# Patient Record
Sex: Male | Born: 1935
Health system: Southern US, Community
[De-identification: ages and names within clinical notes are randomized; demographics above are authoritative.]

## PROBLEM LIST (undated history)

## (undated) DIAGNOSIS — N189 Chronic kidney disease, unspecified: Secondary | ICD-10-CM

## (undated) DIAGNOSIS — N19 Unspecified kidney failure: Secondary | ICD-10-CM

## (undated) DIAGNOSIS — S42002A Fracture of unspecified part of left clavicle, initial encounter for closed fracture: Secondary | ICD-10-CM

## (undated) DIAGNOSIS — M1990 Unspecified osteoarthritis, unspecified site: Secondary | ICD-10-CM

## (undated) DIAGNOSIS — I447 Left bundle-branch block, unspecified: Secondary | ICD-10-CM

## (undated) DIAGNOSIS — E782 Mixed hyperlipidemia: Secondary | ICD-10-CM

## (undated) DIAGNOSIS — M419 Scoliosis, unspecified: Secondary | ICD-10-CM

## (undated) DIAGNOSIS — I509 Heart failure, unspecified: Secondary | ICD-10-CM

## (undated) DIAGNOSIS — I4891 Unspecified atrial fibrillation: Secondary | ICD-10-CM

## (undated) DIAGNOSIS — E039 Hypothyroidism, unspecified: Secondary | ICD-10-CM

## (undated) DIAGNOSIS — S93402A Sprain of unspecified ligament of left ankle, initial encounter: Secondary | ICD-10-CM

## (undated) DIAGNOSIS — J449 Chronic obstructive pulmonary disease, unspecified: Secondary | ICD-10-CM

## (undated) DIAGNOSIS — I4892 Unspecified atrial flutter: Secondary | ICD-10-CM

## (undated) HISTORY — DX: Chronic kidney disease, unspecified: N18.9

## (undated) HISTORY — DX: Scoliosis, unspecified: M41.9

## (undated) HISTORY — DX: Hypothyroidism, unspecified: E03.9

## (undated) HISTORY — PX: OTHER SURGICAL HISTORY: SHX169

## (undated) HISTORY — DX: Left bundle-branch block, unspecified: I44.7

## (undated) HISTORY — PX: EYE SURGERY: SHX253

## (undated) HISTORY — PX: TONSILLECTOMY: SUR1361

## (undated) HISTORY — DX: Unspecified atrial fibrillation: I48.91

## (undated) HISTORY — DX: Sprain of unspecified ligament of left ankle, initial encounter: S93.402A

## (undated) HISTORY — DX: Fracture of unspecified part of left clavicle, initial encounter for closed fracture: S42.002A

## (undated) HISTORY — DX: Mixed hyperlipidemia: E78.2

## (undated) HISTORY — DX: Heart failure, unspecified: I50.9

## (undated) HISTORY — DX: Unspecified kidney failure: N19

## (undated) HISTORY — DX: Unspecified atrial flutter: I48.92

---

## 1996-09-15 HISTORY — PX: OTHER SURGICAL HISTORY: SHX169

## 2001-07-15 LAB — BASIC METABOLIC PANEL
BUN: 43 — AB (ref 4–21)
CO2: 25 — AB (ref 13–22)
Chloride: 103 (ref 99–108)
Creatinine: 2.2 — AB (ref 0.6–1.3)
Glucose: 101
Potassium: 4.8 (ref 3.4–5.3)
Sodium: 140 (ref 137–147)

## 2001-07-15 LAB — CBC AND DIFFERENTIAL
HCT: 33 — AB (ref 41–53)
Hemoglobin: 10.8 — AB (ref 13.5–17.5)
Platelets: 388 (ref 150–399)
WBC: 9.4

## 2001-07-15 LAB — COMPREHENSIVE METABOLIC PANEL: Calcium: 8.3 — AB (ref 8.7–10.7)

## 2001-07-15 LAB — CBC: RBC: 3.43 — AB (ref 3.87–5.11)

## 2002-11-30 ENCOUNTER — Encounter: Payer: Self-pay | Admitting: Internal Medicine

## 2002-11-30 ENCOUNTER — Ambulatory Visit (HOSPITAL_COMMUNITY): Admission: RE | Admit: 2002-11-30 | Discharge: 2002-11-30 | Payer: Self-pay | Admitting: Internal Medicine

## 2003-08-30 ENCOUNTER — Ambulatory Visit (HOSPITAL_COMMUNITY): Admission: RE | Admit: 2003-08-30 | Discharge: 2003-08-30 | Payer: Self-pay | Admitting: Internal Medicine

## 2006-09-15 HISTORY — PX: LAPAROSCOPIC CHOLECYSTECTOMY: SUR755

## 2006-12-09 ENCOUNTER — Encounter: Payer: Self-pay | Admitting: Cardiology

## 2006-12-09 ENCOUNTER — Ambulatory Visit: Payer: Self-pay | Admitting: Cardiology

## 2006-12-09 ENCOUNTER — Inpatient Hospital Stay (HOSPITAL_COMMUNITY): Admission: EM | Admit: 2006-12-09 | Discharge: 2006-12-13 | Payer: Self-pay | Admitting: Cardiology

## 2006-12-12 ENCOUNTER — Encounter: Payer: Self-pay | Admitting: Cardiology

## 2006-12-12 ENCOUNTER — Encounter (INDEPENDENT_AMBULATORY_CARE_PROVIDER_SITE_OTHER): Payer: Self-pay | Admitting: *Deleted

## 2006-12-13 ENCOUNTER — Encounter: Payer: Self-pay | Admitting: Cardiology

## 2006-12-15 ENCOUNTER — Ambulatory Visit: Payer: Self-pay | Admitting: Internal Medicine

## 2007-04-29 ENCOUNTER — Encounter: Payer: Self-pay | Admitting: Cardiology

## 2007-06-14 ENCOUNTER — Ambulatory Visit (HOSPITAL_COMMUNITY): Admission: RE | Admit: 2007-06-14 | Discharge: 2007-06-14 | Payer: Self-pay | Admitting: Internal Medicine

## 2009-05-03 ENCOUNTER — Encounter: Payer: Self-pay | Admitting: Cardiology

## 2010-05-05 ENCOUNTER — Encounter: Payer: Self-pay | Admitting: Cardiology

## 2010-05-06 ENCOUNTER — Encounter: Payer: Self-pay | Admitting: Cardiology

## 2010-09-26 ENCOUNTER — Encounter: Payer: Self-pay | Admitting: Cardiology

## 2010-10-16 ENCOUNTER — Encounter: Payer: Self-pay | Admitting: Cardiology

## 2010-10-21 ENCOUNTER — Encounter: Payer: Self-pay | Admitting: Cardiology

## 2010-10-24 ENCOUNTER — Encounter: Payer: Self-pay | Admitting: Cardiology

## 2010-10-25 ENCOUNTER — Encounter (HOSPITAL_COMMUNITY): Payer: Self-pay

## 2010-10-25 ENCOUNTER — Other Ambulatory Visit (HOSPITAL_COMMUNITY): Payer: Self-pay | Admitting: Internal Medicine

## 2010-10-25 ENCOUNTER — Encounter: Payer: Self-pay | Admitting: Cardiology

## 2010-10-25 ENCOUNTER — Ambulatory Visit (HOSPITAL_COMMUNITY)
Admission: RE | Admit: 2010-10-25 | Discharge: 2010-10-25 | Disposition: A | Discharge status: Home / Self Care | Payer: No Typology Code available for payment source | Source: Ambulatory Visit | Attending: Internal Medicine | Admitting: Internal Medicine

## 2010-10-25 DIAGNOSIS — R0602 Shortness of breath: Secondary | ICD-10-CM

## 2010-10-25 DIAGNOSIS — R059 Cough, unspecified: Secondary | ICD-10-CM | POA: Insufficient documentation

## 2010-10-25 DIAGNOSIS — R05 Cough: Secondary | ICD-10-CM | POA: Insufficient documentation

## 2010-10-25 DIAGNOSIS — R918 Other nonspecific abnormal finding of lung field: Secondary | ICD-10-CM | POA: Insufficient documentation

## 2010-10-29 ENCOUNTER — Encounter: Payer: Self-pay | Admitting: Cardiology

## 2010-10-29 DIAGNOSIS — I509 Heart failure, unspecified: Secondary | ICD-10-CM

## 2010-11-06 NOTE — Letter (Signed)
Summary: Discharge Summary/ [facility]  Discharge Summary/ [place]   Imported By: Bartholomew Boards 10/31/2010 16:31:50  _____________________________________________________________________  External Attachment:    Type:   Image     Comment:   External Document

## 2010-11-06 NOTE — Letter (Signed)
Summary: External Correspondence/ OFFICE VISIT DR. [facility]  External Correspondence/ OFFICE VISIT DR. FAGAN   Imported By: Bartholomew Boards 10/31/2010 16:08:44  _____________________________________________________________________  External Attachment:    Type:   Image     Comment:   External Document

## 2010-11-06 NOTE — Letter (Signed)
Summary: External Correspondence/ OFFICE VISIT DR. [facility]  External Correspondence/ OFFICE VISIT DR. FAGAN   Imported By: Bartholomew Boards 10/31/2010 16:12:12  _____________________________________________________________________  External Attachment:    Type:   Image     Comment:   External Document

## 2010-11-06 NOTE — Letter (Signed)
Summary: External Correspondence/ OFFICE VISIT DR. [facility]  External Correspondence/ OFFICE VISIT DR. FAGAN   Imported By: Bartholomew Boards 10/31/2010 16:09:50  _____________________________________________________________________  External Attachment:    Type:   Image     Comment:   External Document

## 2010-11-06 NOTE — Letter (Signed)
Summary: External Correspondence/ OFFICE VISIT DR. [facility]  External Correspondence/ OFFICE VISIT DR. FAGAN   Imported By: Bartholomew Boards 10/31/2010 16:11:00  _____________________________________________________________________  External Attachment:    Type:   Image     Comment:   External Document

## 2010-11-06 NOTE — Letter (Signed)
Summary: External Correspondence/ OFFICE VISIT DR. [facility]  External Correspondence/ OFFICE VISIT DR. FAGAN   Imported By: Bartholomew Boards 10/31/2010 16:13:46  _____________________________________________________________________  External Attachment:    Type:   Image     Comment:   External Document

## 2010-11-08 ENCOUNTER — Encounter: Payer: Self-pay | Admitting: Cardiology

## 2010-11-08 ENCOUNTER — Ambulatory Visit (INDEPENDENT_AMBULATORY_CARE_PROVIDER_SITE_OTHER): Payer: No Typology Code available for payment source | Admitting: Cardiology

## 2010-11-08 DIAGNOSIS — I429 Cardiomyopathy, unspecified: Secondary | ICD-10-CM

## 2010-11-08 DIAGNOSIS — I5023 Acute on chronic systolic (congestive) heart failure: Secondary | ICD-10-CM | POA: Insufficient documentation

## 2010-11-08 DIAGNOSIS — E782 Mixed hyperlipidemia: Secondary | ICD-10-CM | POA: Insufficient documentation

## 2010-11-08 DIAGNOSIS — I447 Left bundle-branch block, unspecified: Secondary | ICD-10-CM | POA: Insufficient documentation

## 2010-11-12 ENCOUNTER — Encounter: Payer: Self-pay | Admitting: Cardiology

## 2010-11-12 NOTE — Assessment & Plan Note (Signed)
Summary: est - last saw wall in 08 per Dr. Willey Blade -srs   Visit Type:  New patient visit Primary Provider:  Dr. Asencion Noble   History of Present Illness: 75 year old male presents to establish cardiology followup. He has followed recently with Dr. Willey Blade with complaints of shortness of breath on exertion as well as lower extremity edema with weight gain. He was referred for a chest x-ray which revealed vascular congestion and a small right pleural effusion. BNP was elevated at 1030. He was started on Atacand and Lasix, as followup echocardiogram demonstrated significant left ventricular dysfunction, outlined below.  Previous echocardiogram from 2008 demonstrated LVEF of approximately 55%. It sounds as if symptoms have been present over the last 4 weeks. States that he had a "cold" around the time that things began. He is not experiencing any chest pain. He is wearing lower extremity compression stockings, and his edema is generally getting better, although has not resolved. He has NYHA class 2-3 dyspnea on exertion, using a wheelchair if he has to go any distance.  No recent followup labs.  Preventive Screening-Counseling & Management  Alcohol-Tobacco     Smoking Status: never  Current Medications (verified): 1)  Levothyroxine Sodium 150 Mcg Tabs (Levothyroxine Sodium) .... Take 1 Tablet By Mouth Once A Day 2)  Doxazosin Mesylate 2 Mg Tabs (Doxazosin Mesylate) .... Take 1 Tablet By Mouth Once A Day 3)  Multivitamins  Tabs (Multiple Vitamin) .... Take 1 Tablet By Mouth Once A Day 4)  Aspir-Low 81 Mg Tbec (Aspirin) .... Take 1 Tablet By Mouth Once A Day' 5)  Atacand 16 Mg Tabs (Candesartan Cilexetil) .... Take 1 Tablet By Mouth Once A Day 6)  Furosemide 40 Mg Tabs (Furosemide) .... Take 1 Tablet By Mouth Once A Day  Allergies (verified): No Known Drug Allergies  Past History:  Family History: Last updated: 11/08/2010 Significant for stroke in his mother who died at age 4 and diabetes and  heart disease in his father who died at age 28.  He has other siblings without obvious premature cardiovascular disease.  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  Alcohol Use - no Drug Use - no  Past Medical History: Hypothyroidism Congestive Heart Failure - dilated cardiomyopathy Hyperlipidemia Scoliosis  Past Surgical History: Laparoscopic cholecystectomy Tonsillectomy Right inguinal herniorrhaphy  Family History: Significant for stroke in his mother who died at age 64 and diabetes and heart disease in his father who died at age 69.  He has other siblings without obvious premature cardiovascular disease.  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  Alcohol Use - no Drug Use - no  Review of Systems       The patient complains of dyspnea on exertion and peripheral edema.  The patient denies anorexia, fever, weight loss, chest pain, syncope, prolonged cough, hemoptysis, melena, hematochezia, and severe indigestion/heartburn.         No palpitations. Breathlessness seems to occur in spells. No recent falls. Stable appetite. Otherwise reviewed and negative.  Vital Signs:  Patient profile:   75 year old male Height:      68 inches Weight:      188 pounds BMI:     28.69 Pulse rate:   89 / minute BP sitting:   103 / 69  (right arm) Cuff size:   regular  Vitals Entered By: Lovina Reach, LPN (February 24, [id number] 1:00 PM) Is Patient  Diabetic? No   Physical Exam  Additional Exam:  Overweight male in no acute distress. HEENT: Conjunctivae and lids normal, oropharynx clear. Neck: Supple, mildly elevated JVP, no bruits, no thyromegaly. Lungs: Clear to auscultation, diminished at the bases, no wheezing, not labored at rest. Cardiac: Indistinct PMI with distant heart sounds, no S3. Abdomen: Obese, protuberant, bowel sounds present, nontender. Skin: Warm and dry. Extremities:  2-3+ lower extremity edema, compression hose in place, distal pulses diminished. Musculoskeletal: No kyphosis. Neuropsychiatric: Alert and oriented x3.   Echocardiogram  Procedure date:  10/29/2010  Findings:      Mildly dilated LV with global hypokinesis and LVEF less than 20%. Septal dyskinesis possibly related to bundle branch block. Mild left atrial enlargement, mild mitral regurgitation with mildly thickened valve, mild aortic regurgitation, mildly dilated RV with mildly reduced function, mild right atrial enlargement, mild tricuspid regurgitation, RVSP 34 mm mercury.  CXR  Procedure date:  10/25/2010  Findings:      IMPRESSION:    1.  Increased cardiomegaly with new pulmonary vascular congestion   and a small right effusion, all suggestive of mild heart failure.   2.  Ill-defined density at the right base medially which I suspect   is vascular in origin but follow-up PA chest in 1 week is   recommended to exclude a mass.  EKG  Procedure date:  10/25/2010  Findings:      Sinus tachycardia at 102 beats per minute with incomplete left bundle branch block pattern.  Impression & Recommendations:  Problem # 1:  ACUTE ON CHRONIC SYSTOLIC HEART FAILURE (99991111)  Onset is somewhat difficult to define, however seems to be over the last month at least, perhaps in association with a recent "cold." It could be that he has a viral cardiomyopathy, as there is evidence of global hypokinesis and significant left ventricular dysfunction. I reviewed this with the patient and his family member present. Plan at this point will be to increase Lasix to 40 mg p.o. b.i.d., add a potassium supplement,and reduce Atacand to 8 mg daily so to avoid hypotension. Continue use of lower extremity compression stockings. I would eventually like to add a low-dose beta blocker once his volume status has improved. I have spoken to Dr. Willey Blade about Mr. Loose, and plan at this point will be conservative  medical therapy. We will obtain a CMET and BNP today for baseline, with followup BMET at his office visit in 2 weeks.  His updated medication list for this problem includes:    Aspir-low 81 Mg Tbec (Aspirin) .[name]... Take 1 tablet by mouth once a day'    Atacand 16 Mg Tabs (Candesartan cilexetil) .[name]... Take 1/2 tablet by mouth once a day    Furosemide 40 Mg Tabs (Furosemide) .[name]... Take 1 tablet by mouth two times a day  Problem # 2:  CARDIOMYOPATHY, SECONDARY (ICD-425.9)  Dilated, probably nonischemic cardiomyopathy.  His updated medication list for this problem includes:    Aspir-low 81 Mg Tbec (Aspirin) .[name]... Take 1 tablet by mouth once a day'    Atacand 16 Mg Tabs (Candesartan cilexetil) .[name]... Take 1/2 tablet by mouth once a day    Furosemide 40 Mg Tabs (Furosemide) .[name]... Take 1 tablet by mouth two times a day  His updated medication list for this problem includes:    Aspir-low 81 Mg Tbec (Aspirin) .[name]... Take 1 tablet by mouth once a day'    Atacand 16 Mg Tabs (Candesartan cilexetil) .[name]... Take 1/2 tablet by mouth once a day  Furosemide 40 Mg Tabs (Furosemide) .[name]... Take 1 tablet by mouth two times a day  Problem # 3:  LBBB (ICD-426.3)  Septal dyssynergy noted, consistent with bundle branch block.  His updated medication list for this problem includes:    Aspir-low 81 Mg Tbec (Aspirin) .[name]... Take 1 tablet by mouth once a day'  Problem # 4:  MIXED HYPERLIPIDEMIA (ICD-272.2)  Based on history reviewed. Plan to followup fasting lipid profile eventually.  Other Orders: T-Comprehensive Metabolic Panel (A999333) T-BNP  (B Natriuretic Peptide) (0000000) T-Basic Metabolic Panel (99991111) T-BNP  (B Natriuretic Peptide) [id number])  Patient Instructions: 1)  Follow up with Dr. Domenic Polite on  [place], November 22, 2010 AT 8:40AM. 2)  Decrease Atacand to 1/2 tablet once daily.  3)  Increase Lasix (furosemide) to 40mg  by mouth two times a day. 4)  Start Potassium 10 mEq by mouth  once daily. 5)  Your physician recommends that you go to [facility] for lab work: DO [place] 2 WEEKS BEFORE APPT. Prescriptions: POTASSIUM CHLORIDE CR 10 MEQ CR-CAPS (POTASSIUM CHLORIDE) Take one tablet by mouth daily  #30 x 6   Entered by:   Gurney Maxin, RN, BSN   Authorized by:   Beckie Salts, MD, [facility]   Signed by:   Gurney Maxin, RN, BSN on 11/08/2010   Method used:   Electronically to        [facility] (retail)       [address], [place]  [address]       Ph: [phone]       Fax: [phone]   RxID:   [id number] FUROSEMIDE 40 MG TABS (FUROSEMIDE) Take 1 tablet by mouth two times a day  #60 x 6   Entered by:   Gurney Maxin, RN, BSN   Authorized by:   Beckie Salts, MD, [facility]   Signed by:   Gurney Maxin, RN, BSN on 11/08/2010   Method used:   Electronically to        [facility] (retail)       [address]       [place], [place]  [address]       Ph: [phone]       Fax: [phone]   RxID:   [id number]

## 2010-11-12 NOTE — Op Note (Signed)
Summary: Operative Report/ DORA BRODIE  Operative Report/ DORA BRODIE   Imported By: Bartholomew Boards 11/08/2010 10:35:59  _____________________________________________________________________  External Attachment:    Type:   Image     Comment:   External Document

## 2010-11-12 NOTE — Letter (Signed)
Summary: External Correspondence/ [place]  External Correspondence/ [place]   Imported By: Bartholomew Boards 11/08/2010 10:40:14  _____________________________________________________________________  External Attachment:    Type:   Image     Comment:   External Document

## 2010-11-12 NOTE — Op Note (Signed)
Summary: Operative Report/  DR. Margot Chimes  Operative Report/  DR. Margot Chimes   Imported By: Bartholomew Boards 11/08/2010 10:37:32  _____________________________________________________________________  External Attachment:    Type:   Image     Comment:   External Document

## 2010-11-12 NOTE — Consult Note (Signed)
Summary: Consultation Report/ [facility]  Consultation Report/ [facility]   Imported By: Bartholomew Boards 11/08/2010 10:31:49  _____________________________________________________________________  External Attachment:    Type:   Image     Comment:   External Document

## 2010-11-13 ENCOUNTER — Encounter: Payer: Self-pay | Admitting: Cardiology

## 2010-11-13 DIAGNOSIS — I5023 Acute on chronic systolic (congestive) heart failure: Secondary | ICD-10-CM

## 2010-11-14 ENCOUNTER — Encounter: Payer: Self-pay | Admitting: Cardiology

## 2010-11-17 ENCOUNTER — Encounter: Payer: Self-pay | Admitting: Cardiology

## 2010-11-20 ENCOUNTER — Encounter: Payer: Self-pay | Admitting: Cardiology

## 2010-11-21 ENCOUNTER — Encounter: Payer: Self-pay | Admitting: Cardiology

## 2010-11-22 ENCOUNTER — Encounter: Payer: Self-pay | Admitting: Cardiology

## 2010-11-22 ENCOUNTER — Ambulatory Visit: Payer: No Typology Code available for payment source | Admitting: Cardiology

## 2010-11-26 NOTE — Consult Note (Signed)
Summary: [place] NOTE-DR.DEGENT  [place] NOTE-DR.DEGENT   Imported By: Delfino Lovett 11/21/2010 16:38:01  _____________________________________________________________________  External Attachment:    Type:   Image     Comment:   External Document

## 2010-11-26 NOTE — Consult Note (Signed)
Summary: CARDIOLOGY CONSULT/ [place] [facility]/ [place]   Imported By: Delfino Lovett 11/21/2010 16:36:14  _____________________________________________________________________  External Attachment:    Type:   Image     Comment:   External Document

## 2010-11-28 ENCOUNTER — Telehealth: Payer: Self-pay | Admitting: *Deleted

## 2010-11-28 ENCOUNTER — Encounter: Payer: Self-pay | Admitting: Cardiology

## 2010-12-02 ENCOUNTER — Encounter: Payer: Self-pay | Admitting: Cardiology

## 2010-12-02 ENCOUNTER — Telehealth: Payer: Self-pay | Admitting: *Deleted

## 2010-12-02 ENCOUNTER — Encounter (INDEPENDENT_AMBULATORY_CARE_PROVIDER_SITE_OTHER): Payer: No Typology Code available for payment source | Admitting: Cardiology

## 2010-12-02 DIAGNOSIS — I5022 Chronic systolic (congestive) heart failure: Secondary | ICD-10-CM

## 2010-12-02 DIAGNOSIS — N289 Disorder of kidney and ureter, unspecified: Secondary | ICD-10-CM

## 2010-12-03 NOTE — Progress Notes (Signed)
Summary: LAB ORDERS  Phone Note From Other Clinic Call back at Home Phone [phone]   Caller: Janace Hoard WITH ADVANCED Summary of Call: ANGIE WILL BE GOING TO PATIENT'S HOME TODAY AND WOULD LIKE LAB ORDER CHANGED SO THAT SHE CAN DRAW TODAY INSTEAD OF TOMORROW.[name]  [place] # [phone] Initial call taken by: Cher Nakai,  November 28, 2010 8:04 AM  Follow-up for Phone Call        Angie notified labs can be drawn at visit today.  Follow-up by: Gurney Maxin, RN, BSN,  November 28, 2010 10:28 AM

## 2010-12-06 ENCOUNTER — Encounter: Payer: Self-pay | Admitting: Cardiology

## 2010-12-08 ENCOUNTER — Encounter: Payer: Self-pay | Admitting: Cardiology

## 2010-12-09 ENCOUNTER — Inpatient Hospital Stay (HOSPITAL_COMMUNITY): Payer: No Typology Code available for payment source

## 2010-12-09 ENCOUNTER — Inpatient Hospital Stay (HOSPITAL_COMMUNITY)
Admission: AD | Admit: 2010-12-09 | Discharge: 2010-12-17 | DRG: 286 | Disposition: A | Discharge status: Home / Self Care | Payer: No Typology Code available for payment source | Source: Ambulatory Visit | Attending: Cardiology | Admitting: Cardiology

## 2010-12-09 DIAGNOSIS — I428 Other cardiomyopathies: Secondary | ICD-10-CM | POA: Diagnosis present

## 2010-12-09 DIAGNOSIS — I509 Heart failure, unspecified: Secondary | ICD-10-CM | POA: Diagnosis present

## 2010-12-09 DIAGNOSIS — Z7982 Long term (current) use of aspirin: Secondary | ICD-10-CM

## 2010-12-09 DIAGNOSIS — I5023 Acute on chronic systolic (congestive) heart failure: Secondary | ICD-10-CM

## 2010-12-09 DIAGNOSIS — R5381 Other malaise: Secondary | ICD-10-CM | POA: Diagnosis present

## 2010-12-09 DIAGNOSIS — I4891 Unspecified atrial fibrillation: Secondary | ICD-10-CM | POA: Diagnosis present

## 2010-12-09 DIAGNOSIS — R570 Cardiogenic shock: Secondary | ICD-10-CM | POA: Diagnosis present

## 2010-12-09 DIAGNOSIS — I2789 Other specified pulmonary heart diseases: Secondary | ICD-10-CM | POA: Diagnosis present

## 2010-12-09 DIAGNOSIS — I498 Other specified cardiac arrhythmias: Secondary | ICD-10-CM | POA: Diagnosis present

## 2010-12-09 DIAGNOSIS — F0390 Unspecified dementia without behavioral disturbance: Secondary | ICD-10-CM | POA: Diagnosis present

## 2010-12-09 DIAGNOSIS — N183 Chronic kidney disease, stage 3 unspecified: Secondary | ICD-10-CM | POA: Diagnosis present

## 2010-12-09 DIAGNOSIS — I447 Left bundle-branch block, unspecified: Secondary | ICD-10-CM | POA: Diagnosis present

## 2010-12-09 DIAGNOSIS — E039 Hypothyroidism, unspecified: Secondary | ICD-10-CM | POA: Diagnosis present

## 2010-12-09 LAB — CBC
HCT: 40 % (ref 39.0–52.0)
Hemoglobin: 13.3 g/dL (ref 13.0–17.0)
MCH: 30.4 pg (ref 26.0–34.0)
MCHC: 33.3 g/dL (ref 30.0–36.0)
MCV: 91.5 fL (ref 78.0–100.0)
Platelets: 213 10*3/uL (ref 150–400)
RBC: 4.37 MIL/uL (ref 4.22–5.81)
RDW: 15.1 % (ref 11.5–15.5)
WBC: 8.2 10*3/uL (ref 4.0–10.5)

## 2010-12-09 LAB — CARBOXYHEMOGLOBIN
Carboxyhemoglobin: 0.8 % (ref 0.5–1.5)
Methemoglobin: 0.6 % (ref 0.0–1.5)
O2 Saturation: 41.6 %
Total hemoglobin: 12.8 g/dL — ABNORMAL LOW (ref 13.5–18.0)

## 2010-12-09 LAB — COMPREHENSIVE METABOLIC PANEL
ALT: 53 U/L (ref 0–53)
AST: 37 U/L (ref 0–37)
Albumin: 3.6 g/dL (ref 3.5–5.2)
Alkaline Phosphatase: 82 U/L (ref 39–117)
BUN: 70 mg/dL — ABNORMAL HIGH (ref 6–23)
CO2: 25 mEq/L (ref 19–32)
Calcium: 8.4 mg/dL (ref 8.4–10.5)
Chloride: 95 mEq/L — ABNORMAL LOW (ref 96–112)
Creatinine, Ser: 1.91 mg/dL — ABNORMAL HIGH (ref 0.4–1.5)
GFR calc Af Amer: 42 mL/min — ABNORMAL LOW (ref >60)
GFR calc non Af Amer: 35 mL/min — ABNORMAL LOW (ref >60)
Glucose, Bld: 110 mg/dL — ABNORMAL HIGH (ref 70–99)
Potassium: 4.6 mEq/L (ref 3.5–5.1)
Sodium: 131 mEq/L — ABNORMAL LOW (ref 135–145)
Total Bilirubin: 0.9 mg/dL (ref 0.3–1.2)
Total Protein: 7.3 g/dL (ref 6.0–8.3)

## 2010-12-09 LAB — BRAIN NATRIURETIC PEPTIDE: Pro B Natriuretic peptide (BNP): 985 pg/mL — ABNORMAL HIGH (ref 0.0–100.0)

## 2010-12-09 LAB — MRSA PCR SCREENING: MRSA by PCR: NEGATIVE

## 2010-12-10 LAB — BASIC METABOLIC PANEL
BUN: 61 mg/dL — ABNORMAL HIGH (ref 6–23)
CO2: 25 mEq/L (ref 19–32)
Calcium: 8.5 mg/dL (ref 8.4–10.5)
Chloride: 98 mEq/L (ref 96–112)
Creatinine, Ser: 1.6 mg/dL — ABNORMAL HIGH (ref 0.4–1.5)
GFR calc Af Amer: 52 mL/min — ABNORMAL LOW (ref >60)
GFR calc non Af Amer: 43 mL/min — ABNORMAL LOW (ref >60)
Glucose, Bld: 121 mg/dL — ABNORMAL HIGH (ref 70–99)
Potassium: 3.9 mEq/L (ref 3.5–5.1)
Sodium: 135 mEq/L (ref 135–145)

## 2010-12-10 LAB — CARBOXYHEMOGLOBIN
Carboxyhemoglobin: 1 % (ref 0.5–1.5)
Methemoglobin: 0.4 % (ref 0.0–1.5)
O2 Saturation: 56.5 %
Total hemoglobin: 13 g/dL — ABNORMAL LOW (ref 13.5–18.0)

## 2010-12-11 LAB — BASIC METABOLIC PANEL
BUN: 43 mg/dL — ABNORMAL HIGH (ref 6–23)
CO2: 31 mEq/L (ref 19–32)
Calcium: 8.1 mg/dL — ABNORMAL LOW (ref 8.4–10.5)
Chloride: 100 mEq/L (ref 96–112)
Creatinine, Ser: 1.62 mg/dL — ABNORMAL HIGH (ref 0.4–1.5)
GFR calc Af Amer: 51 mL/min — ABNORMAL LOW (ref >60)
GFR calc non Af Amer: 42 mL/min — ABNORMAL LOW (ref >60)
Glucose, Bld: 119 mg/dL — ABNORMAL HIGH (ref 70–99)
Potassium: 3.7 mEq/L (ref 3.5–5.1)
Sodium: 138 mEq/L (ref 135–145)

## 2010-12-12 LAB — BASIC METABOLIC PANEL
BUN: 31 mg/dL — ABNORMAL HIGH (ref 6–23)
CO2: 29 mEq/L (ref 19–32)
Calcium: 8.6 mg/dL (ref 8.4–10.5)
Creatinine, Ser: 1.47 mg/dL (ref 0.4–1.5)
Glucose, Bld: 106 mg/dL — ABNORMAL HIGH (ref 70–99)
Sodium: 137 mEq/L (ref 135–145)

## 2010-12-12 LAB — CBC
HCT: 40.5 % (ref 39.0–52.0)
MCH: 29.4 pg (ref 26.0–34.0)
MCHC: 32.3 g/dL (ref 30.0–36.0)
MCV: 91 fL (ref 78.0–100.0)
RDW: 15.1 % (ref 11.5–15.5)

## 2010-12-12 LAB — PROTIME-INR
INR: 1.15 (ref 0.00–1.49)
Prothrombin Time: 14.9 seconds (ref 11.6–15.2)

## 2010-12-12 LAB — CARBOXYHEMOGLOBIN: O2 Saturation: 63.2 %

## 2010-12-12 NOTE — Assessment & Plan Note (Signed)
SummaryGillian Bowen [facility] 3/9   Visit Type:  Follow-up Primary Provider:  Dr. Asencion Noble   History of Present Illness: 75 year old male presents for followup. Since I last saw him, he was admitted to [facility] with acute on chronic congestive heart failure associated with volume overload and pleural effusion. He required thoracentesis along with medication adjustments including temporary inotropic therapy. Weight changed from 179 pounds at admission down to 165 pounds at discharge.  Followup lab work from 15 March showed BUN 31, creatinine 1.4, sodium 132, potassium 4.0, BNP 809.  He still has a right-sided PICC line in place, home health nursing is following. He is here with a family member who keeps a close watch on him. Reports compliance with medications, relatively stable weights, reported at "164 " prior to dressing this morning.  We discussed sodium restriction and fluid restriction, following weights daily, also medication compliance. His peripheral edema is significantly improved although not resolved. He still uses compression stockings. No palpitations or syncope.  Current Medications (verified): 1)  Levothyroxine Sodium 150 Mcg Tabs (Levothyroxine Sodium) .... Take 1 Tablet By Mouth Once A Day 2)  Aspir-Low 81 Mg Tbec (Aspirin) .... Take 1 Tablet By Mouth Once A Day' 3)  Furosemide 80 Mg Tabs (Furosemide) .... Take 1 Tablet By Mouth Two Times A Day 4)  Potassium Chloride Crys Cr 20 Meq Cr-Tabs (Potassium Chloride Crys Cr) .... Take 1 Tablet By Mouth Once A Day 5)  Diovan 40 Mg Tabs (Valsartan) .... Take 1/2 Tablet By Mouth Two Times A Day  Allergies (verified): No Known Drug Allergies  Comments:  Nurse/Medical Assistant: The patient's medication bottles and allergies were reviewed with the patient and were updated in the Medication and Allergy Lists.  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   Alcohol Use - no Drug Use - no  Past Medical History: Hypothyroidism Congestive Heart Failure - dilated cardiomyopathy Hyperlipidemia Scoliosis Atrial Fibrillation/Flutter - suboptimal Coumadin candidate, fall risk Renal insufficiency  Review of Systems       The patient complains of dyspnea on exertion and peripheral edema.  The patient denies anorexia, fever, weight gain, chest pain, syncope, melena, hematochezia, and severe indigestion/heartburn.         Otherwise reviewed and negative except as outlined.  Vital Signs:  Patient profile:   75 year old male Height:      68 inches Weight:      168 pounds O2 Sat:      100 % on Room air Pulse rate:   83 / minute BP sitting:   112 / 76  (left arm) Cuff size:   regular  Vitals Entered By: Georgina Peer (December 02, 2010 10:47 AM)  O2 Flow:  Room air  Physical Exam  Additional Exam:  Overweight male in no acute distress. HEENT: Conjunctivae and lids normal, oropharynx clear. Neck: Supple, mildly elevated JVP, no bruits, no thyromegaly. Lungs: Clear to auscultation, diminished at the bases, no wheezing, not labored at rest. Cardiac: Indistinct PMI with distant heart sounds, no S3. Abdomen: Obese, protuberant, bowel sounds present, nontender. Skin: Warm and dry. Extremities: 2+ lower extremity edema, compression hose in place, distal pulses diminished. Musculoskeletal: Significant kyphosis. Neuropsychiatric: Alert and oriented x3.   CXR  Procedure date:  11/20/2010  Findings:      Resolution of CHF with small residual right pleural effusion status post thoracentesis.  Impression & Recommendations:  Problem # 1:  CHRONIC SYSTOLIC HEART FAILURE (123456)  Symptomatically improved, down 20 pounds compared to his original office visit following diuresis and right-sided thoracentesis. Hospital evaluation was reviewed. Patient's renal function and potassium are stable on high dose Lasix. He has home health nursing  followup at this point, and I have discussed the importance of daily weights, sodium and fluid restriction, and medication compliance. BNP has increased somewhat compared to his discharge, although with stable weights and improving lower examination edema, plan to continue present diuretic regimen. I would like to add low-dose Coreg beginning at 3.125 mg p.o. b.i.d. since volume status has improved. A 2 week visit is scheduled. I do not anticipate long-term use of the PICC line, as home inotropic therapy is not being considered at this point. He may well be able to have this line removed over the next week.  The following medications were removed from the medication list:    Atacand 16 Mg Tabs (Candesartan cilexetil) .[name]... Take 1/2 tablet by mouth once a day His updated medication list for this problem includes:    Aspir-low 81 Mg Tbec (Aspirin) .[name]... Take 1 tablet by mouth once a day'    Furosemide 80 Mg Tabs (Furosemide) .[name]... Take 1 tablet by mouth two times a day    Diovan 40 Mg Tabs (Valsartan) .[name]... Take 1/2 tablet by mouth two times a day    Carvedilol 3.125 Mg Tabs (Carvedilol) .[name]... Take one tablet by mouth twice a day  Problem # 2:  CARDIOMYOPATHY, SECONDARY (ICD-425.9)  Probably nonischemic, anticipate conservative medical therapy at this time.  The following medications were removed from the medication list:    Atacand 16 Mg Tabs (Candesartan cilexetil) .[name]... Take 1/2 tablet by mouth once a day His updated medication list for this problem includes:    Aspir-low 81 Mg Tbec (Aspirin) .[name]... Take 1 tablet by mouth once a day'    Furosemide 80 Mg Tabs (Furosemide) .[name]... Take 1 tablet by mouth two times a day    Diovan 40 Mg Tabs (Valsartan) .[name]... Take 1/2 tablet by mouth two times a day    Carvedilol 3.125 Mg Tabs (Carvedilol) .[name]... Take one tablet by mouth twice a day  Patient Instructions: 1)  Follow up with Dr. Domenic Polite on Wed, December 18, 2010 at 1:20pm. 2)  Start Coreg (carvedilol)  3.125mg  two times a day. Prescriptions: CARVEDILOL 3.125 MG TABS (CARVEDILOL) Take one tablet by mouth twice a day  #60 x 6   Entered by:   Gurney Maxin, RN, BSN   Authorized by:   Beckie Salts, MD, [facility]   Signed by:   Gurney Maxin, RN, BSN on 12/02/2010   Method used:   Electronically to        [facility] (retail)       [address]       [place], [place]  [address]       Ph: [phone]       Fax: [phone]   RxID:   [id number]   Handout requested.

## 2010-12-12 NOTE — Letter (Signed)
Summary: [place] D/C DR. Roselind Rily  [place] D/C DR. Roselind Rily   Imported By: Delfino Lovett 12/02/2010 08:31:20  _____________________________________________________________________  External Attachment:    Type:   Image     Comment:   External Document

## 2010-12-12 NOTE — Consult Note (Signed)
Summary: CARDIOLOGY CONSULT/ [place] [facility]/ [place]   Imported By: Delfino Lovett 12/02/2010 08:46:21  _____________________________________________________________________  External Attachment:    Type:   Image     Comment:   External Document

## 2010-12-12 NOTE — Medication Information (Signed)
Summary: [place] D/C MEDICATION SHEET  [place] D/C MEDICATION SHEET   Imported By: Delfino Lovett 12/02/2010 08:50:20  _____________________________________________________________________  External Attachment:    Type:   Image     Comment:   External Document

## 2010-12-12 NOTE — Progress Notes (Signed)
Summary: Home Health  Phone Note Outgoing Call   Call placed by: Gurney Maxin, RN, BSN,  December 02, 2010 11:41 AM Summary of Call: Spoke with Lattie Haw at [facility]. Notified her that we would like nurse to contact us in 1 week to give an update as to the pt's status. Per Dr. Domenic Polite, if pt remains stable, we will set up for PICC line to be removed. Initial call taken by: Gurney Maxin, RN, BSN,  December 02, 2010 11:44 AM

## 2010-12-13 DIAGNOSIS — I4891 Unspecified atrial fibrillation: Secondary | ICD-10-CM

## 2010-12-13 DIAGNOSIS — I509 Heart failure, unspecified: Secondary | ICD-10-CM

## 2010-12-13 LAB — HEPARIN LEVEL (UNFRACTIONATED)
Heparin Unfractionated: 0.42 IU/mL (ref 0.30–0.70)
Heparin Unfractionated: 0.32 IU/mL (ref 0.30–0.70)

## 2010-12-13 LAB — POCT I-STAT 3, ART BLOOD GAS (G3+)
Acid-Base Excess: 4 mmol/L — ABNORMAL HIGH (ref 0.0–2.0)
O2 Saturation: 96 %

## 2010-12-13 LAB — CBC
MCH: 29.9 pg (ref 26.0–34.0)
MCHC: 32.2 g/dL (ref 30.0–36.0)
Platelets: 187 10*3/uL (ref 150–400)
RBC: 4.12 MIL/uL — ABNORMAL LOW (ref 4.22–5.81)

## 2010-12-13 LAB — POCT I-STAT 3, VENOUS BLOOD GAS (G3P V)
Bicarbonate: 27.5 mEq/L — ABNORMAL HIGH (ref 20.0–24.0)
pCO2, Ven: 44.5 mmHg — ABNORMAL LOW (ref 45.0–50.0)
pH, Ven: 7.405 — ABNORMAL HIGH (ref 7.250–7.300)
pH, Ven: 7.407 — ABNORMAL HIGH (ref 7.250–7.300)
pO2, Ven: 33 mmHg (ref 30.0–45.0)

## 2010-12-13 LAB — BASIC METABOLIC PANEL
CO2: 27 mEq/L (ref 19–32)
Calcium: 8.6 mg/dL (ref 8.4–10.5)
Creatinine, Ser: 1.57 mg/dL — ABNORMAL HIGH (ref 0.4–1.5)
GFR calc Af Amer: 53 mL/min — ABNORMAL LOW (ref >60)
Glucose, Bld: 104 mg/dL — ABNORMAL HIGH (ref 70–99)

## 2010-12-14 LAB — PROTIME-INR
INR: 1.18 (ref 0.00–1.49)
Prothrombin Time: 15.2 seconds (ref 11.6–15.2)

## 2010-12-14 LAB — CBC
HCT: 36.4 % — ABNORMAL LOW (ref 39.0–52.0)
Hemoglobin: 11.4 g/dL — ABNORMAL LOW (ref 13.0–17.0)
RDW: 15.6 % — ABNORMAL HIGH (ref 11.5–15.5)
WBC: 6.3 10*3/uL (ref 4.0–10.5)

## 2010-12-14 LAB — BASIC METABOLIC PANEL
BUN: 28 mg/dL — ABNORMAL HIGH (ref 6–23)
Calcium: 8.5 mg/dL (ref 8.4–10.5)
GFR calc non Af Amer: 42 mL/min — ABNORMAL LOW (ref >60)
Potassium: 4.7 mEq/L (ref 3.5–5.1)
Sodium: 136 mEq/L (ref 135–145)

## 2010-12-14 LAB — HEPARIN LEVEL (UNFRACTIONATED): Heparin Unfractionated: 0.36 IU/mL (ref 0.30–0.70)

## 2010-12-15 LAB — CBC
HCT: 34.8 % — ABNORMAL LOW (ref 39.0–52.0)
MCH: 29.2 pg (ref 26.0–34.0)
MCHC: 31.6 g/dL (ref 30.0–36.0)
MCV: 92.3 fL (ref 78.0–100.0)
Platelets: 140 10*3/uL — ABNORMAL LOW (ref 150–400)
RDW: 15.6 % — ABNORMAL HIGH (ref 11.5–15.5)
WBC: 6.2 10*3/uL (ref 4.0–10.5)

## 2010-12-15 LAB — BASIC METABOLIC PANEL
BUN: 29 mg/dL — ABNORMAL HIGH (ref 6–23)
Chloride: 100 mEq/L (ref 96–112)
Creatinine, Ser: 1.57 mg/dL — ABNORMAL HIGH (ref 0.4–1.5)
Glucose, Bld: 106 mg/dL — ABNORMAL HIGH (ref 70–99)

## 2010-12-15 LAB — CARBOXYHEMOGLOBIN
Methemoglobin: 0.7 % (ref 0.0–1.5)
Total hemoglobin: 11.1 g/dL — ABNORMAL LOW (ref 13.5–18.0)

## 2010-12-15 LAB — HEPARIN LEVEL (UNFRACTIONATED): Heparin Unfractionated: 0.48 IU/mL (ref 0.30–0.70)

## 2010-12-15 LAB — PROTIME-INR: Prothrombin Time: 16.7 seconds — ABNORMAL HIGH (ref 11.6–15.2)

## 2010-12-16 LAB — CARBOXYHEMOGLOBIN
Carboxyhemoglobin: 1.6 % — ABNORMAL HIGH (ref 0.5–1.5)
Methemoglobin: 0.6 % (ref 0.0–1.5)
Methemoglobin: 0.4 % (ref 0.0–1.5)
O2 Saturation: 95.1 %
Total hemoglobin: 11.4 g/dL — ABNORMAL LOW (ref 13.5–18.0)

## 2010-12-16 LAB — CBC
HCT: 34.6 % — ABNORMAL LOW (ref 39.0–52.0)
Hemoglobin: 11 g/dL — ABNORMAL LOW (ref 13.0–17.0)
MCV: 93.3 fL (ref 78.0–100.0)
RBC: 3.71 MIL/uL — ABNORMAL LOW (ref 4.22–5.81)
RDW: 15.7 % — ABNORMAL HIGH (ref 11.5–15.5)
WBC: 7.1 10*3/uL (ref 4.0–10.5)

## 2010-12-16 LAB — PROTIME-INR
INR: 1.69 — ABNORMAL HIGH (ref 0.00–1.49)
Prothrombin Time: 20.1 seconds — ABNORMAL HIGH (ref 11.6–15.2)

## 2010-12-16 LAB — BASIC METABOLIC PANEL
BUN: 27 mg/dL — ABNORMAL HIGH (ref 6–23)
Creatinine, Ser: 1.5 mg/dL (ref 0.4–1.5)
GFR calc non Af Amer: 46 mL/min — ABNORMAL LOW (ref >60)
Glucose, Bld: 110 mg/dL — ABNORMAL HIGH (ref 70–99)
Potassium: 4.2 mEq/L (ref 3.5–5.1)

## 2010-12-16 LAB — HEPARIN LEVEL (UNFRACTIONATED): Heparin Unfractionated: 0.38 IU/mL (ref 0.30–0.70)

## 2010-12-17 ENCOUNTER — Encounter: Payer: Self-pay | Admitting: Cardiology

## 2010-12-17 LAB — CARBOXYHEMOGLOBIN
Carboxyhemoglobin: 1.5 % (ref 0.5–1.5)
Methemoglobin: 0.7 % (ref 0.0–1.5)

## 2010-12-17 LAB — BASIC METABOLIC PANEL
Calcium: 8.2 mg/dL — ABNORMAL LOW (ref 8.4–10.5)
GFR calc Af Amer: 58 mL/min — ABNORMAL LOW (ref >60)
GFR calc non Af Amer: 48 mL/min — ABNORMAL LOW (ref >60)
Potassium: 4.3 mEq/L (ref 3.5–5.1)
Sodium: 135 mEq/L (ref 135–145)

## 2010-12-17 LAB — PROTIME-INR
INR: 2.68 — ABNORMAL HIGH (ref 0.00–1.49)
Prothrombin Time: 28.6 seconds — ABNORMAL HIGH (ref 11.6–15.2)

## 2010-12-17 LAB — CBC
Hemoglobin: 10.7 g/dL — ABNORMAL LOW (ref 13.0–17.0)
MCHC: 31.6 g/dL (ref 30.0–36.0)
RDW: 15.7 % — ABNORMAL HIGH (ref 11.5–15.5)
WBC: 7.6 10*3/uL (ref 4.0–10.5)

## 2010-12-18 ENCOUNTER — Ambulatory Visit: Payer: No Typology Code available for payment source | Admitting: Cardiology

## 2010-12-19 ENCOUNTER — Other Ambulatory Visit: Payer: Self-pay | Admitting: Internal Medicine

## 2010-12-19 ENCOUNTER — Ambulatory Visit (INDEPENDENT_AMBULATORY_CARE_PROVIDER_SITE_OTHER): Payer: Self-pay | Admitting: Internal Medicine

## 2010-12-19 DIAGNOSIS — I429 Cardiomyopathy, unspecified: Secondary | ICD-10-CM

## 2010-12-19 DIAGNOSIS — I5022 Chronic systolic (congestive) heart failure: Secondary | ICD-10-CM

## 2010-12-19 DIAGNOSIS — I4892 Unspecified atrial flutter: Secondary | ICD-10-CM | POA: Insufficient documentation

## 2010-12-19 DIAGNOSIS — Z7901 Long term (current) use of anticoagulants: Secondary | ICD-10-CM

## 2010-12-19 LAB — POCT INR: INR: 2.2

## 2010-12-19 MED ORDER — VALSARTAN 40 MG PO TABS: ORAL_TABLET | ORAL | Status: DC

## 2010-12-23 ENCOUNTER — Encounter: Payer: Self-pay | Admitting: Cardiology

## 2010-12-23 ENCOUNTER — Ambulatory Visit (INDEPENDENT_AMBULATORY_CARE_PROVIDER_SITE_OTHER): Payer: Self-pay | Admitting: Cardiology

## 2010-12-23 DIAGNOSIS — I4891 Unspecified atrial fibrillation: Secondary | ICD-10-CM

## 2010-12-23 NOTE — Patient Instructions (Signed)
Continue 3mg s daily. Recheck on Monday.  Home Health nurse given instructions for redraw on 12/30/10 and dosing instructions.  [place] nurse to call patient's brother with updated information.

## 2010-12-26 ENCOUNTER — Ambulatory Visit (INDEPENDENT_AMBULATORY_CARE_PROVIDER_SITE_OTHER): Payer: No Typology Code available for payment source | Admitting: Internal Medicine

## 2010-12-26 ENCOUNTER — Encounter: Payer: Self-pay | Admitting: Internal Medicine

## 2010-12-26 VITALS — BP 106/58 | HR 53 | Resp 18 | Ht 66.0 in | Wt 159.4 lb

## 2010-12-26 DIAGNOSIS — I4892 Unspecified atrial flutter: Secondary | ICD-10-CM

## 2010-12-26 DIAGNOSIS — I5022 Chronic systolic (congestive) heart failure: Secondary | ICD-10-CM

## 2010-12-26 LAB — CBC WITH DIFFERENTIAL/PLATELET
Basophils Absolute: 0.1 10*3/uL (ref 0.0–0.1)
Eosinophils Absolute: 0.3 10*3/uL (ref 0.0–0.7)
MCHC: 33.5 g/dL (ref 30.0–36.0)
MCV: 92.7 fl (ref 78.0–100.0)
Monocytes Absolute: 0.8 10*3/uL (ref 0.1–1.0)
Neutrophils Relative %: 66 % (ref 43.0–77.0)
Platelets: 307 10*3/uL (ref 150.0–400.0)
RDW: 17.3 % — ABNORMAL HIGH (ref 11.5–14.6)
WBC: 6.8 10*3/uL (ref 4.5–10.5)

## 2010-12-26 LAB — BASIC METABOLIC PANEL WITH GFR
BUN: 35 mg/dL — ABNORMAL HIGH (ref 6–23)
CO2: 28 meq/L (ref 19–32)
Calcium: 8.7 mg/dL (ref 8.4–10.5)
Chloride: 96 meq/L (ref 96–112)
Creatinine, Ser: 1.9 mg/dL — ABNORMAL HIGH (ref 0.4–1.5)
GFR: 38.34 mL/min — ABNORMAL LOW
Glucose, Bld: 75 mg/dL (ref 70–99)
Potassium: 4.7 meq/L (ref 3.5–5.1)
Sodium: 138 meq/L (ref 135–145)

## 2010-12-26 LAB — BRAIN NATRIURETIC PEPTIDE: Pro B Natriuretic peptide (BNP): 1490.7 pg/mL — ABNORMAL HIGH (ref 0.0–100.0)

## 2010-12-26 MED ORDER — VALSARTAN 40 MG PO TABS: 40.0000 mg | ORAL_TABLET | Freq: Two times a day (BID) | ORAL | Status: DC

## 2010-12-26 NOTE — Assessment & Plan Note (Addendum)
Overall much improved but still with some edema. Weight coming down nicely on current diuretic regimen so will not titrate up. Will increase Diovan to 40 BID. Check labs today. HR too low to increase b-blocker. Will change stockings to thigh-high and may even need to consider wrapping L leg due to venous insufficiency at [facility]. I suspect we will need to consider device therapy at least to permit b-blocker titration and resynchronize. Long talk with patient and brother [name] about situation.

## 2010-12-26 NOTE — Assessment & Plan Note (Signed)
Maintaining SR. Continue amiodarone and coumadin.

## 2010-12-26 NOTE — Progress Notes (Signed)
HPI:  [name] is a 75 y/o male with h/o cognitive impairment, renal insufficiency, severe CHF due to NICM (EF 15-20%), atrial flutter (s/p DC-CV in April 2012) and LBBB.   He was recently hospitalized with low-output HF and volume overload. Also found to be in atrial flutter. Was treated with milrinone and IV lasix and also underwent RHC and DC-CV. (we discussed ablation but given low output - we opted for DC-CV initially).   Coronary angiograms showed insignifcant CAD. Right heart cath (on milrinone) showed a right atrial pressure of about 12, PA pressure was 45/79 with a mean of 27, pulmonary capillary wedge pressure was 12, LV pressure was 85/5 with an EDP of about 8.  Fick cardiac output was 4.4 with a cardiac index of2.3.  Pulmonary artery saturations were 61 and 63%.  Pulmonary vascular resistance was 3.1 Woods units. After his catheterization, he then underwent TEE-guided cardioversion that showed an EF of about 15-20%.  There was no evidence of left atrial appendage thrombus.  He was successfully cardioverted with 1 shock.  He was started on amiodarone to help maintain sinus rhythm.  Given his LBBB we discussed the possibility of BiV-ICD but decided to defer that until we saw how he would do af cardiversion and HF tune-up/  He returns for post-hospital f/u. Doing fairly well. Uses a walker to steady himself. Denies dyspnea. No PND or orthopnea. No palpitations. No bleeding with coumadin. Is having edema L > R. Home health nurse coming 2x/week. Weight down almost 10 pounds since coming home. (164-> 155lbs)  ROS: All systems negative except as listed in HPI, PMH and Problem List.  Past Medical History  Diagnosis Date  . Cardiomyopathy     Nonischemic, LVEF 20%  . Left bundle branch block   . Chronic renal insufficiency   . Hypothyroidism   . Scoliosis   . Mixed hyperlipidemia     Current Outpatient Prescriptions  Medication Sig Dispense Refill  . amiodarone (PACERONE) 200 MG tablet  Take 200 mg by mouth 2 (two) times daily.        [name] aspirin 81 MG tablet Take 81 mg by mouth daily.        . carvedilol (COREG) 3.125 MG tablet Take 3.125 mg by mouth 2 (two) times daily with a meal.        . furosemide (LASIX) 80 MG tablet Take 40 mg by mouth 2 (two) times daily.       . hydrALAZINE (APRESOLINE) 25 MG tablet Take 12.5 mg by mouth 3 (three) times daily.        . isosorbide mononitrate (IMDUR) 30 MG 24 hr tablet Take 30 mg by mouth daily.        [name] levothyroxine (SYNTHROID, LEVOTHROID) 150 MCG tablet Take 150 mcg by mouth daily.        [name] spironolactone (ALDACTONE) 25 MG tablet Take 25 mg by mouth daily.        . valsartan (DIOVAN) 40 MG tablet 40 mg daily.       [name] warfarin (COUMADIN) 3 MG tablet Take 3 mg by mouth as directed.        [name] DISCONTD: potassium chloride SA (K-DUR,KLOR-CON) 20 MEQ tablet Take 20 mEq by mouth daily.        [name] DISCONTD: valsartan (DIOVAN) 40 MG tablet Take 1/2 tablet daily  30 tablet  3     PHYSICAL EXAM: Filed Vitals:   12/26/10 1007  BP: 106/58  Pulse: 53  Resp: 18  General:  Well appearing. No resp difficulty. Sitting in W-C Kyphotic HEENT: normal Neck: supple. JVP 10. Carotids 2+ bilaterally; no bruits. No lymphadenopathy or thryomegaly appreciated. Cor: PMI laterally displace. Brady regular No rubs, gallops or murmurs. Lungs: clear Abdomen: soft, nontender, nondistended. No hepatosplenomegaly. No bruits or masses. Good bowel sounds. Extremities: no cyanosis, clubbing, rash. Tr-1+ edema on R. 3+ edema on L with healing wound.  Neuro: alert & orientedx3, cranial nerves grossly intact. Moves all 4 extremities w/o difficulty. Affect pleasant.    ECG: Sinus brady 53 1avb (228) LBBB (QRS 150)   ASSESSMENT & PLAN:

## 2010-12-26 NOTE — Assessment & Plan Note (Signed)
Overall much improved but still with some edema. Weight coming down nicely on current diuretic regimen so will not titrate up. Will increase Diovan to 40 BID. Check labs today. HR too low to increase b-blocker. Will change stockings to thigh-high and may even need to consider wrapping L leg due to venous insufficiency at [facility]. I suspect we will need to consider device therapy at least to permit b-blocker titration and resynchronize.

## 2010-12-26 NOTE — Patient Instructions (Signed)
Increase Diovan to 40mg  Twice daily  Labs today Your physician recommends that you schedule a follow-up appointment in: 2 weeks

## 2010-12-30 ENCOUNTER — Ambulatory Visit: Payer: Self-pay | Admitting: Cardiology

## 2010-12-30 LAB — POCT INR: INR: 3.4

## 2011-01-06 ENCOUNTER — Ambulatory Visit: Payer: Self-pay

## 2011-01-06 LAB — POCT INR: INR: 3.2

## 2011-01-10 NOTE — H&P (Signed)
NAMEJERRELLE, [name]              ACCOUNT NO.:  [id number]  MEDICAL RECORD NO.:  [id number]           PATIENT TYPE:  I  LOCATION:  2607                         FACILITY:  [facility]  PHYSICIAN:  Minus Breeding, MD, FACCDATE OF BIRTH:  1935-09-30  DATE OF ADMISSION:  12/09/2010 DATE OF DISCHARGE:                             HISTORY & PHYSICAL   PRIMARY CARE PHYSICIAN:  Paula Compton. Willey Blade, MD  CARDIOLOGIST:  Satira Sark, MD  REASON FOR PRESENTATION:  Evaluate the patient with acute on chronic congestive heart failure.  HISTORY OF PRESENT ILLNESS:  The patient is a very pleasant 75 year old gentleman.  He has a history of a nonischemic cardiomyopathy.  He was seen in the office last on December 02, 2010 after a 10-day hospitalization at [facility] earlier in the month.  He has a probable nonobstructive cardiomyopathy with an EF of about 20% by recent echo.  He was diuresed in the hospital and had thoracentesis for pleural effusions.  He was sent home with a PICC line.  He describes intermittent IV Lasix administered per home health nursing as needed.  Apparently he is compliant with salt and weights.  He keeps his feet elevated.  When he last saw Dr. Domenic Polite, his volume seem to be somewhat stable.  He had a creatinine of 1.4 at that time.  Apparently, however, over the weekend, home health nurses noted his edema to be increased.  He is not describing any new PND or orthopnea.  He ambulates apparently with a walker short distance and has been able to do this.  He has not been having any chest pressure, neck or arm discomfort.  He is not describing any palpitations, presyncope, or syncope.  However, I discussed this with Dr. Domenic Polite this evening and apparently the patient's creatinine is up to 2.5 from his previous of 1.4.  He is admitted for IV inotropic therapy.  PAST MEDICAL HISTORY:  Atrial rhythm (atypical flutter versus ectopic atrial rhythm.  Currently not on Coumadin),  cardiomyopathy nonischemic, chronic renal insufficiency, hypertension, scoliosis, mild dementia, dyslipidemia, hypothyroidism, bilateral pleural effusions, recent left leg ulcer, left bundle-branch block.  PAST SURGICAL HISTORY:  Laparoscopic cholecystectomy, right inguinal herniorrhaphy.  ALLERGIES:  None.  MEDICATIONS: 1. Carvedilol 3.125 mg b.i.d. 2. Diovan 20 mg b.i.d. 3. Levothyroxine 150 mcg daily. 4. Furosemide 80 mg daily. 5. Potassium 20 mg daily. 6. Aspirin 81 mg daily.  SOCIAL HISTORY:  The patient is married.  He has no children.  He lives at home with his wife.  His brother is very involved in his health care. He has never smoked cigarettes, does not drink alcohol.  FAMILY HISTORY:  Noncontributory for early coronary disease though there is heart disease in first-degree relatives.  REVIEW OF SYSTEMS:  As stated in the HPI and negative for all other systems.  PHYSICAL EXAMINATION:  GENERAL:  The patient is pleasant and in no distress. VITAL SIGNS:  Blood pressure 122/70, heart rate 98 and regular, afebrile, respiratory rate 14. HEENT:  Eyes unremarkable, pupils are equal, round and reactive to light.  Fundi not visualized.  Oral mucosa unremarkable. NECK:  Jugular venous distention at  the angle of jaw at 45 degrees. Carotid upstroke is brisk and symmetrical.  No bruits, thyromegaly. LYMPHATICS:  No cervical, axillary, or inguinal adenopathy. LUNGS:  Decreased breath sounds, but no wheezing or crackles. BACK:  No costovertebral tenderness, severe lordosis. HEART:  PMI not displaced or sustained, distant heart sounds, S1 and S2 within normal.  No S3.  No S4.  No clicks.  No rubs.  No murmurs. ABDOMEN:  Flat, positive bowel sounds.  Normal frequency and pitch, no bruits, no rebound.  No guarding or midline pulsatile mass.  No hepatomegaly, splenomegaly. SKIN:  No rashes, no nodules, healed left lower extremity ulcer, chronic left greater than right venous stasis  changes. EXTREMITIES:  2+ upper pulses, 2+ femorals, diminished dorsalis pedis and post tibialis, moderately severe bilateral lower extremity edema to above the knees. NEUROLOGIC:  Seems slightly confused and slow to answer questions, but he is oriented to person, place, and time.  Cranial nerves grossly intact, motor grossly intact.  LABORATORY DATA:  Pending.  EKG:  Pending.  CHEST X-RAY:  Pending.  ASSESSMENT AND PLAN: 1. Acute on chronic systolic heart failure.  The patient is failing     home therapy and close followup.  He is going to be admitted for IV     inotropes.  His creatinine is apparently elevated and I will repeat     and verify this.  I will start him on low-dose of milrinone.  I     will try to continue the other meds as listed, though I will hold     the Diovan tonight at least.  We will reassess with repeat labs in     the morning.  He is going to need strict I's and O's, salt     restriction, and fluid restriction. 2. Hypothyroidism.  I will continue the current dose of Synthroid.     This was recently adjusted and checked at [facility]. 3. Renal insufficiency, as above. 4. Atrial arrhythmias.  I will check an EKG.  We will need to consider     anticoagulation therapy as he would be at very high risk for     thromboembolic events if this is determined to be atrial flutter.     Certainly, he is at risk for complications of anticoagulation and     we will need to weigh these risks and benefits.     Minus Breeding, MD, [facility]     JH/MEDQ  D:  12/09/2010  T:  12/09/2010  Job:  [id number]  Electronically Signed by Minus Breeding MD [facility] on 01/10/2011 11:45:56 AM

## 2011-01-13 ENCOUNTER — Encounter: Payer: Self-pay | Admitting: Internal Medicine

## 2011-01-13 ENCOUNTER — Ambulatory Visit (INDEPENDENT_AMBULATORY_CARE_PROVIDER_SITE_OTHER): Payer: No Typology Code available for payment source | Admitting: Internal Medicine

## 2011-01-13 VITALS — BP 98/52 | HR 56 | Ht 67.0 in | Wt 148.0 lb

## 2011-01-13 DIAGNOSIS — I5022 Chronic systolic (congestive) heart failure: Secondary | ICD-10-CM

## 2011-01-13 DIAGNOSIS — R21 Rash and other nonspecific skin eruption: Secondary | ICD-10-CM

## 2011-01-13 LAB — BASIC METABOLIC PANEL
Calcium: 8.7 mg/dL (ref 8.4–10.5)
Creatinine, Ser: 2.5 mg/dL — ABNORMAL HIGH (ref 0.4–1.5)

## 2011-01-13 LAB — BRAIN NATRIURETIC PEPTIDE: Pro B Natriuretic peptide (BNP): 169 pg/mL — ABNORMAL HIGH (ref 0.0–100.0)

## 2011-01-13 MED ORDER — CARVEDILOL 6.25 MG PO TABS: 6.2500 mg | ORAL_TABLET | Freq: Two times a day (BID) | ORAL | Status: DC

## 2011-01-13 MED ORDER — AMIODARONE HCL 200 MG PO TABS: 200.0000 mg | ORAL_TABLET | Freq: Every day | ORAL | Status: DC

## 2011-01-13 NOTE — Assessment & Plan Note (Signed)
Maintaining Sr on amio. Decrease dose to 200 qd. Continue coumadin.

## 2011-01-13 NOTE — Assessment & Plan Note (Signed)
Fungal rash in right AC fossa. Will give him miconazole cream.

## 2011-01-13 NOTE — Progress Notes (Signed)
HPI:  [name] is a 75 y/o male with h/o cognitive impairment, renal insufficiency (baseline about 1.5-1.6), severe CHF due to NICM (EF 15-20%), atrial flutter (s/p DC-CV in April 2012) and LBBB.   He was recently hospitalized with low-output HF and volume overload. Also found to be in atrial flutter. Was treated with milrinone and IV lasix and also underwent RHC and DC-CV. (we discussed ablation but given low output - we opted for DC-CV initially).   Coronary angiograms showed insignifcant CAD. Right heart cath (on milrinone) showed a right atrial pressure of about 12, PA pressure was 45/79 with a mean of 27, pulmonary capillary wedge pressure was 12, LV pressure was 85/5 with an EDP of about 8.  Fick cardiac output was 4.4 with a cardiac index of2.3.  Pulmonary artery saturations were 61 and 63%.  Pulmonary vascular resistance was 3.1 Woods units. After his catheterization, he then underwent TEE-guided cardioversion that showed an EF of about 15-20%.  There was no evidence of left atrial appendage thrombus.  He was successfully cardioverted with 1 shock.  He was started on amiodarone to help maintain sinus rhythm.  Given his LBBB we discussed the possibility of BiV-ICD but decided to defer that until we saw how he would do af cardiversion and HF tune-up.  At last visit we placed thigh-high stockings which have helped significantly. Edema improved. Weight down another 11 pounds. Doing fairly well. Now able to walk less. Able to go up steps.  Denies dyspnea. No PND or orthopnea. No palpitations. No bleeding with coumadin.  Home health nurse coming 2x/week. No syncope or presyncope.  Apparently had blood work last week but I do not see results on our system.   Had ROS: All systems negative except as listed in HPI, PMH and Problem List.  Past Medical History  Diagnosis Date  . Cardiomyopathy     Nonischemic, LVEF 20%  . Left bundle branch block   . Chronic renal insufficiency   . Hypothyroidism     . Scoliosis   . Mixed hyperlipidemia     Current Outpatient Prescriptions  Medication Sig Dispense Refill  . amiodarone (PACERONE) 200 MG tablet Take 200 mg by mouth 2 (two) times daily.        [name] aspirin 81 MG tablet Take 81 mg by mouth daily.        . carvedilol (COREG) 3.125 MG tablet Take 3.125 mg by mouth 2 (two) times daily with a meal.        . furosemide (LASIX) 80 MG tablet Take 40 mg by mouth 2 (two) times daily.       . hydrALAZINE (APRESOLINE) 25 MG tablet Take 12.5 mg by mouth 3 (three) times daily.        . isosorbide mononitrate (IMDUR) 30 MG 24 hr tablet Take 30 mg by mouth daily.        [name] levothyroxine (SYNTHROID, LEVOTHROID) 150 MCG tablet Take 150 mcg by mouth daily.        [name] spironolactone (ALDACTONE) 25 MG tablet Take 25 mg by mouth daily.        . valsartan (DIOVAN) 40 MG tablet Take 1 tablet (40 mg total) by mouth 2 (two) times daily.  60 tablet  3  . warfarin (COUMADIN) 3 MG tablet Take 3 mg by mouth as directed.          PHYSICAL EXAM: Filed Vitals:   01/13/11 1137  BP: 98/52  Pulse: 56   General:  Well appearing.  No resp difficulty. Sitting in W-C Kyphotic HEENT: normal Neck: supple. JVP flat. Carotids 2+ bilaterally; no bruits. No lymphadenopathy or thryomegaly appreciated. Cor: PMI laterally displace. Brady regular No rubs, gallops or murmurs. Lungs: clear Abdomen: soft, nontender, nondistended. No hepatosplenomegaly. No bruits or masses. Good bowel sounds. Extremities: no cyanosis, clubbing, rash. No edema. Thigh high stockings on. Fungal rash in right AC fossa Neuro: alert & orientedx3, cranial nerves grossly intact. Moves all 4 extremities w/o difficulty. Affect pleasant.    ECG: Sinus brady 53 1avb (228) LBBB (QRS 150)   ASSESSMENT & PLAN:

## 2011-01-13 NOTE — Patient Instructions (Signed)
Your physician recommends that you schedule a follow-up appointment in: 3 weeks with Dr. Haroldine Laws Your physician has recommended you make the following change in your medication: Increase carvedilol to 6.25 mg by mouth twice daily. Decrease amiodarone to 200 mg by mouth daily.  Apply miconazole cream to rash on arm three times daily. You had lab work done today.

## 2011-01-13 NOTE — Assessment & Plan Note (Signed)
Much improved from a functional standpoint. Now NYHA II to early III. Weight down another 10 pounds and now looks dry. I suspect we may have to cut lasix back to daily. Will check labs today and call him. Increase coreg to 6.25 bid and decrease amio to 200 qd. Will call insurance company to see if we can get his [place] covered (paying $190/mo).

## 2011-01-14 NOTE — Discharge Summary (Signed)
NAMEJARQUIS, [name]              ACCOUNT NO.:  [id number]  MEDICAL RECORD NO.:  [id number]           PATIENT TYPE:  I  LOCATION:  2917                         FACILITY:  [place]  PHYSICIAN:  Shaune Pascal. Bensimhon, MDDATE OF BIRTH:  02/05/1936  DATE OF ADMISSION:  12/09/2010 DATE OF DISCHARGE:  12/17/2010                              DISCHARGE SUMMARY   PRIMARY CARE PHYSICIAN:  Paula Compton. Willey Blade, MD  DISCHARGE DIAGNOSES: 1. Acute on chronic systolic heart failure with low output consistent     with cardiogenic shock. 2. Nonischemic cardiomyopathy, ejection fraction approximately 20%. 3. Atrial flutter, status post transesophageal echocardiography     cardioversion this admission. 4. Chronic renal insufficiency.  SECONDARY DIAGNOSIS: 1. Cognitive impairment. 2. Left bundle-branch block. 3. Hypothyroidism.  BRIEF HOSPITAL COURSE:  [name] is a very pleasant 75 year old male with a history of nonischemic cardiomyopathy.  He was hospitalized in March at [facility] for about a week and half with acute on chronic heart failure.  He also had a thoracentesis for his pleural effusions.  He was sent home with a PICC line.  Unfortunately, he continued to struggle with heart failure and volume overload.  He also began to experience worsening renal insufficiency.  He was reevaluated by Dr. Rozann Lesches and transferred to [facility] for further evaluation and treatment of his heart failure.  On arrival, he appeared to be in low output heart failure.  We confirmed this with a co-oximetry which was 40.1.  He was then started on milrinone and IV Lasix.  He had a very good response with abrupt increase in his cardiac output and diuresis.  Over the next few days, we had multiple conversations with him, his wife, and his family members about the level of aggressiveness of care.  Given his quite good functional capacity at baseline, we decided to pursue cardiac catheterization as well  as cardioversion of his atrial flutter.  He underwent left and right heart catheterization on December 13, 2010.  This showed minimal nonobstructive coronary artery disease with just minimal plaquing of the LAD of about 20%.  Right heart cath showed a right atrial pressure of about 12, PA pressure was 45/79 with a mean of 27, pulmonary capillary wedge pressure was 12, LV pressure was 85/5 with an EDP of about 8.  Fick cardiac output was 4.4 with a cardiac index of 2.3.  Pulmonary artery saturations were 61 and 63%.  Pulmonary vascular resistance was 3.1 Woods units.  Of note, this was on IV milrinone. After his catheterization, he then underwent TEE-guided cardioversion that showed an EF of about 15-20%.  There was no evidence of left atrial appendage thrombus.  He was successfully cardioverted with 1 shock.  He was started on amiodarone to help maintain sinus rhythm.  He was concurrently started on heparin and Coumadin and stayed in the hospital until his Coumadin level was therapeutic.  While in the hospital, we also adjusted his afterload reduction regimen very carefully.  We started with hydralazine and nitrates given the slight worsening of his renal function initially and then we restarted his Diovan.  Titration was limited somewhat  by hypotension.  We hope to titrate these more as an outpatient.  Given his class 3-4 heart failure and left bundle-branch block, we also discussed with EP the possibility of resynchronization therapy.  We have made the decision to hold off on this for now.  We will see how he does with standard medical therapy. If he continued to have severe heart failure symptoms, we could consider resynchronization plus/minus ICD implantation.  On the day of discharge, his weight was approximately 703.5 kg.  His creatinine was 1.45.  MEDICATIONS ON DISCHARGE: 1. Amiodarone 200 mg b.i.d. 2. Coumadin 3 mg p.o. nightly. 3. Coreg 3.125 b.i.d. 4. Lasix 40 a day. 5.  Spironolactone 25 daily. 6. Diovan 40 a day. 7. Hydralazine 12.5 mg q.8 h. 8. Imdur 30 mg daily. 9. Synthroid 150 mcg daily.  Of note, we will hold Coumadin on the day of discharge as his INR went up abruptly from 1.69 to 2.68 on the day of discharge.  Followup after discharge will be: 1. [facility] him.  He will have an     INR on Thursday and then on Monday he will have a BMET and an INR. 2. He will follow up with me next week. 3. He will follow up with his primary care doctor, Dr. Asencion Noble. 4. He will also continue to get home health nursing services from the     [facility].  Total time of discharge encounter is approximately 45 minutes.     Shaune Pascal. Bensimhon, MD     DRB/MEDQ  D:  12/17/2010  T:  12/18/2010  Job:  [id number]  cc:   Paula Compton. Willey Blade, MD  Electronically Signed by Glori Bickers MD on 01/14/2011 07:12:27 PM

## 2011-01-14 NOTE — Cardiovascular Report (Signed)
  NAMEABDULAHI, [name]              ACCOUNT NO.:  [id number]  MEDICAL RECORD NO.:  [id number]           PATIENT TYPE:  I  LOCATION:  2917                         FACILITY:  [place]  PHYSICIAN:  [name], MDDATE OF BIRTH:  [birth year]  DATE OF PROCEDURE:  12/13/2010 DATE OF DISCHARGE:                           CARDIAC CATHETERIZATION   PRIMARY CARE PHYSICIAN:  Joshua Compton. Willey Blade, MD  PATIENT IDENTIFICATION:  [name] is a very pleasant 75 year old male with a history of severe heart failure, requiring milrinone.  He had an ejection fraction in the 20% range.  He was brought today for right heart cath and also to assess his coronary anatomy.  PROCEDURES PERFORMED: 1. Right heart cath. 2. Left heart cath. 3. Selective coronary angiography.  NOTE:  We did not perform a left ventriculogram due to his chronic renal insufficiency with creatinine between 1.4 and 1.6.  DESCRIPTION OF PROCEDURE:  The risks and indication were explained to both the patient and his family.  Consent was signed and placed on the chart.  A 7-French venous sheath was placed in the right femoral vein using modified Seldinger technique and a 5-French arterial sheath was placed in the right femoral artery using a similar technique.  A standard Swan-Ganz catheter was used for right heart cath.  JL-4, JR-4, and angled pigtail were used for the coronary angiograms and left heart catheterization.  All catheter exchanges made over wire.  There were no apparent complications.  Total contrast used was 30 mL.  Right atrial pressure mean of 11-13.  RV 45/7 with EDP of 14.  PA pressure 45/79 with a mean of 27.  Pulmonary capillary wedge pressure mean of 12.  Central aortic pressure 87/43 with a mean of 58.  LV pressure 85/5 with an EDP of 6-9.  Fick cardiac output 4.4.  Cardiac index 2.3.  This was on milrinone.  Pulmonary vascular resistance 3.1 Woods units.  PA saturations were 61% and 63%.  Left main was  normal.  LAD was a long vessel coursing to the apex, it gave off 3 diagonals and has mild 20% plaquing in the proximal portion.  Left circumflex gave off 3 marginal branches, is angiographically normal.  Right coronary artery gave off an acute marginal PDA and posterolateral, it was a dominant vessel, this had minimal luminal plaquing.  ASSESSMENT: 1. Minimal nonobstructive coronary artery disease as described above. 2. Mild pulmonary arterial hypertension. 3. Low left-sided filling pressures. 4. Preserved cardiac output, on inotropic therapy.  PLAN/DISCUSSION:  His filling pressures are low.  We will hydrate him gently and decrease his Lasix.  We will wean milrinone as tolerated.  We will plan possible cardioversion this afternoon.     Joshua Pascal. Anitra Doxtater, MD     DRB/MEDQ  D:  12/13/2010  T:  12/13/2010  Job:  [id number]  cc:   Joshua Sark, MD  Electronically Signed by Joshua Bickers MD on 01/14/2011 07:12:31 PM

## 2011-01-15 ENCOUNTER — Ambulatory Visit: Payer: Self-pay | Admitting: *Deleted

## 2011-01-15 DIAGNOSIS — I4892 Unspecified atrial flutter: Secondary | ICD-10-CM

## 2011-01-15 LAB — POCT INR: INR: 3.6

## 2011-01-22 ENCOUNTER — Ambulatory Visit: Payer: Self-pay | Admitting: *Deleted

## 2011-01-22 DIAGNOSIS — I4892 Unspecified atrial flutter: Secondary | ICD-10-CM

## 2011-01-23 ENCOUNTER — Encounter: Payer: Self-pay | Admitting: Internal Medicine

## 2011-01-31 ENCOUNTER — Ambulatory Visit (INDEPENDENT_AMBULATORY_CARE_PROVIDER_SITE_OTHER): Payer: No Typology Code available for payment source | Admitting: *Deleted

## 2011-01-31 DIAGNOSIS — Z7901 Long term (current) use of anticoagulants: Secondary | ICD-10-CM | POA: Insufficient documentation

## 2011-01-31 DIAGNOSIS — I4892 Unspecified atrial flutter: Secondary | ICD-10-CM

## 2011-01-31 NOTE — H&P (Signed)
[name], [name]              ACCOUNT NO.:  [id number]   MEDICAL RECORD NO.:  [id number]          PATIENT TYPE:  INP   LOCATION:  [id number]                         FACILITY:  [facility]   PHYSICIAN:  Satira Sark, MD DATE OF BIRTH:  Sep 21, 1935   DATE OF ADMISSION:  12/09/2006  DATE OF DISCHARGE:                              HISTORY & PHYSICAL   REASON FOR ADMISSION:  Chest pain.   HISTORY OF PRESENT ILLNESS:  This is a pleasant 75 year old male with a  history of hypothyroidism who was transferred from [facility] having presented with chest and epigastric discomfort.  He  states that he was in his usual state of health until after eating  dinner yesterday at approximately 5 p.m.  He states that he had a fairly  greasy meal and approximately 30 minutes later began to experience a  sharp severe pain in his lower chest area.  He felt diaphoretic and  nauseated with this as well as mildly short of breath.  He took [place] and tried to sit up and stretch in the recliner, stating that  the pain continued longer than usual and he ultimately presented to the  Emergency Department.  He does state that he has had similar symptoms in  the past, also following meals.  At [facility] he was  treated with aspirin, nitroglycerin and a GI cocktail, ultimately with  significant improvement in his symptoms.  His blood work at that point  revealed a troponin I level of 0.04 with normal total CK of 83, normal  BNP of 43 and an elevated D dimer of 1734.  My understanding is that he  had a CT scan of the chest done at that facility (records pending) and  he is now transferred to [place] for further management.  On interview now he  states that he is comfortable.  He denies having any known history of  cardiac disease.  His electrocardiogram from [facility]  demonstrated sinus rhythm with premature ventricular complexes,  increased voltage and nonspecific  ST changes.  He did have some ST  elevation in leads V1 and V2 which is somewhat less evident on repeat  tracing now although other similar changes are noted.   ALLERGIES:  No known drug allergies.   PRESENT MEDICATIONS:  Synthroid and multivitamins.   PAST MEDICAL HISTORY:  Is as outlined above.  Also reports a history of  scoliosis, tonsillectomy and right inguinal herniorrhaphy.   SOCIAL HISTORY:  The patient lives in [place] with his wife.  He is retired from [employer].  He has no children and denies any  history of tobacco or alcohol use.   FAMILY HISTORY:  Significant for stroke in his mother who died at age 44  and diabetes and heart disease in his father who died at age 21.  He has  other siblings without obvious premature cardiovascular disease.   REVIEW OF SYSTEMS:  As described in present illness.  He reports  occasionally brief palpitations. Typically he is able to do normal  activities of daily living without any  exertional chest pain or dyspnea  on exertion.  This even includes moving large limbs in the yard outside.  He does have reflux symptoms and occasionally abdominal discomfort and  nausea.  He has had no dizziness or syncope.   PHYSICAL EXAMINATION:  VITAL SIGNS:  Temperature is 96.8 degrees, heart  rate 72 and regular, respirations 20, blood pressure 120/73.  Oxygen  saturation is 94% on three liters nasal cannula.  The patient is  presently comfortable, in no acute distress.  HEENT:  Conjunctivae and lids are normal.  Oropharynx is clear.  Left  eye lags somewhat behind the right with tracking which is chronic per  patient report.  NECK:  No elevated jugular pressure or loud bruits.  No thyromegaly or  thyroid tenderness is noted.  LUNGS:  Clear without labored breathing.  CARDIAC:  Regular rate and rhythm.  Somewhat distant heart sounds.  PMI  is indistinct.  There is no loud systolic murmur or obvious gallop.  Chest wall is mildly tender at  the lower left costal margin.  ABDOMEN:  Soft.  General fullness.  Mild tenderness.  No bruits.  Bowel  sounds are present.  No guarding.  EXTREMITIES:  Exhibit no significant pitting edema.  Old scar on the  left ankle.  Distal pulses are 1+ and symmetrical.  MUSCULOSKELETAL:  Significant kyphoscoliosis is noted.  NEUROPSYCHIATRIC:  The patient is alert and oriented x3.  Strength is  equal.   CT scan of the chest from [facility] is pending.   LABORATORY DATA:  Laboratory data from [facility]:  BMP  43, D-dimer 1734.  Total CK 83, CK-MB 1.7, troponin I 0.04, WBC 16.8.  Hemoglobin 15.4, hematocrit 45, platelets 218, sodium 137, potassium  3.5, chloride 105, bicarb 26, BUN 12, creatinine 1.1, glucose 185, INR  1.0.   IMPRESSION:  1. Chest/epigastric discomfort as outlined above in a 75 year old male      with hypothyroidism.  Symptoms occurred following a meal and      certainly raise the possibility of a gastrointestinal etiology.  He      does have some general abdominal fullness.  Of note, his troponin I      level was equivocal at 0.04 with normal BNP 43 and elevated D dimer      level.  He did have a CT scan of the chest at [facility], final results pending.  He denies any known history of      cardiovascular disease.  Electrocardiogram is largely nonspecific      at this point.  2. Unknown lipid status.   PLAN:  The patient is now admitted to the telemetry unit.  We will plan  to cycle a full set of cardiac markers as well as repeat baseline labs  including amylase, lipase, thyroid studies and liver function tests.  A  2D echocardiogram will be obtained to assess cardiac structure and  function and based on cardiac markers we can determine what type of  ischemic evaluation may be appropriate.  Given his general abdominal  fullness and mild discomfort we will also obtain an ultrasound of the abdomen.  He will be treated with  aspirin, Lovenox, nitroglycerin paste,  Synthroid and a proton pump inhibitor for the time being.  We have a  call placed to obtain the final results of the CT scan of the chest from  [facility].  Further plans to follow.  Satira Sark, MD  Electronically Signed     SGM/MEDQ  D:  12/09/2006  T:  12/09/2006  Job:  [id number]   cc:   Paula Compton. Willey Blade, MD

## 2011-01-31 NOTE — Consult Note (Signed)
[name], [name]              ACCOUNT NO.:  [id number]   MEDICAL RECORD NO.:  [id number]          PATIENT TYPE:  INP   LOCATION:  [place]                         FACILITY:  [facility]   PHYSICIAN:  Ebony Hail L. Lissa Merlin, N.P. DATE OF BIRTH:  [birth year]   DATE OF CONSULTATION:  12/09/2006  DATE OF DISCHARGE:                                 CONSULTATION   CONSULTING SURGEON:  Dr. Margot Chimes.   ADMITTING PHYSICIAN:  Dr. Domenic Polite with cardiology.   PRIMARY CARE PHYSICIAN:  Dr. Teryl Lucy of [place].   REASON FOR CONSULTATION:  Chest pain secondary to biliary colic,  probable choledocholithiasis.   HISTORY OF PRESENT ILLNESS:  [name] is a 74 year old male patient  with history of hypothyroidism.  He reports problems with chronic  occasional indigestion and occasional postprandial abdominal discomfort  after eating greasy foods.  Yesterday afternoon around 5, he ate some  greasy-type food.  About 30 minutes later, he developed severe sharp  epigastric pain that lasted at least 6 hours.  He attempted to treat  this with Alka-Seltzer without any relief.  Because of the severity of  the pain, he presented to the ER at [facility].  He was initially  treated as a possible cardiac patient.  He was given aspirin,  nitroglycerin as well as a GI cocktail.  Pain finally diminished.  D-  dimer was checked, and this was elevated at 1734 with high normal being  300.  A CT of the chest was done at [facility].  This result was sent here  to [facility].  By looking at the result, there was no acute process,  but the CT was actually ordered as a study to evaluate the aorta and was  not ordered as a CT of the chest with angio to rule out PE.  The patient  has subsequently been transferred to [facility] under Dr.  Myles Gip service because there was some concern that his chest pain  was related to cardiac ischemia.  Since admission, his EKG and enzymes  have been normal.  Lab work was obtained  this morning and did  demonstrate elevated LFTs including a total bilirubin of 3.5.  Amylase  and lipase have been normal.  Because the elevated LFTs, an ultrasound  of the abdomen has been completed.  This demonstrates an nonechogenic  mass within the gallbladder.  Gallbladder wall is thickened at 5.4 mm.  The common bile duct is dilated at 8.8 mm, and there is some question as  to whether the patient may be experiencing cholecystitis.  At the  present time, the patient is relatively pain free.  He still  experiencing some mild tenderness in the right middle quadrant, although  he has been tolerating a light diet today.  Surgical evaluation has been  requested.   REVIEW OF SYSTEMS:  As above.  The patient usually attempts to avoid  greasy foods if at all possible secondary to intermittent GI symptoms as  described above.   SOCIAL HISTORY:  He is married.  No alcohol.  No tobacco.  He is retired  from being a Scientist, water quality at  [employer].   PAST MEDICAL HISTORY:  Hypothyroid   PAST SURGICAL HISTORY:  Tonsillectomy and cataracts.   FAMILY HISTORY:  Noncontributory.   ALLERGIES:  NKDA.   CURRENT MEDICATIONS:  Please refer to the medication reconciliation  sheet for home medications.  Since admission, the patient has been  started on Synthroid p.o., enteric-coated aspirin 325 mg, half inch of  nitro paste q.6h., Lovenox 1 mg/kg subcutaneous q.12h. and Protonix 40  mg daily.   PHYSICAL EXAMINATION:  GENERAL:  Pleasant male patient complaining of  recent severe epigastric chest pain, currently improved.  VITAL SIGNS:  Temperature 96.8, BP 120/73, pulse 72 and regular,  respirations 20.  NEURO:  The patient is alert and oriented x3, moving all extremities x4.  He does stutter significantly with conversation, and he has somewhat of  a speech impediment as well.  HEENT:  Head is normocephalic.  The patient wears glasses.  Sclerae are  nonicteric at present time.  NECK:  Is supple without  appreciable adenopathy.  CHEST:  Bilateral lung sounds are clear to auscultation posteriorly.  Respiratory effort is nonlabored.  He is saturating 94% on 3 liters of  O2.  Note, the patient has kyphotic posture.  CARDIAC:  S1/S2 without rubs, murmurs, thrills or gallops.  Pulses  regular.  No JVD.  ABDOMEN:  Is soft, slightly obese, mildly tender in the right middle  quadrant without guarding or rebounding and no obvious  hepatosplenomegaly, masses or bruits.  EXTREMITIES:  Symmetrical in appearance without edema, cyanosis or  clubbing.   LABORATORY DATA:  Sodium 136, potassium 3.8, CO2 26, BUN 14, creatinine  1.08, glucose 107, total bilirubin 3.5, alkaline phosphatase 106, AST  239, ALT 198.  White count is 17,900, hemoglobin 13.5, platelets  212,000, lipase 36, amylase 92.   DIAGNOSTICS:  Ultrasound of the abdomen again reveals gallbladder wall  thickening with common bile duct dilatation, nonmobile echogenic mass  within the gallbladder.  The pancreas is poorly visualized secondary to  bowel gas.   IMPRESSION:  1. Biliary colic.  2. Probable choledocholithiasis with associated transaminitis.  3. Elevated D-dimer, uncertain if the patient has had pulmonary      embolus.  4. Leukocytosis with associated probable acute cholecystitis.  5. Hypothyroidism.   PLAN:  1. The patient again probably has biliary colic and associated acute      cholecystitis.  I suspect the patient had at least an obstruction      of the common bile duct yesterday and may or may not have fully      passed a stone.  Will repeat the LFTs in the morning.  If the total      bilirubin is up, the patient may need ERCP preoperatively.  I have      discussed this with Sharyl Nimrod, P.A.-C., with cardiology, and she      will obtain a GI consultation.  2. The patient has an elevated D-dimer at [facility].  CT of the chest     again was done but did not appear to have been done based on a PE      protocol, so will  go ahead repeat a D dimer in the morning.  If the      D-dimer is still elevated, the patient may need to have a VQ scan      and/or CT angiogram of the chest to rule out PE preoperatively.  He      is currently on Lovenox 1 mg/kg subcutaneous  q.12h.  Will continue      this and not place this medication on hold for presurgical issues      until status regarding PE is clarified.  3. Because the patient has leukocytosis and suspected underlying      cholecystitis, we will go ahead and begin      empiric Unasyn IV, noting that if total bilirubin is trending      downward, possible can undergo a laparoscopic cholecystectomy      either Thursday or Friday.  Will check a baseline PT/PTT and also      again need to clarify D-dimer/PE status.      [place] Lissa Merlin, N.P.     ALE/MEDQ  D:  12/09/2006  T:  12/09/2006  Job:  [id number]   cc:   Teryl Lucy, M.D.

## 2011-01-31 NOTE — Op Note (Signed)
[name], [name]              ACCOUNT NO.:  [id number]   MEDICAL RECORD NO.:  [id number]          PATIENT TYPE:  INP   LOCATION:  3731                         FACILITY:  [facility]   PHYSICIAN:  Lowella Bandy. Olevia Perches, MD     DATE OF BIRTH:  May 21, 1936   DATE OF PROCEDURE:  DATE OF DISCHARGE:                               OPERATIVE REPORT   PROCEDURE PERFORMED:  Endoscopic retrograde cholangiopancreatography  (ERCP) with sphincterotomy.   INDICATIONS FOR PROCEDURE:  This 75 year old gentleman was admitted with  upper abdominal pain, abnormal liver function tests which progressed to  jaundice, bilirubin up to 5.3.  Abnormal upper abdominal ultrasound  showing a sludge ball in the gallbladder with borderline dilated common  bile duct and temperature of 100 degrees consistent with early  cholangitis.  The patient has responded symptomatically to antibiotics.  He is undergoing ERCP with attempts to clean the bile duct, to rule out  cholelithiasis, and to proceed with sphincterotomy if indicated.   ENDOSCOPE:  An Olympus single-channel side-viewing duodenoscope.   SEDATION:  Versed 9 mg intravenous, fentanyl 75 mcg intravenously,  Benadryl 25 mg intravenously, and Glucagon 1.5 mg intravenously.   FINDINGS:  Olympus single-channel side-viewing duodenoscope passed  blindly through the posterior sphincter of the esophagus into the  stomach and into the duodenum.  The papilla was located without  difficulty, and it was located within a small periampullary  diverticulum.  The papilla was somewhat edematous, and the bile with  gravel was exuding freely from the papilla.  It was cannulated without  difficulty, with placement of a guidewire under fluoroscopic guidance.  Cholangiogram showed about a 10-mm common bile duct without obvious  stones.  Because of the apparent passage of the small gravel mixed with  the dark bile, we proceeded with a standard sphincterotomy, which was  generous.  There was  no bleeding from the sphincterotomy site.  A 10-mm  [employer] ERCP balloon was passed into the bile duct and common  hepatic duct under fluoroscopic guidance and swept the common bile duct  twice with delivery of a thick bile and sludge.  No definite stones were  seen.  Occlusion cholangiogram showed clear common bile duct.  The main  pancreatic duct was intentionally avoided.  The patient tolerated the  procedure well.   IMPRESSION:  1. Biliary sludge in the common bile duct, status post sphincterotomy      and clearance of the bile duct.  2. Cystic duct not visualized; rule out obstruction.  3. Small periampullary diverticulum.  4. Intentional nonvisualization of the main pancreatic duct.  5. Status post sphincterotomy.   PLAN:  Continue antibiotics and bowel rest.  The patient has been  tentatively scheduled for laparoscopic cholecystectomy by Dr. Margot Chimes  tomorrow.  We will recheck his liver function tests, amylase, and lipase  in the morning.      Lowella Bandy. Olevia Perches, MD  Electronically Signed     DMB/MEDQ  D:  12/11/2006  T:  12/12/2006  Job:  [id number]   cc:   Marijo Conception. Verl Blalock, MD, [facility]  Haywood Lasso, M.D.

## 2011-01-31 NOTE — Discharge Summary (Signed)
Joshua, Bowen              ACCOUNT NO.:  [id number]   MEDICAL RECORD NO.:  [id number]          PATIENT TYPE:  INP   LOCATION:  [id number]                         FACILITY:  [facility]   PHYSICIAN:  Minus Breeding, MD, FACCDATE OF BIRTH:  [birth year]   DATE OF ADMISSION:  12/09/2006  DATE OF DISCHARGE:  12/13/2006                               DISCHARGE SUMMARY   PROCEDURES:  1. Laparoscopic cholecystectomy with intraoperative cholangiogram.  2. Endoscopic retrograde cholangiopancreatography with sphincterotomy.  3. Abdominal ultrasound.  4. A 2-D echocardiogram.   PRIMARY DIAGNOSIS:  Chronic calculus cholecystitis with recent  choledocholithiasis status post ERCP.   SECONDARY DIAGNOSES:  1. Chest pain, cardiac enzymes are negative for MI, EF within normal      limits.  Follow up with cardiology p.r.n.  2. Left lung base nodular density, size approximately 6 mm, RECHECK      CHEST X-RAY IN SIX MONTHS.  3. Abnormal liver function testing with an initial AST of 239 and ALT      of 198, decreased to 55 and 92, respectively, prior to discharge.      Total bilirubin peak 5.1, at discharge 4.0 with direct 2.6 and      indirect 1.4.  4. Dyslipidemia with a cholesterol of 147, triglycerides 108, HDL 59,      LDL 96 this admission, medical therapy implemented.  5. Normal TSH at 1.139 and normal free T4 at 1.38 with a minimally low      free T3 at 2.2 (normal range 2.3 to 4.2).   TIME AT DISCHARGE:  Thirty-eight minutes.   HOSPITAL COURSE:  [name] is a 75 year old male with no previous  history of coronary artery disease.  He went to [facility] with  lower chest pain/epigastric pain which radiated around to his back, it  started at approximately 5:00 p.m. after eating a meal.  He was  transferred to [facility] for further evaluation and treatment.   A CT of the chest has been performed which showed no thoracic or upper  abdominal aortic infection or aneurysm.  It did  show mild fatty  infiltration throughout the liver.  In the hospital, his cardiac enzymes  were negative for MI.  A 2-D echocardiogram showed an EF of 55% with no  wall motion abnormalities and no significant valvular abnormality.  An  ultrasound was performed which showed gallbladder wall thickening with  either a large sledge ball or gallstone and an increased caliber of  common bile duct.  Surgical consult was called.  GI saw the patient as  well as [facility].  It was felt that with the mild  jaundice, biliary colic and chololithiasis, he needed ERCP and [facility] felt that he also need a cholecystectomy.  He had an  ERCP on December 11, 2006 with sphincterotomy.  There were no filling  defects visible in the biliary tree.  He had a laparoscopic  cholecystectomy on December 12, 2006.  An intraoperative cholangiogram was  normal.  He tolerated the procedure well.   On December 13, 2006, [name] was evaluated by  Dr. Margot Chimes of [facility] and it was felt that he was doing well.  Dr. Margot Chimes did not feel  that any further inpatient workup was indicated and from a surgical  standpoint he could be discharged.  [name]'s chest pain had  completed resolved with the cholecystectomy.  He denied shortness of  breath.  He was slightly weak, but able to move himself without  difficulty.  [name] was evaluated by Dr. Percival Spanish and considered  stable for discharge with outpatient followup arranged.   REPEAT CHEST X-RAY IN SIX MONTHS TO FOLLOW UP 6 MM LEFT LUNG BASE  GRANULOMA.   DISCHARGE INSTRUCTIONS:  1. His activity level is to be increased gradually.  2. He is to call [facility] for any problems with his incisions.  3. He is to follow up with Dr. Margot Chimes in two to three weeks.  4. He is to follow up with [facility] in [place] as needed.  5. He is to follow up with his family physician, Dr. Willey Blade, as needed      or as scheduled.  6. The  repeat chest x-ray is to be performed by Dr. Willey Blade.   DISCHARGE MEDICATIONS:  1. Aspirin 81 mg every day.  2. Levothyroxine 125 mcg every day.  3. Vicodin p.r.n.  4. Toprol XL 25 mg every day.  5. Prilosec OTC 20 mg a day.      Rosaria Ferries, PA-C      Minus Breeding, MD, [facility]  Electronically Signed    RB/MEDQ  D:  12/13/2006  T:  12/13/2006  Job:  [id number]   cc:   Paula Compton. Willey Blade, MD  Haywood Lasso, M.D.

## 2011-01-31 NOTE — Op Note (Signed)
[name], [name]              ACCOUNT NO.:  [id number]   MEDICAL RECORD NO.:  [id number]          PATIENT TYPE:  INP   LOCATION:  [id number]                         FACILITY:  [facility]   PHYSICIAN:  Haywood Lasso, M.D.DATE OF BIRTH:  1936-03-14   DATE OF PROCEDURE:  12/12/2006  DATE OF DISCHARGE:                               OPERATIVE REPORT   PREOPERATIVE DIAGNOSIS:  Chronic calculus cholecystitis with recent  choledocholithiasis status post ERCP.   POSTOPERATIVE DIAGNOSIS:  Chronic calculus cholecystitis with recent  choledocholithiasis status post ERCP.   OPERATION:  Laparoscopic cholecystectomy with operative cholangiogram.   SURGEON:  Dr. Margot Chimes   ASSISTANT:  Dr. Excell Seltzer   ANESTHESIA:  General endotracheal.   CLINICAL HISTORY:  [name] is a 75 year old gentleman who presented  with some epigastric pain initially thought perhaps cardiac, but  eventual workup showed this to be biliary in origin with elevated liver  functions.  He was thought to have either had or have recently passed a  common duct stone.  His pain got dramatically better almost immediately,  and his white count came down to normal with antibiotics.  Yesterday he  had an ERCP which confirmed some sludge-type material and debris that  was in the common duct with a patulous distal duct papilla suggestive  that he may have recently passed a stone.  We elected to proceed to  cholecystectomy today.   DESCRIPTION OF PROCEDURE:  The patient was seen in the holding area, and  he had no further questions.  He was taken to the operating room and  after satisfactory general endotracheal anesthesia had been obtained,  the abdomen was clipped, prepped, and draped.  The time-out occurred.   I used 0.25% plain Marcaine for each incision.  An umbilical incision  was made, the fascia identified and opened, and the peritoneal cavity  entered under direct vision.  A pursestring of 0 Vicryl was placed, the  Hasson  introduced, and the abdomen insufflated to 15.  The gallbladder  was tense and edematous.   The patient was placed in reverse Trendelenburg and tilted to the left.  A 10/11 trocar was placed in the epigastrium and two 5 mm ports in the  right midabdomen.  The gallbladder was decompressed and then grasped.   Some omental adhesions were taken down.  Both a combination of blunt and  hydrodissection was done around the cystic duct, and I could identify  what appeared to be the cystic artery and a nice long segment of the  cystic duct.  I could see the common duct in its normal location and  once I had the anatomy identified, I put a clip on the artery and one on  the cystic duct at its junction with the gallbladder.  The cystic duct  was opened, and we did not really get much bile out.  A Cook catheter  was introduced percutaneously and placed in the cystic duct and  operative angiography done.   The cholangiogram appeared normal to me.  The duct was somewhat dilated,  but there was good filling of the duodenum with no filling  defects and  nice filling of the hepatic radicals.   The catheter was removed and 2 clips placed on the stay side of the  cystic duct, and it was divided.  The artery was dissected out a little  further, additional clips placed on that, and it was divided.  The  gallbladder was removed from below to above with coagulation and current  of the cautery.  Once it was disconnected, it was placed in a bag.  We  irrigated and made sure the bed of the gallbladder was dry and then  removed the gallbladder through the epigastric port.   The abdomen was reinsufflated.  We checked again for hemostasis and,  again, everything was dry.  The umbilical port was closed with a  pursestring.  The lateral ports were removed.  The abdomen was deflated  through the epigastric port, and it was removed.  The skin was closed  with 4-0 Monocryl subcuticular plus Dermabond.   The patient  tolerated the procedure well.  There were no operative  complications.  All counts were correct.      Haywood Lasso, M.D.  Electronically Signed     CJS/MEDQ  D:  12/12/2006  T:  12/12/2006  Job:  [id number]   cc:   Thomas C. Verl Blalock, MD, [facility]  Lowella Bandy. Olevia Perches, MD  Paula Compton Willey Blade, MD

## 2011-01-31 NOTE — Letter (Signed)
December 13, 2006    Paula Compton. Willey Blade, [place]  [place], [place] [address]   RE:  Bowen, Joshua  MRN:  [id number]  /  DOB:  Apr 26, 1936   Dear Carloyn Manner:   This note concerns [name] who we saw for chest pain.  He was  subsequently found to have cholelithiasis and had a laparoscopic  cholecystectomy.  Hopefully, you should receive all of the notes from  this recent hospitalization.  I wanted to bring to your attention an  abnormal chest x-ray.  The x-ray taken at [facility] on December 11, 2006,  suggested a nodular density at the left lung base that was likely a  granuloma.  The radiologist suggested comparison with old films which we  did not have.  Alternatively, they have suggested a 92-month followup  chest x-ray.  Sine he will not need to follow with [place], I would like to  bring this to your attention and ask if you could follow up on this.  If  you have any questions about this, please do not hesitate to give me a  call.  My cell phone is [phone].   Thanks for your help with this.    Sincerely,      Minus Breeding, MD, [facility]  Electronically Signed    JH/MedQ  DD: 12/13/2006  DT: 12/13/2006  Job #: [id number]

## 2011-02-11 ENCOUNTER — Encounter: Payer: Self-pay | Admitting: Internal Medicine

## 2011-02-12 ENCOUNTER — Ambulatory Visit (INDEPENDENT_AMBULATORY_CARE_PROVIDER_SITE_OTHER): Payer: No Typology Code available for payment source | Admitting: Internal Medicine

## 2011-02-12 ENCOUNTER — Ambulatory Visit (INDEPENDENT_AMBULATORY_CARE_PROVIDER_SITE_OTHER): Payer: No Typology Code available for payment source | Admitting: *Deleted

## 2011-02-12 ENCOUNTER — Encounter: Payer: Self-pay | Admitting: Internal Medicine

## 2011-02-12 VITALS — BP 90/48 | HR 49 | Resp 18 | Ht 66.0 in | Wt 148.8 lb

## 2011-02-12 DIAGNOSIS — I4892 Unspecified atrial flutter: Secondary | ICD-10-CM

## 2011-02-12 DIAGNOSIS — E782 Mixed hyperlipidemia: Secondary | ICD-10-CM

## 2011-02-12 DIAGNOSIS — I5022 Chronic systolic (congestive) heart failure: Secondary | ICD-10-CM

## 2011-02-12 LAB — CBC WITH DIFFERENTIAL/PLATELET
Basophils Relative: 1 % (ref 0.0–3.0)
Eosinophils Absolute: 0.1 10*3/uL (ref 0.0–0.7)
Eosinophils Relative: 1.5 % (ref 0.0–5.0)
Hemoglobin: 12.1 g/dL — ABNORMAL LOW (ref 13.0–17.0)
Lymphocytes Relative: 19.8 % (ref 12.0–46.0)
MCHC: 33.8 g/dL (ref 30.0–36.0)
MCV: 92.7 fl (ref 78.0–100.0)
Monocytes Absolute: 0.8 10*3/uL (ref 0.1–1.0)
Neutro Abs: 4.8 10*3/uL (ref 1.4–7.7)
RBC: 3.87 Mil/uL — ABNORMAL LOW (ref 4.22–5.81)

## 2011-02-12 LAB — BASIC METABOLIC PANEL
CO2: 29 mEq/L (ref 19–32)
Chloride: 97 mEq/L (ref 96–112)
Sodium: 135 mEq/L (ref 135–145)

## 2011-02-12 LAB — POCT INR: INR: 3.2

## 2011-02-12 NOTE — Progress Notes (Signed)
HPI:  [name] is a 75 y/o male with h/o cognitive impairment, renal insufficiency (baseline about 1.5-1.6), severe CHF due to NICM (EF 15-20%), atrial flutter (s/p DC-CV in April 2012) and LBBB.   He was hospitalized in 4/12 with low-output HF and volume overload. Also found to be in atrial flutter. Was treated with milrinone and IV lasix and also underwent RHC and DC-CV. (we discussed ablation but given low output - we opted for DC-CV initially).   Coronary angiograms showed insignifcant CAD. Right heart cath (on milrinone) showed a right atrial pressure of about 12, PA pressure was 45/79 with a mean of 27, pulmonary capillary wedge pressure was 12, LV pressure was 85/5 with an EDP of about 8.  Fick cardiac output was 4.4 with a cardiac index of2.3.  Pulmonary artery saturations were 61 and 63%.  Pulmonary vascular resistance was 3.1 Woods units. After his catheterization, he then underwent TEE-guided cardioversion that showed an EF of about 15-20%.  There was no evidence of left atrial appendage thrombus.  He was successfully cardioverted with 1 shock.  He was started on amiodarone to help maintain sinus rhythm.  Given his LBBB we discussed the possibility of BiV-ICD but decided to defer that until we saw how he would do af cardiversion and HF tune-up.  Doing very well. Weight very stable. No edema. No problems with medications. 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 Uses a buggy in [place]. No bleeding with coumadin. No bleeding with coumadin.  ROS: All systems negative except as listed in HPI, PMH and Problem List.  Past Medical History  Diagnosis Date  . Cardiomyopathy     Nonischemic, LVEF 20%  . Left bundle branch block   . Chronic renal insufficiency   . Hypothyroidism   . Scoliosis   . Mixed hyperlipidemia     Current Outpatient Prescriptions  Medication Sig Dispense Refill  . amiodarone (PACERONE) 200 MG tablet Take 1 tablet (200 mg total) by mouth daily.   30 tablet  6  . aspirin 81 MG tablet Take 81 mg by mouth daily.        . carvedilol (COREG) 6.25 MG tablet Take 1 tablet (6.25 mg total) by mouth 2 (two) times daily with a meal.  60 tablet  6  . furosemide (LASIX) 80 MG tablet Take 40 mg by mouth 2 (two) times daily.       . hydrALAZINE (APRESOLINE) 25 MG tablet Take 12.5 mg by mouth 3 (three) times daily.        . isosorbide mononitrate (IMDUR) 30 MG 24 hr tablet Take 30 mg by mouth daily.        [name] levothyroxine (SYNTHROID, LEVOTHROID) 150 MCG tablet Take 150 mcg by mouth daily.        [name] spironolactone (ALDACTONE) 25 MG tablet Take 25 mg by mouth daily.        . valsartan (DIOVAN) 40 MG tablet Take 1 tablet (40 mg total) by mouth 2 (two) times daily.  60 tablet  3  . warfarin (COUMADIN) 3 MG tablet Take 3 mg by mouth as directed.          PHYSICAL EXAM: Filed Vitals:   02/12/11 1112  BP: 90/48  Pulse: 49  Resp: 18   General:  Well appearing. No resp difficulty. Walks around clinic without difficulty. Kyphotic HEENT: normal Neck: supple. JVP flat. Carotids 2+ bilaterally; no bruits. No lymphadenopathy or thryomegaly appreciated. Cor: PMI laterally displace. Loletha Grayer regular  No rubs, gallops or murmurs. Lungs: clear Abdomen: soft, nontender, nondistended. No hepatosplenomegaly. No bruits or masses. Good bowel sounds. Extremities: no cyanosis, clubbing, rash. No edema. Thigh high stockings on. Fungal rash in right AC fossa Neuro: alert & orientedx3, cranial nerves grossly intact. Moves all 4 extremities w/o difficulty. Affect pleasant.    ECG: Sinus brady 49 1avb (230) LBBB (QRS 150)   ASSESSMENT & PLAN:

## 2011-02-12 NOTE — Assessment & Plan Note (Signed)
Maintaining SR on amio. Continue current dose and coumadin. Will need amio surveillance labs over next few months.

## 2011-02-12 NOTE — Assessment & Plan Note (Signed)
Doing well NYHA II-III. Volume status looks good and in fact he may be a bit on the dry side. Will check labs today. If renal function worsening would back lasix back to 40 qd alternating with 40 bid. BP too low to titrate meds further. Check echo next week. HR remains low and if LV function remains down may need to consider BiV pacer (+/- ICD).

## 2011-02-12 NOTE — Patient Instructions (Signed)
Labs today (bmet, bnp, cbc) Your physician has requested that you have an echocardiogram. Echocardiography is a painless test that uses sound waves to create images of your heart. It provides your doctor with information about the size and shape of your heart and how well your heart's chambers and valves are working. This procedure takes approximately one hour. There are no restrictions for this procedure. Your physician recommends that you schedule a follow-up appointment in: 1 month

## 2011-02-21 ENCOUNTER — Emergency Department (HOSPITAL_COMMUNITY): Payer: No Typology Code available for payment source

## 2011-02-21 ENCOUNTER — Inpatient Hospital Stay (HOSPITAL_COMMUNITY)
Admission: EM | Admit: 2011-02-21 | Discharge: 2011-02-22 | DRG: 918 | Disposition: A | Discharge status: Home / Self Care | Payer: No Typology Code available for payment source | Attending: Internal Medicine | Admitting: Internal Medicine

## 2011-02-21 ENCOUNTER — Encounter: Payer: Self-pay | Admitting: Internal Medicine

## 2011-02-21 ENCOUNTER — Telehealth: Payer: Self-pay | Admitting: *Deleted

## 2011-02-21 DIAGNOSIS — R031 Nonspecific low blood-pressure reading: Secondary | ICD-10-CM | POA: Diagnosis present

## 2011-02-21 DIAGNOSIS — I4892 Unspecified atrial flutter: Secondary | ICD-10-CM | POA: Diagnosis present

## 2011-02-21 DIAGNOSIS — T50991A Poisoning by other drugs, medicaments and biological substances, accidental (unintentional), initial encounter: Principal | ICD-10-CM | POA: Diagnosis present

## 2011-02-21 DIAGNOSIS — I2589 Other forms of chronic ischemic heart disease: Secondary | ICD-10-CM | POA: Diagnosis present

## 2011-02-21 DIAGNOSIS — Z7901 Long term (current) use of anticoagulants: Secondary | ICD-10-CM

## 2011-02-21 DIAGNOSIS — R55 Syncope and collapse: Secondary | ICD-10-CM | POA: Diagnosis present

## 2011-02-21 DIAGNOSIS — I447 Left bundle-branch block, unspecified: Secondary | ICD-10-CM | POA: Diagnosis present

## 2011-02-21 DIAGNOSIS — E039 Hypothyroidism, unspecified: Secondary | ICD-10-CM | POA: Diagnosis present

## 2011-02-21 LAB — BASIC METABOLIC PANEL
BUN: 37 mg/dL — ABNORMAL HIGH (ref 6–23)
Calcium: 9 mg/dL (ref 8.4–10.5)
GFR calc Af Amer: 32 mL/min — ABNORMAL LOW (ref >60)
GFR calc non Af Amer: 26 mL/min — ABNORMAL LOW (ref >60)
Potassium: 4.4 mEq/L (ref 3.5–5.1)

## 2011-02-21 LAB — URINALYSIS, ROUTINE W REFLEX MICROSCOPIC
Bilirubin Urine: NEGATIVE
Ketones, ur: NEGATIVE mg/dL
Nitrite: NEGATIVE
Protein, ur: NEGATIVE mg/dL
Specific Gravity, Urine: 1.015 (ref 1.005–1.030)
Urobilinogen, UA: 0.2 mg/dL (ref 0.0–1.0)

## 2011-02-21 LAB — DIFFERENTIAL
Basophils Absolute: 0.1 10*3/uL (ref 0.0–0.1)
Basophils Relative: 1 % (ref 0–1)
Eosinophils Relative: 2 % (ref 0–5)
Lymphocytes Relative: 15 % (ref 12–46)
Monocytes Absolute: 0.7 10*3/uL (ref 0.1–1.0)

## 2011-02-21 LAB — CBC
HCT: 34.7 % — ABNORMAL LOW (ref 39.0–52.0)
MCH: 30.4 pg (ref 26.0–34.0)
MCHC: 32.6 g/dL (ref 30.0–36.0)
RDW: 15.2 % (ref 11.5–15.5)

## 2011-02-21 LAB — PROTIME-INR: Prothrombin Time: 25.4 seconds — ABNORMAL HIGH (ref 11.6–15.2)

## 2011-02-21 NOTE — Telephone Encounter (Signed)
Very confusing call from friend of pt, who didn't give his name--apparently pt confused about taking meds and took two days worth all at one time--friend wants to know what to do--advised to go to nearest ED to be evaluated--friend states he will not take pt to [facility]--asked what meds pt took and friend doesn't know--advised again to go to nearest ED--REFUSES--advised to try and get ahold of PCP to be seen there--friend states he will try--nt

## 2011-02-22 LAB — BASIC METABOLIC PANEL
BUN: 29 mg/dL — ABNORMAL HIGH (ref 6–23)
Calcium: 9 mg/dL (ref 8.4–10.5)
GFR calc Af Amer: 43 mL/min — ABNORMAL LOW (ref >60)
GFR calc non Af Amer: 36 mL/min — ABNORMAL LOW (ref >60)
Glucose, Bld: 101 mg/dL — ABNORMAL HIGH (ref 70–99)
Sodium: 139 mEq/L (ref 135–145)

## 2011-02-24 ENCOUNTER — Telehealth: Payer: Self-pay | Admitting: *Deleted

## 2011-02-24 NOTE — Telephone Encounter (Signed)
Appt rescheduled.  INR in ED on 6/9 was 2.3

## 2011-02-24 NOTE — Telephone Encounter (Signed)
Patient's brother states that patient has appointment on Friday 6/15 to have his PT/INR checked in [place].  He states that patient was brought to [facility] ED on Saturday 02/22/11 and it was checked then.  He states that Dr.Fagan told him it was fine.  Would like return phone call about rescheduling appointment. / tg

## 2011-02-24 NOTE — Telephone Encounter (Signed)
F/u labs looked OK. Continue current regimen.

## 2011-02-28 ENCOUNTER — Encounter: Payer: No Typology Code available for payment source | Admitting: *Deleted

## 2011-03-02 NOTE — Discharge Summary (Signed)
  NAMEJOSEJULIAN, [name]              ACCOUNT NO.:  [id number]  MEDICAL RECORD NO.:  [id number]  LOCATION:  A340                          FACILITY:  [facility]  PHYSICIAN:  Paula Compton. Willey Blade, MD       DATE OF BIRTH:  May 02, 1936  DATE OF ADMISSION:  02/21/2011 DATE OF DISCHARGE:  06/09/2012LH                              DISCHARGE SUMMARY   DISCHARGE DIAGNOSES: 1. Near syncope after accidental medication overdose. 2. Transient hypotension. 3. Dilated cardiomyopathy. 4. History of atrial flutter. 5. Left bundle branch block. 6. Hypothyroidism .  HOSPITAL COURSE:  This patient is a 75 year old white male with a dilated cardiomyopathy, who presented to the emergency room after accidentally taking a double dose of his morning medications.  He had become weak and dizzy.  He had a near syncopal episode at home and about involuntary defecation associated with it.  On presentation to the emergency room, he had dropped his blood pressure to as low as 86/41 with a heart rate in the high 40s.  He received moderate IV fluid therapy.  His blood pressure normalized.  His diuretics were temporarily held.  He was monitored overnight and was hemodynamically stable.  He was felt to be stable for discharge on the morning of the 9th.  He will have follow up in the heart failure clinic in 1 week.  He was satisfactorily anticoagulated with an INR of 2.3 on Coumadin.  He has undergone cardioversion for atrial flutter.  His EKG revealed sinus bradycardia with first-degree AV block at 49 beats per minute, initially has a left bundle branch block.  His medications are being restarted today as usual, no changes have been made.  His condition at discharge is improved.  Caution has been advised regarding his medication administration, his brother will administer his medications at this point.     Paula Compton. Willey Blade, MD     ROF/MEDQ  D:  02/22/2011  T:  02/22/2011  Job:  [id number]  cc:   Satira Sark,  MD [address], [place] [address]  Shaune Pascal. [place], [place] [address] [place] [address]  Electronically Signed by Asencion Noble MD on 03/02/2011 10:45:03 AM

## 2011-03-02 NOTE — H&P (Signed)
[name], [name]              ACCOUNT NO.:  [id number]  MEDICAL RECORD NO.:  [id number]  LOCATION:  A340                          FACILITY:  [facility]  PHYSICIAN:  Paula Compton. Willey Blade, MD       DATE OF BIRTH:  07-06-1936  DATE OF ADMISSION:  02/21/2011 DATE OF DISCHARGE:  LH                             HISTORY & PHYSICAL   CHIEF COMPLAINT:  Weakness.  HISTORY OF PRESENT ILLNESS:  This patient is a 75 year old developed mentally challenged white male who presented to the emergency room after feeling extremely weak.  He had near loss of consciousness at home after becoming extremely lightheaded and dizzy.  He had involuntarily defecated.  It turned out the patient had taken a double dose of his morning heart failure medications.  On presentation to the emergency room, he had mild hypotension.  He was treated with IV hydration.  He was found to have a BUN and creatinine of 37 and 2.4.  Electrolytes were normal.  He was bradycardic with heart rates in the 40s and 50s.  He was alert and comfortable at the time of my exam.  He has a history of dilated cardiomyopathy which has been followed closely in the Heart Failure Clinic by Dr. Haroldine Laws.  He had last been hospitalized in early April for cardiogenic shock associated with his cardiomyopathy.  His ejection fraction is approximately 20%.  He had undergone cardioversion for atrial flutter at that time.  PAST MEDICAL HISTORY: 1. Dilated cardiomyopathy. 2. Hypothyroidism. 3. Atrial flutter. 4. Renal insufficiency. 5. Left bundle-branch block. 6. History of left foot ulcer. 7. Status post cholecystectomy. 8. Tonsillectomy. 9. Eye surgery.  MEDICATIONS: 1. Aspirin 81 mg daily. 2. Levothyroxine 150 mcg daily. 3. Imdur 30 mg daily. 4. Spironolactone 25 mg daily. 5. Furosemide 40 mg b.i.d. 6. Carvedilol 6.25 mg b.i.d. 7. Hydralazine 25 mg 1-1/2 tablet t.i.d. 8. Coumadin 3 mg on Monday, Wednesday, Friday and Sunday; 1.5 mg on  Tuesday, Thursday, Saturday. 9. Diovan 40 mg b.i.d. 10.Amiodarone 200 mg daily.  ALLERGIES:  None.  SOCIAL HISTORY:  He does not use tobacco, alcohol or recreational substances.  FAMILY HISTORY:  His father had diabetes and an MI.  His mother had diabetes.  His brother has had an thoracic aneurysm repair.  He has had a sister with diabetes and a seizure disorder.  REVIEW OF SYSTEMS:  He denies complete loss of consciousness, chest pain, increased shortness of breath, vomiting, rectal bleeding or difficulty voiding.  PHYSICAL EXAMINATION:  VITAL SIGNS:  Blood pressure 92/47, pulse 54, respirations 21, temperature 97.5, oxygen saturation 94%. GENERAL:  Alert and comfortable. HEENT:  He has a strabismus.  No scleral icterus.  The pharynx is unremarkable. NECK:  Minimal JVD. LUNGS:  Clear. HEART:  Regular, bradycardic with no murmurs. ABDOMEN:  Nontender with no hepatosplenomegaly. EXTREMITIES:  No edema.  He is wearing compression stockings. NEURO:  At baseline.  No focal weakness. LYMPH NODES:  No cervical or supraclavicular enlargement. SKIN:  Chronic hyperpigmentation in the left lower leg without ulceration. BACK:  Kyphotic.  LABORATORY DATA:  White count is 6.7, hemoglobin 11.3 with an MCV of 93, platelets 170,000, 72 segs, 15 lymphs.  Sodium 137, potassium 4.4, bicarb 33, glucose 116, BUN 37, creatinine 2.42, calcium 9.0. Urinalysis reveals a negative nitrite and negative leukocyte esterase, no ketones.  Chest x-ray reveals a low volume film with no evidence of acute failure.  EKG reveals sinus bradycardia at 49 beats per minute with a first-degree AV block and a left bundle-branch block.  IMPRESSION/PLAN: 1. Near syncope with hypotension following an inadvertent medication     overdose.  As stated, the patient took his morning medications     twice and subsequently had weakness and dizziness.  He seems to be     improved now after moderate IV hydration.  He will be  observed in a     monitored setting overnight.  His cardiovascular medications will     be held initially and restarted pending his blood pressure.  His     renal insufficiency appears to be at baseline compared to recent     checks at the Heart Failure Clinic.  On May 30, 212, his BUN and     creatinine     had been 40 and 2.4 and on January 13, 2011, his BUN and creatinine     had been 39 and 2.5 atrial flutter.  He is satisfactorily anticoagulated with an INR of 2.3. Continue Coumadin. 1. Hypothyroidism.  Continue levothyroxine.     Paula Compton. Willey Blade, MD     ROF/MEDQ  D:  02/22/2011  T:  02/22/2011  Job:  [id number]  cc:   Shaune Pascal. [place], [address], [place] [address]  Satira Sark, MD [address], [place] [address]  Electronically Signed by Asencion Noble MD on 03/02/2011 10:45:05 AM

## 2011-03-02 NOTE — Discharge Summary (Signed)
  NAMEGUSTAVUS, BASALDUA              ACCOUNT NO.:  [id number]  MEDICAL RECORD NO.:  [id number]  LOCATION:  A340                          FACILITY:  [facility]  PHYSICIAN:  Paula Compton. Willey Blade, MD       DATE OF BIRTH:  1935/12/16  DATE OF ADMISSION:  02/21/2011 DATE OF DISCHARGE:  06/09/2012LH                              DISCHARGE SUMMARY   ADDENDUM:  DISCHARGE MEDICATIONS: 1. Aspirin 81 mg daily. 2. Coumadin 3 mg on Monday, Wednesday, Friday and Sunday; 1.5 mg on     Tuesday, Thursday, and Saturday. 3. Levothyroxine 150 mcg daily. 4. Isosorbide mononitrate 30 mg daily. 5. Spironolactone 25 mg daily. 6. Furosemide 40 mg b.i.d. 7. Carvedilol 6.25 mg b.i.d. 8. Hydralazine 12.5 mg t.i.d. 9. Diovan 40 mg b.i.d. 10.Amiodarone 200 mg daily.     Paula Compton. Willey Blade, MD     ROF/MEDQ  D:  02/22/2011  T:  02/22/2011  Job:  [id number]  Electronically Signed by Asencion Noble MD on 03/02/2011 10:45:08 AM

## 2011-03-07 ENCOUNTER — Encounter: Payer: No Typology Code available for payment source | Admitting: *Deleted

## 2011-03-10 ENCOUNTER — Encounter: Payer: Self-pay | Admitting: Internal Medicine

## 2011-03-10 ENCOUNTER — Ambulatory Visit (INDEPENDENT_AMBULATORY_CARE_PROVIDER_SITE_OTHER): Payer: No Typology Code available for payment source | Admitting: Internal Medicine

## 2011-03-10 ENCOUNTER — Ambulatory Visit (HOSPITAL_COMMUNITY): Payer: No Typology Code available for payment source | Attending: Internal Medicine | Admitting: Radiology

## 2011-03-10 VITALS — BP 98/54 | HR 48 | Ht 63.0 in | Wt 146.0 lb

## 2011-03-10 DIAGNOSIS — I079 Rheumatic tricuspid valve disease, unspecified: Secondary | ICD-10-CM | POA: Insufficient documentation

## 2011-03-10 DIAGNOSIS — I5022 Chronic systolic (congestive) heart failure: Secondary | ICD-10-CM

## 2011-03-10 DIAGNOSIS — I509 Heart failure, unspecified: Secondary | ICD-10-CM | POA: Insufficient documentation

## 2011-03-10 DIAGNOSIS — I4892 Unspecified atrial flutter: Secondary | ICD-10-CM

## 2011-03-10 DIAGNOSIS — E785 Hyperlipidemia, unspecified: Secondary | ICD-10-CM | POA: Insufficient documentation

## 2011-03-10 DIAGNOSIS — I428 Other cardiomyopathies: Secondary | ICD-10-CM | POA: Insufficient documentation

## 2011-03-10 DIAGNOSIS — I359 Nonrheumatic aortic valve disorder, unspecified: Secondary | ICD-10-CM | POA: Insufficient documentation

## 2011-03-10 LAB — BASIC METABOLIC PANEL
BUN: 39 mg/dL — ABNORMAL HIGH (ref 6–23)
Creatinine, Ser: 2.1 mg/dL — ABNORMAL HIGH (ref 0.4–1.5)
GFR: 32.93 mL/min — ABNORMAL LOW (ref >60.00)
Potassium: 4.1 mEq/L (ref 3.5–5.1)

## 2011-03-10 LAB — T4, FREE: Free T4: 2.17 ng/dL — ABNORMAL HIGH (ref 0.60–1.60)

## 2011-03-10 LAB — TSH: TSH: 0.27 u[IU]/mL — ABNORMAL LOW (ref 0.35–5.50)

## 2011-03-10 NOTE — Assessment & Plan Note (Addendum)
Doing quite well. NYHA II. Volume status looks good. Will check labs today to make sure renal function is stable. EF remains low and is bradycardic thus may benefit from BiViCD if appropriate. Will d/w Dr. Willey Blade.  Addendum: I discussed possibility of BivICD with Dr. Willey Blade and our concencus is that if he is doing well currently we will continue with currently therapy and not be more aggressive. We can reconsider if he deteriorates.

## 2011-03-10 NOTE — Patient Instructions (Signed)
Labs today  Your physician recommends that you schedule a follow-up appointment in: 3 months  

## 2011-03-10 NOTE — Progress Notes (Signed)
HPI:  [name] is a 75 y/o male with h/o cognitive impairment, renal insufficiency (baseline about 1.5-1.6), severe CHF due to NICM (EF 15-20%), atrial flutter (s/p DC-CV in April 2012) and LBBB.   He was hospitalized in 4/12 with low-output HF and volume overload. Also found to be in atrial flutter. Was treated with milrinone and IV lasix and also underwent RHC and DC-CV. (we discussed ablation but given low output - we opted for DC-CV initially).   Coronary angiograms showed insignifcant CAD. Right heart cath (on milrinone) showed a right atrial pressure of about 12, PA pressure was 45/79 with a mean of 27, pulmonary capillary wedge pressure was 12, LV pressure was 85/5 with an EDP of about 8.  Fick cardiac output was 4.4 with a cardiac index of2.3.  Pulmonary artery saturations were 61 and 63%.  Pulmonary vascular resistance was 3.1 Woods units. After his catheterization, he then underwent TEE-guided cardioversion that showed an EF of about 15-20%.  There was no evidence of left atrial appendage thrombus.  He was successfully cardioverted with 1 shock.  He was started on amiodarone to help maintain sinus rhythm.  Given his LBBB we discussed the possibility of BiV-ICD but decided to defer that until we saw how he would do af cardiversion and HF tune-up. Recent   Continues to do very well. Weight very stable 143-144 at home. No edema. No problems with medications. 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 Uses a buggy in [place]. No bleeding with coumadin. No bleeding with coumadin. At last check Cr stable at 2.4 K 5.0.   Echo today which I reviewed EF about 25%. Mild MR.    ROS: All systems negative except as listed in HPI, PMH and Problem List.  Past Medical History  Diagnosis Date  . Cardiomyopathy     Nonischemic, LVEF 20%  . Left bundle branch block   . Chronic renal insufficiency   . Hypothyroidism   . Scoliosis   . Mixed hyperlipidemia     Current Outpatient  Prescriptions  Medication Sig Dispense Refill  . amiodarone (PACERONE) 200 MG tablet Take 1 tablet (200 mg total) by mouth daily.  30 tablet  6  . aspirin 81 MG tablet Take 81 mg by mouth daily.        . carvedilol (COREG) 6.25 MG tablet Take 1 tablet (6.25 mg total) by mouth 2 (two) times daily with a meal.  60 tablet  6  . furosemide (LASIX) 80 MG tablet Take 40 mg by mouth 2 (two) times daily.       . hydrALAZINE (APRESOLINE) 25 MG tablet Take 12.5 mg by mouth 3 (three) times daily.        . isosorbide mononitrate (IMDUR) 30 MG 24 hr tablet Take 30 mg by mouth daily.        [name] levothyroxine (SYNTHROID, LEVOTHROID) 150 MCG tablet Take 150 mcg by mouth daily.        [name] spironolactone (ALDACTONE) 25 MG tablet Take 25 mg by mouth daily.        . valsartan (DIOVAN) 40 MG tablet Take 1 tablet (40 mg total) by mouth 2 (two) times daily.  60 tablet  3  . warfarin (COUMADIN) 3 MG tablet Take 3 mg by mouth as directed.          PHYSICAL EXAM: Filed Vitals:   03/10/11 1129  BP: 98/54  Pulse: 60   General:  Well appearing. No resp difficulty. Walks  around clinic without difficulty. Kyphotic HEENT: normal Neck: supple. JVP flat. Carotids 2+ bilaterally; no bruits. No lymphadenopathy or thryomegaly appreciated. Cor: PMI laterally displace. Brady regular No rubs, gallops or murmurs. Lungs: clear Abdomen: soft, nontender, nondistended. No hepatosplenomegaly. No bruits or masses. Good bowel sounds. Extremities: no cyanosis, clubbing, rash. No edema. Thigh high stockings on. Fungal rash in right AC fossa Neuro: alert & orientedx3, cranial nerves grossly intact. Moves all 4 extremities w/o difficulty. Affect pleasant.    ASSESSMENT & PLAN:

## 2011-03-10 NOTE — Assessment & Plan Note (Signed)
Maintaining SR but is quite bradycardic. Will continue amio and coumadin. Check thryoid.

## 2011-05-09 ENCOUNTER — Other Ambulatory Visit: Payer: Self-pay | Admitting: Internal Medicine

## 2011-06-10 ENCOUNTER — Ambulatory Visit (INDEPENDENT_AMBULATORY_CARE_PROVIDER_SITE_OTHER): Payer: No Typology Code available for payment source | Admitting: Internal Medicine

## 2011-06-10 ENCOUNTER — Encounter: Payer: Self-pay | Admitting: Internal Medicine

## 2011-06-10 VITALS — BP 126/50 | HR 50 | Wt 151.0 lb

## 2011-06-10 DIAGNOSIS — I4892 Unspecified atrial flutter: Secondary | ICD-10-CM

## 2011-06-10 DIAGNOSIS — I5022 Chronic systolic (congestive) heart failure: Secondary | ICD-10-CM

## 2011-06-10 DIAGNOSIS — I4891 Unspecified atrial fibrillation: Secondary | ICD-10-CM

## 2011-06-10 LAB — T4, FREE: Free T4: 1.65 ng/dL — ABNORMAL HIGH (ref 0.60–1.60)

## 2011-06-10 LAB — COMPREHENSIVE METABOLIC PANEL
CO2: 33 mEq/L — ABNORMAL HIGH (ref 19–32)
Creatinine, Ser: 2.2 mg/dL — ABNORMAL HIGH (ref 0.4–1.5)
GFR: 31.85 mL/min — ABNORMAL LOW (ref >60.00)
Glucose, Bld: 83 mg/dL (ref 70–99)
Total Bilirubin: 0.7 mg/dL (ref 0.3–1.2)

## 2011-06-10 LAB — TSH: TSH: 1.18 u[IU]/mL (ref 0.35–5.50)

## 2011-06-10 LAB — T3, FREE: T3, Free: 2 pg/mL — ABNORMAL LOW (ref 2.3–4.2)

## 2011-06-10 MED ORDER — HYDRALAZINE HCL 25 MG PO TABS: 25.0000 mg | ORAL_TABLET | Freq: Three times a day (TID) | ORAL | Status: DC

## 2011-06-10 NOTE — Assessment & Plan Note (Signed)
Maintaining SR on amio. Tolerating coumadin. With check surveillance labs.

## 2011-06-10 NOTE — Progress Notes (Signed)
HPI:  [name] is a 75 y/o male with h/o cognitive impairment, renal insufficiency (baseline about 1.5-1.6), severe CHF due to NICM (EF 15-20%), atrial flutter (s/p DC-CV in April 2012) and LBBB.   He was hospitalized in 4/12 with low-output HF and volume overload. Also found to be in atrial flutter. Was treated with milrinone and IV lasix and also underwent RHC and DC-CV. (we discussed ablation but given low output - we opted for DC-CV initially).   Coronary angiograms showed insignifcant CAD. Right heart cath (on milrinone) showed a right atrial pressure of about 12, PA pressure was 45/79 with a mean of 27, pulmonary capillary wedge pressure was 12, LV pressure was 85/5 with an EDP of about 8.  Fick cardiac output was 4.4 with a cardiac index of2.3.  Pulmonary artery saturations were 61 and 63%.  Pulmonary vascular resistance was 3.1 Woods units. After his catheterization, he then underwent TEE-guided cardioversion that showed an EF of about 15-20%.  There was no evidence of left atrial appendage thrombus.  He was successfully cardioverted with 1 shock.  He was started on amiodarone to help maintain sinus rhythm.  Given his LBBB we discussed the possibility of BiV-ICD but decided to defer as he was doing so well. At last visit in June 2012 was doing great. Cr 2.1. No change in meds. ECHO with EF 25-30%  Continues to do very well. Weight very stable 143-144 at home. No edema. No problems with medications. 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 No longer using a buggy in [place]. No bleeding with coumadin. At last check Cr stable at 2.4 K 5.0.     ROS: All systems negative except as listed in HPI, PMH and Problem List.  Past Medical History  Diagnosis Date  . Cardiomyopathy     Nonischemic, LVEF 20%  . Left bundle branch block   . Chronic renal insufficiency   . Hypothyroidism   . Scoliosis   . Mixed hyperlipidemia     Current Outpatient Prescriptions  Medication Sig  Dispense Refill  . amiodarone (PACERONE) 200 MG tablet Take 1 tablet (200 mg total) by mouth daily.  30 tablet  6  . aspirin 81 MG tablet Take 81 mg by mouth daily.        . carvedilol (COREG) 6.25 MG tablet Take 1 tablet (6.25 mg total) by mouth 2 (two) times daily with a meal.  60 tablet  6  . DIOVAN 40 MG tablet TAKE ONE TABLET BY MOUTH TWICE DAILY  60 each  10  . furosemide (LASIX) 80 MG tablet Take 40 mg by mouth 2 (two) times daily.       . hydrALAZINE (APRESOLINE) 25 MG tablet Take 12.5 mg by mouth 3 (three) times daily.        . isosorbide mononitrate (IMDUR) 30 MG 24 hr tablet Take 30 mg by mouth daily.        [name] levothyroxine (SYNTHROID, LEVOTHROID) 150 MCG tablet Take 150 mcg by mouth daily.        [name] spironolactone (ALDACTONE) 25 MG tablet Take 25 mg by mouth daily.        [name] warfarin (COUMADIN) 3 MG tablet Take 3 mg by mouth as directed.          PHYSICAL EXAM: Filed Vitals:   06/10/11 1044  BP: 126/50  Pulse: 50    Vitals - 1 value per visit 06/10/2011 03/10/2011 02/12/2011 01/13/2011  Weight (lb) 151 146 148.8  148  HEIGHT  5\' 3"  5\' 6"  5\' 7"    Vitals - 1 value per visit 12/26/2010 12/02/2010 11/08/2010  Weight (lb) 159.4 168 188  HEIGHT 5\' 6"  5\' 8"  5\' 8"    General:  Well appearing. No resp difficulty. Walks around clinic without difficulty. Kyphotic HEENT: normal Neck: supple. JVP flat. Carotids 2+ bilaterally; no bruits. No lymphadenopathy or thryomegaly appreciated. Cor: PMI laterally displace. Brady regular No rubs, gallops or murmurs. Lungs: clear Abdomen: soft, nontender, nondistended. No hepatosplenomegaly. No bruits or masses. Good bowel sounds. Extremities: no cyanosis, clubbing, rash. No edema. Stockings on.  Neuro: alert & orientedx3, cranial nerves grossly intact. Moves all 4 extremities w/o difficulty. Affect pleasant.  ECG: Sinus brady 50 LBBB 1AVB (235ms)  ASSESSMENT & PLAN:

## 2011-06-10 NOTE — Patient Instructions (Signed)
Your physician has recommended you make the following change in your medication: increase Hydralazine to 25mg  1 tablet three times daily. Your physician recommends that you schedule a follow-up appointment in: 3 months in heart failure clinic. Your physician recommends that you return for lab work in: today.

## 2011-06-10 NOTE — Assessment & Plan Note (Signed)
Doing very well. NYHA II. Volume status looks good. Will increase hydralazine to 25 tid. Not interested in ICD at this point.

## 2011-06-11 ENCOUNTER — Telehealth: Payer: Self-pay | Admitting: *Deleted

## 2011-06-11 NOTE — Telephone Encounter (Signed)
Lab results from visit 9-25 faxed via EPIC to Dr Willey Blade per Dr Bensimhon's request

## 2011-07-22 ENCOUNTER — Other Ambulatory Visit: Payer: Self-pay | Admitting: Cardiology

## 2011-08-18 ENCOUNTER — Other Ambulatory Visit: Payer: Self-pay | Admitting: Internal Medicine

## 2011-08-22 ENCOUNTER — Other Ambulatory Visit: Payer: Self-pay | Admitting: Internal Medicine

## 2011-09-04 ENCOUNTER — Ambulatory Visit (HOSPITAL_COMMUNITY)
Admission: RE | Admit: 2011-09-04 | Discharge: 2011-09-04 | Disposition: A | Discharge status: Home / Self Care | Payer: No Typology Code available for payment source | Source: Ambulatory Visit | Attending: Internal Medicine | Admitting: Internal Medicine

## 2011-09-04 VITALS — BP 100/58 | HR 48 | Wt 149.5 lb

## 2011-09-04 DIAGNOSIS — I5022 Chronic systolic (congestive) heart failure: Secondary | ICD-10-CM | POA: Insufficient documentation

## 2011-09-04 DIAGNOSIS — I4892 Unspecified atrial flutter: Secondary | ICD-10-CM | POA: Insufficient documentation

## 2011-09-04 MED ORDER — FUROSEMIDE 40 MG PO TABS: 40.0000 mg | ORAL_TABLET | Freq: Two times a day (BID) | ORAL | Status: DC

## 2011-09-04 NOTE — Patient Instructions (Signed)
Do the following things EVERYDAY: 1) Weigh yourself in the morning before breakfast. Write it down and keep it in a log. 2) Take your medicines as prescribed 3) Eat low salt foods-Limit salt (sodium) to 2000mg  per day.  4) Stay as active as you can everyday   Follow up 3 months

## 2011-09-04 NOTE — Assessment & Plan Note (Addendum)
NYHA II. Volume status stable. Tolerating medications. Will not titrate medications due soft blood pressure. Follow up in 3 months.   Patient seen and examined with Darrick Grinder, NP. We discussed all aspects of the encounter. I agree with the assessment and plan as stated above. He is doing great. Continue current therapy. We have discussed ICD +/- CRT in past and have decided not to pursue. Will be due for f/u echo over next few months.

## 2011-09-04 NOTE — Progress Notes (Signed)
Patient ID: [name], male   DOB: 04-09-1936, 75 y.o.   MRN: [id number] HPI:  [name] is a 75 y/o male with h/o cognitive impairment, renal insufficiency (baseline about 1.5-1.6), severe CHF due to NICM (EF 15-20%), atrial flutter (s/p DC-CV in April 2012) and LBBB.   He was hospitalized in 4/12 with low-output HF and volume overload. Also found to be in atrial flutter. Was treated with milrinone and IV lasix and also underwent RHC and DC-CV. (we discussed ablation but given low output - we opted for DC-CV initially).   Coronary angiograms showed insignifcant CAD. Right heart cath (on milrinone) showed a right atrial pressure of about 12, PA pressure was 45/79 with a mean of 27, pulmonary capillary wedge pressure was 12, LV pressure was 85/5 with an EDP of about 8.  Fick cardiac output was 4.4 with a cardiac index of2.3.  Pulmonary artery saturations were 61 and 63%.  Pulmonary vascular resistance was 3.1 Woods units. After his catheterization, he then underwent TEE-guided cardioversion that showed an EF of about 15-20%.  There was no evidence of left atrial appendage thrombus.  He was successfully cardioverted with 1 shock.  He was started on amiodarone to help maintain sinus rhythm.  Given his LBBB we discussed the possibility of BiV-ICD but decided to defer as he was doing so well. At last visit in June 2012 was doing great. Cr 2.1. No change in meds. ECHO with EF 25-30%  He is here for follow up. Feels great. Hydralazine increased last visit . He has tolerated this well. Denies dizziness. Denies SOB/PND/Orthopnea. Weight very stable 143-144 at home.  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  No bleeding with coumadin.     ROS: All systems negative except as listed in HPI, PMH and Problem List.  Past Medical History  Diagnosis Date  . Cardiomyopathy     Nonischemic, LVEF 20%  . Left bundle branch block   . Chronic renal insufficiency   . Hypothyroidism   .  Scoliosis   . Mixed hyperlipidemia     Current Outpatient Prescriptions  Medication Sig Dispense Refill  . aspirin 81 MG tablet Take 81 mg by mouth daily.        . carvedilol (COREG) 6.25 MG tablet TAKE ONE TABLET BY MOUTH TWICE DAILY WITH MEALS  60 tablet  5  . DIOVAN 40 MG tablet TAKE ONE TABLET BY MOUTH TWICE DAILY  60 each  10  . furosemide (LASIX) 40 MG tablet Take 1 tablet (40 mg total) by mouth 2 (two) times daily.  60 tablet  6  . hydrALAZINE (APRESOLINE) 25 MG tablet Take 1 tablet (25 mg total) by mouth 3 (three) times daily.  90 tablet  11  . isosorbide mononitrate (IMDUR) 30 MG 24 hr tablet Take 30 mg by mouth daily.        [name] levothyroxine (SYNTHROID, LEVOTHROID) 150 MCG tablet Take 150 mcg by mouth daily.        [name] PACERONE 200 MG tablet TAKE ONE TABLET BY MOUTH EVERY DAY  30 each  6  . spironolactone (ALDACTONE) 25 MG tablet Take 25 mg by mouth daily.        [name] warfarin (COUMADIN) 3 MG tablet Take 3 mg by mouth as directed.          PHYSICAL EXAM: Filed Vitals:   09/04/11 1150  BP: 84/40  Pulse: 48    Vitals - 1 value per visit 06/10/2011  03/10/2011 02/12/2011 01/13/2011  Weight (lb) 151 146 148.8 148  HEIGHT  5\' 3"  5\' 6"  5\' 7"    Vitals - 1 value per visit 12/26/2010 12/02/2010 11/08/2010  Weight (lb) 159.4 168 188  HEIGHT 5\' 6"  5\' 8"  5\' 8"    General:  Well appearing. No resp difficulty. Walks around clinic without difficulty. Kyphotic HEENT: normal Neck: supple. JVP flat. Carotids 2+ bilaterally; no bruits. No lymphadenopathy or thryomegaly appreciated. Cor: PMI laterally displace. Brady regular No rubs, gallops or murmurs. Split S2 Lungs: Diminished in bases Abdomen: soft, nontender, nondistended. No hepatosplenomegaly. No bruits or masses. Good bowel sounds. Extremities: no cyanosis, clubbing, rash. No edema. Stockings on.  Neuro: alert & orientedx3, cranial nerves grossly intact. Moves all 4 extremities w/o difficulty. Affect pleasant. Skin: L cheek intact scab .5  x.5cm   ASSESSMENT & PLAN:

## 2011-09-10 ENCOUNTER — Telehealth (HOSPITAL_COMMUNITY): Payer: Self-pay | Admitting: *Deleted

## 2011-09-10 NOTE — Telephone Encounter (Signed)
[facility], called, Mr Malia is coming to [place] on Friday am for B  MET- need diagnosis on order.   (cam 09-10-11)

## 2011-09-11 NOTE — Telephone Encounter (Signed)
[name] was off today, spoke w/[name] she made a note of pt's diagnosis

## 2011-09-14 NOTE — Assessment & Plan Note (Signed)
Maintaining SR on low-dose amio. Check surveillance labs. Continue coumadin.

## 2011-10-03 ENCOUNTER — Ambulatory Visit (INDEPENDENT_AMBULATORY_CARE_PROVIDER_SITE_OTHER): Payer: No Typology Code available for payment source | Admitting: *Deleted

## 2011-10-03 DIAGNOSIS — Z7901 Long term (current) use of anticoagulants: Secondary | ICD-10-CM

## 2011-10-03 DIAGNOSIS — I4892 Unspecified atrial flutter: Secondary | ICD-10-CM

## 2011-10-03 LAB — POCT INR: INR: 2

## 2011-10-31 ENCOUNTER — Ambulatory Visit (INDEPENDENT_AMBULATORY_CARE_PROVIDER_SITE_OTHER): Payer: No Typology Code available for payment source | Admitting: *Deleted

## 2011-10-31 DIAGNOSIS — I4892 Unspecified atrial flutter: Secondary | ICD-10-CM

## 2011-10-31 DIAGNOSIS — Z7901 Long term (current) use of anticoagulants: Secondary | ICD-10-CM

## 2011-12-04 ENCOUNTER — Ambulatory Visit (HOSPITAL_COMMUNITY): Payer: No Typology Code available for payment source

## 2011-12-09 ENCOUNTER — Other Ambulatory Visit: Payer: Self-pay | Admitting: Physician Assistant

## 2011-12-12 ENCOUNTER — Encounter: Payer: Self-pay | Admitting: Internal Medicine

## 2011-12-22 ENCOUNTER — Ambulatory Visit (INDEPENDENT_AMBULATORY_CARE_PROVIDER_SITE_OTHER): Payer: Self-pay | Admitting: *Deleted

## 2011-12-22 ENCOUNTER — Ambulatory Visit (HOSPITAL_COMMUNITY)
Admission: RE | Admit: 2011-12-22 | Discharge: 2011-12-22 | Disposition: A | Discharge status: Home / Self Care | Payer: No Typology Code available for payment source | Source: Ambulatory Visit | Attending: Internal Medicine | Admitting: Internal Medicine

## 2011-12-22 ENCOUNTER — Other Ambulatory Visit: Payer: Self-pay | Admitting: Physician Assistant

## 2011-12-22 ENCOUNTER — Encounter (HOSPITAL_COMMUNITY): Payer: Self-pay

## 2011-12-22 VITALS — BP 98/52 | HR 54 | Wt 153.8 lb

## 2011-12-22 DIAGNOSIS — R079 Chest pain, unspecified: Secondary | ICD-10-CM | POA: Insufficient documentation

## 2011-12-22 DIAGNOSIS — I5022 Chronic systolic (congestive) heart failure: Secondary | ICD-10-CM

## 2011-12-22 DIAGNOSIS — I509 Heart failure, unspecified: Secondary | ICD-10-CM | POA: Insufficient documentation

## 2011-12-22 DIAGNOSIS — I4892 Unspecified atrial flutter: Secondary | ICD-10-CM | POA: Insufficient documentation

## 2011-12-22 DIAGNOSIS — Z7901 Long term (current) use of anticoagulants: Secondary | ICD-10-CM

## 2011-12-22 DIAGNOSIS — I10 Essential (primary) hypertension: Secondary | ICD-10-CM | POA: Insufficient documentation

## 2011-12-22 DIAGNOSIS — I517 Cardiomegaly: Secondary | ICD-10-CM | POA: Insufficient documentation

## 2011-12-22 DIAGNOSIS — R0989 Other specified symptoms and signs involving the circulatory and respiratory systems: Secondary | ICD-10-CM

## 2011-12-22 LAB — COMPREHENSIVE METABOLIC PANEL
Albumin: 3.6 g/dL (ref 3.5–5.2)
BUN: 40 mg/dL — ABNORMAL HIGH (ref 6–23)
Calcium: 8.8 mg/dL (ref 8.4–10.5)
Creatinine, Ser: 2.17 mg/dL — ABNORMAL HIGH (ref 0.50–1.35)
Total Bilirubin: 0.4 mg/dL (ref 0.3–1.2)
Total Protein: 6.7 g/dL (ref 6.0–8.3)

## 2011-12-22 LAB — T4, FREE: Free T4: 1.77 ng/dL (ref 0.80–1.80)

## 2011-12-22 LAB — TSH: TSH: 2.03 u[IU]/mL (ref 0.350–4.500)

## 2011-12-22 LAB — PROTIME-INR: Prothrombin Time: 23.8 seconds — ABNORMAL HIGH (ref 11.6–15.2)

## 2011-12-22 LAB — T3: T3, Total: 41.7 ng/dl — ABNORMAL LOW (ref 80.0–204.0)

## 2011-12-22 NOTE — Assessment & Plan Note (Addendum)
Maintaining NSR. Continue amio and coumadin. Check LFTs/TFTs/CXR for amio surveillance.

## 2011-12-22 NOTE — Assessment & Plan Note (Addendum)
Doing quite well. NYHA II-III. Weight up a little bit but I do not see any fluid on exam. Suspect may be just do to his diet. Told him to watch weight closely and can take extra dose of lasix. If that is happening a lot needs to call us. Will continue current regimen. Unable to titrate b-blocker due to low BP and bradycardia. Check echo in 3 months.

## 2011-12-22 NOTE — Progress Notes (Signed)
ADVANCED HEART FAILURE NOTE  HPI:  [name] is a 76 y/o male with h/o cognitive impairment, renal insufficiency (baseline about 1.5-1.6), severe CHF due to NICM (EF 15-20%), atrial flutter (s/p DC-CV in April 2012) and LBBB.   12/2010: Coronary angiograms showed insignifcant CAD. Right heart cath (on milrinone) showed a right atrial pressure of about 12, PA pressure was 45/79 with a mean of 27,pulmonary capillary wedge pressure was 12, LV pressure was 85/5 with an EDP of about 8.  Fick cardiac output was 4.4 with a cardiac index of 2.3.  Pulmonary artery saturations were 61 and 63%.  Pulmonary vascular resistance was 3.1 Woods units.   12/2010: TEE-guided cardioversion that showed an EF of about 15-20%.  There was no evidence of left atrial appendage thrombus.  He was successfully cardioverted with 1 shock.  He was started on amiodarone to help maintain sinus rhythm.Given his LBBB we discussed the possibility of BiV-ICD but decided to defer as he was doing so well.  02/2011: ECHO with EF 123XX123, Grade 1 diastolic dysfunction, ventricular septum dyssynergy.  Mildly reduced RV systolic function.    He is here for routine follow up today.     Feels great. BP soft but denies dizziness. Denies SOB/PND/Orthopnea. Weight at home creeping up was in 145 rang and now 150. Denies edema. Says he is eating real wel  Able to walk around the farm and do all ADLs without dyspnea.  No bleeding with coumadin.     ROS: All systems negative except as listed in HPI, PMH and Problem List.  Past Medical History  Diagnosis Date  . Cardiomyopathy     Nonischemic, LVEF 20%  . Left bundle branch block   . Chronic renal insufficiency   . Hypothyroidism   . Scoliosis   . Mixed hyperlipidemia     Current Outpatient Prescriptions  Medication Sig Dispense Refill  . aspirin 81 MG tablet Take 81 mg by mouth daily.        . carvedilol (COREG) 6.25 MG tablet TAKE ONE TABLET BY MOUTH TWICE DAILY WITH MEALS  60 tablet  5  .  cetirizine (ZYRTEC) 10 MG tablet Take 10 mg by mouth daily.      [name] DIOVAN 40 MG tablet TAKE ONE TABLET BY MOUTH TWICE DAILY  60 each  10  . furosemide (LASIX) 40 MG tablet Take 1 tablet (40 mg total) by mouth 2 (two) times daily.  60 tablet  6  . hydrALAZINE (APRESOLINE) 25 MG tablet Take 1 tablet (25 mg total) by mouth 3 (three) times daily.  90 tablet  11  . isosorbide mononitrate (IMDUR) 30 MG 24 hr tablet TAKE ONE TABLET BY MOUTH EVERY DAY  30 tablet  8  . levothyroxine (SYNTHROID, LEVOTHROID) 150 MCG tablet Take 150 mcg by mouth daily.        [name] PACERONE 200 MG tablet TAKE ONE TABLET BY MOUTH EVERY DAY  30 each  6  . spironolactone (ALDACTONE) 25 MG tablet TAKE ONE TABLET BY MOUTH EVERY DAY  30 tablet  8  . warfarin (COUMADIN) 3 MG tablet Take 3 mg by mouth as directed. Take 1/2 tablet on Sun, Mon, Wed, Fri.  Take whole tablet all other days.         PHYSICAL EXAM: Filed Vitals:   12/22/11 1039  BP: 88/44  Pulse: 54  Weight: 153 lb 12 oz (69.741 kg)  SpO2: 96%    General:  Well appearing. No resp difficulty. Walks around clinic without  difficulty. Kyphotic HEENT: normal Neck: supple. JVP flat. Carotids 2+ bilaterally; no bruits. No lymphadenopathy or thryomegaly appreciated. Cor: PMI laterally displace. Brady regular No rubs, gallops or murmurs. Split S2 Lungs: Diminished in bases Abdomen: soft, nontender, nondistended. No hepatosplenomegaly. No bruits or masses. Good bowel sounds. Extremities: no cyanosis, clubbing, rash. No edema. Stockings on.  Neuro: alert & orientedx3, cranial nerves grossly intact. Moves all 4 extremities w/o difficulty. Affect pleasant. Skin: L cheek intact scab .5 x.5cm   ASSESSMENT & PLAN:

## 2011-12-22 NOTE — Patient Instructions (Signed)
Labs today  Your physician has requested that you have an echocardiogram. Echocardiography is a painless test that uses sound waves to create images of your heart. It provides your doctor with information about the size and shape of your heart and how well your heart's chambers and valves are working. This procedure takes approximately one hour. There are no restrictions for this procedure.  We will contact you in 3 months to schedule your next appointment.

## 2012-01-20 ENCOUNTER — Ambulatory Visit (INDEPENDENT_AMBULATORY_CARE_PROVIDER_SITE_OTHER): Payer: No Typology Code available for payment source | Admitting: *Deleted

## 2012-01-20 DIAGNOSIS — I4892 Unspecified atrial flutter: Secondary | ICD-10-CM

## 2012-01-20 DIAGNOSIS — Z7901 Long term (current) use of anticoagulants: Secondary | ICD-10-CM

## 2012-01-24 ENCOUNTER — Emergency Department (HOSPITAL_COMMUNITY): Payer: No Typology Code available for payment source

## 2012-01-24 ENCOUNTER — Encounter (HOSPITAL_COMMUNITY): Payer: Self-pay

## 2012-01-24 ENCOUNTER — Emergency Department (HOSPITAL_COMMUNITY)
Admission: EM | Admit: 2012-01-24 | Discharge: 2012-01-24 | Disposition: A | Discharge status: Home / Self Care | Payer: No Typology Code available for payment source | Attending: Emergency Medicine | Admitting: Emergency Medicine

## 2012-01-24 DIAGNOSIS — M4120 Other idiopathic scoliosis, site unspecified: Secondary | ICD-10-CM | POA: Insufficient documentation

## 2012-01-24 DIAGNOSIS — Z7982 Long term (current) use of aspirin: Secondary | ICD-10-CM | POA: Insufficient documentation

## 2012-01-24 DIAGNOSIS — E039 Hypothyroidism, unspecified: Secondary | ICD-10-CM | POA: Insufficient documentation

## 2012-01-24 DIAGNOSIS — R079 Chest pain, unspecified: Secondary | ICD-10-CM | POA: Insufficient documentation

## 2012-01-24 DIAGNOSIS — I428 Other cardiomyopathies: Secondary | ICD-10-CM | POA: Insufficient documentation

## 2012-01-24 DIAGNOSIS — R0789 Other chest pain: Secondary | ICD-10-CM

## 2012-01-24 DIAGNOSIS — E782 Mixed hyperlipidemia: Secondary | ICD-10-CM | POA: Insufficient documentation

## 2012-01-24 DIAGNOSIS — Z79899 Other long term (current) drug therapy: Secondary | ICD-10-CM | POA: Insufficient documentation

## 2012-01-24 DIAGNOSIS — N289 Disorder of kidney and ureter, unspecified: Secondary | ICD-10-CM

## 2012-01-24 DIAGNOSIS — Z7901 Long term (current) use of anticoagulants: Secondary | ICD-10-CM | POA: Insufficient documentation

## 2012-01-24 DIAGNOSIS — I498 Other specified cardiac arrhythmias: Secondary | ICD-10-CM | POA: Insufficient documentation

## 2012-01-24 DIAGNOSIS — N189 Chronic kidney disease, unspecified: Secondary | ICD-10-CM | POA: Insufficient documentation

## 2012-01-24 LAB — URINALYSIS, ROUTINE W REFLEX MICROSCOPIC
Glucose, UA: NEGATIVE mg/dL
Protein, ur: NEGATIVE mg/dL

## 2012-01-24 LAB — DIFFERENTIAL
Basophils Relative: 1 % (ref 0–1)
Eosinophils Absolute: 0.1 10*3/uL (ref 0.0–0.7)
Monocytes Relative: 8 % (ref 3–12)
Neutrophils Relative %: 77 % (ref 43–77)

## 2012-01-24 LAB — CBC
MCH: 32.6 pg (ref 26.0–34.0)
MCHC: 33.5 g/dL (ref 30.0–36.0)
Platelets: 163 10*3/uL (ref 150–400)
RDW: 12.4 % (ref 11.5–15.5)

## 2012-01-24 LAB — BASIC METABOLIC PANEL
BUN: 38 mg/dL — ABNORMAL HIGH (ref 6–23)
GFR calc Af Amer: 27 mL/min — ABNORMAL LOW (ref >90)
GFR calc non Af Amer: 23 mL/min — ABNORMAL LOW (ref >90)
Potassium: 4.4 mEq/L (ref 3.5–5.1)
Sodium: 135 mEq/L (ref 135–145)

## 2012-01-24 LAB — URINE MICROSCOPIC-ADD ON

## 2012-01-24 LAB — PROTIME-INR
INR: 1.78 — ABNORMAL HIGH (ref 0.00–1.49)
Prothrombin Time: 21 seconds — ABNORMAL HIGH (ref 11.6–15.2)

## 2012-01-24 LAB — CARDIAC PANEL(CRET KIN+CKTOT+MB+TROPI)
Relative Index: INVALID (ref 0.0–2.5)
Troponin I: <0.3 ng/mL (ref <0.30)

## 2012-01-24 MED ORDER — ASPIRIN 81 MG PO CHEW
324.0000 mg | CHEWABLE_TABLET | Freq: Once | ORAL | Status: AC
  Administered 2012-01-24: 324 mg via ORAL
  Filled 2012-01-24: qty 4

## 2012-01-24 NOTE — ED Provider Notes (Signed)
History   This chart was scribed for Ezequiel Essex, MD by Kathreen Cornfield. The patient was seen in room APA12/APA12 and the patient's care was started at 12:14 PM     CSN: [id number]  Arrival date & time 01/24/12  1207   First MD Initiated Contact with Patient 01/24/12 1210      Chief Complaint  Patient presents with  . Chest Pain  . Bradycardia    (Consider location/radiation/quality/duration/timing/severity/associated sxs/prior treatment) HPI  [name] is a 76 y.o. male who presents to the Emergency Department complaining of moderate, episodic chest pain onset today with associated symptoms of bradycardia. The pt states "he began sweating and went to the bathroom, and at one point he could not see anything." Pt informs the EDP that the chest hurts "when you touch it." Pt states "he has not experienced symptoms like these in a long time." Pt has a hx of cardiomyopathy.   Pt denies MI, abdominal pain, back pain, stents in his heart.   PCP is Dr. Willey Blade. Cardiologist is Dr. Luberta Mutter in [place], [place].   Past Medical History  Diagnosis Date  . Cardiomyopathy     Nonischemic, LVEF 20%  . Left bundle branch block   . Chronic renal insufficiency   . Hypothyroidism   . Scoliosis   . Mixed hyperlipidemia     Past Surgical History  Procedure Date  . Laparoscopic cholecystectomy 2008  . Right inguinal herniorrhaphy     Family History  Problem Relation Age of Onset  . Heart disease Father     Died age 82  . Stroke Mother     Died age 43    History  Substance Use Topics  . Smoking status: Never Smoker   . Smokeless tobacco: Never Used  . Alcohol Use: No      Review of Systems  All other systems reviewed and are negative.    10 Systems reviewed and all are negative for acute change except as noted in the HPI.    Allergies  Review of patient's allergies indicates no known allergies.  Home Medications   Current Outpatient Rx  Name Route Sig Dispense  Refill  . ASPIRIN 81 MG PO TABS Oral Take 81 mg by mouth daily.      [name] CARVEDILOL 6.25 MG PO TABS  TAKE ONE TABLET BY MOUTH TWICE DAILY WITH MEALS 60 tablet 5  . CETIRIZINE HCL 10 MG PO TABS Oral Take 10 mg by mouth daily.    [name] DIOVAN 40 MG PO TABS  TAKE ONE TABLET BY MOUTH TWICE DAILY 60 each 10  . FUROSEMIDE 40 MG PO TABS Oral Take 1 tablet (40 mg total) by mouth 2 (two) times daily. 60 tablet 6    NEED FOLLOW UP APPOINTMENT WITH [facility]  . HYDRALAZINE HCL 25 MG PO TABS Oral Take 1 tablet (25 mg total) by mouth 3 (three) times daily. 90 tablet 11  . ISOSORBIDE MONONITRATE ER 30 MG PO TB24  TAKE ONE TABLET BY MOUTH EVERY DAY 30 tablet 8  . LEVOTHYROXINE SODIUM 150 MCG PO TABS Oral Take 150 mcg by mouth daily.     [name] PACERONE 200 MG PO TABS  TAKE ONE TABLET BY MOUTH EVERY DAY 30 each 6  . SPIRONOLACTONE 25 MG PO TABS  TAKE ONE TABLET BY MOUTH EVERY DAY 30 tablet 8  . WARFARIN SODIUM 3 MG PO TABS Oral Take 1.5-3 mg by mouth daily. Take as directed by Anticoagulation clinic. Last visit on 01/20/12.  Current doses: Take 1.5mg  (0.5 tablet) on Sunday, Monday, Wednesday, and Friday. Take 3 mg (1 tablet) on Tuesday, Thursday, and Saturday.        Pulse 53  Resp 20  Ht 5\' 6"  (1.676 m)  Wt 160 lb (72.576 kg)  BMI 25.82 kg/m2  SpO2 97%  Physical Exam  Nursing note and vitals reviewed. Constitutional: He appears well-developed and well-nourished.  HENT:  Head: Atraumatic.  Right Ear: External ear normal.  Left Ear: External ear normal.  Nose: Nose normal.  Eyes: Conjunctivae and EOM are normal.  Neck: Normal range of motion. Neck supple.  Cardiovascular: Normal rate, regular rhythm and normal heart sounds.   No murmur heard. Pulmonary/Chest: Effort normal and breath sounds normal. No respiratory distress. He exhibits tenderness (left anterior chest wall tenderness.).  Abdominal: Soft.  Musculoskeletal: Normal range of motion.  Neurological: He is alert.  Skin: Skin is warm and dry.    Psychiatric: He has a normal mood and affect. His behavior is normal.    ED Course  Procedures (including critical care time)  12:19PM- EDP at bedside discusses treatment plan.   DIAGNOSTIC STUDIES: Oxygen Saturation is 97% on room ait, normal by my interpretation.    COORDINATION OF CARE:     Labs Reviewed  CBC - Abnormal; Notable for the following:    RBC 3.84 (*)    Hemoglobin 12.5 (*)    HCT 37.3 (*)    All other components within normal limits  BASIC METABOLIC PANEL - Abnormal; Notable for the following:    BUN 38 (*)    Creatinine, Ser 2.56 (*)    GFR calc non Af Amer 23 (*)    GFR calc Af Amer 27 (*)    All other components within normal limits  PRO B NATRIURETIC PEPTIDE - Abnormal; Notable for the following:    Pro B Natriuretic peptide (BNP) 368.2 (*)    All other components within normal limits  URINALYSIS, ROUTINE W REFLEX MICROSCOPIC - Abnormal; Notable for the following:    Specific Gravity, Urine <1.005 (*)    Hgb urine dipstick TRACE (*)    All other components within normal limits  PROTIME-INR - Abnormal; Notable for the following:    Prothrombin Time 21.0 (*)    INR 1.78 (*)    All other components within normal limits  URINE MICROSCOPIC-ADD ON - Abnormal; Notable for the following:    Squamous Epithelial / LPF FEW (*)    Bacteria, UA FEW (*)    Casts GRANULAR CAST (*)    All other components within normal limits  DIFFERENTIAL  CARDIAC PANEL(CRET KIN+CKTOT+MB+TROPI)  CARDIAC PANEL(CRET KIN+CKTOT+MB+TROPI)   Dg Chest 2 View  01/24/2012  *RADIOLOGY REPORT*  Clinical Data: Cardiomyopathy  CHEST - 2 VIEW  Comparison: Chest radiograph 12/22/2011  Findings: Stable enlarged cardiac silhouette.  There are low lung volumes.  No effusion, infiltrate, or pneumothorax.  Significant kyphosis of the thoracic spine.  IMPRESSION: Cardiomegaly without acute cardiopulmonary findings.  Original Report Authenticated By: Suzy Bouchard, M.D.     No diagnosis  found.    MDM  Reproducible chest wall pain with history of cardiomyopathy with EF of 25%. Lungs clear bilaterally, no JVD, no peripheral edema. Reproducible chest pain is migrating across his chest and atypical for ischemia. Bradycardia similar to previous.  NO EKG changes.  Cr elevated 2.5 from baseline 2.1.  INR subtherapeutic. No hypoxia or tachycardia, no pleuritic nature to pain to suggest PE.  CXR negative.  No volume  overload on exam.  Delta troponin negative.  Ambulated without assistance or desaturation. Advised patient to have his renal function and INR checked by Dr. Willey Blade early next week. He is anxious to go home.   Date: 01/24/2012  Rate: 49  Rhythm: sinus bradycardia  QRS Axis: normal  Intervals: PR prolonged  ST/T Wave abnormalities: nonspecific ST/T changes  Conduction Disutrbances:left bundle branch block  Narrative Interpretation:   Old EKG Reviewed: unchanged       I personally performed the services described in this documentation, which was scribed in my presence.  The recorded information has been reviewed and considered.    Ezequiel Essex, MD 01/24/12 [phone]

## 2012-01-24 NOTE — Discharge Instructions (Signed)
Chest Pain (Nonspecific) There is no evidence of a heart attack or blood clot in the lung. Followup with Dr. Willey Blade to check your kidney function and Coumadin level next week. Return to the ED if you develop new or worsening symptoms. It is often hard to give a specific diagnosis for the cause of chest pain. There is always a chance that your pain could be related to something serious, such as a heart attack or a blood clot in the lungs. You need to follow up with your caregiver for further evaluation. CAUSES   Heartburn.   Pneumonia or bronchitis.   Anxiety or stress.   Inflammation around your heart (pericarditis) or lung (pleuritis or pleurisy).   A blood clot in the lung.   A collapsed lung (pneumothorax). It can develop suddenly on its own (spontaneous pneumothorax) or from injury (trauma) to the chest.   Shingles infection (herpes zoster virus).  The chest wall is composed of bones, muscles, and cartilage. Any of these can be the source of the pain.  The bones can be bruised by injury.   The muscles or cartilage can be strained by coughing or overwork.   The cartilage can be affected by inflammation and become sore (costochondritis).  DIAGNOSIS  Lab tests or other studies, such as X-rays, electrocardiography, stress testing, or cardiac imaging, may be needed to find the cause of your pain.  TREATMENT   Treatment depends on what may be causing your chest pain. Treatment may include:   Acid blockers for heartburn.   Anti-inflammatory medicine.   Pain medicine for inflammatory conditions.   Antibiotics if an infection is present.   You may be advised to change lifestyle habits. This includes stopping smoking and avoiding alcohol, caffeine, and chocolate.   You may be advised to keep your head raised (elevated) when sleeping. This reduces the chance of acid going backward from your stomach into your esophagus.   Most of the time, nonspecific chest pain will improve within  2 to 3 days with rest and mild pain medicine.  HOME CARE INSTRUCTIONS   If antibiotics were prescribed, take your antibiotics as directed. Finish them even if you start to feel better.   For the next few days, avoid physical activities that bring on chest pain. Continue physical activities as directed.   Do not smoke.   Avoid drinking alcohol.   Only take over-the-counter or prescription medicine for pain, discomfort, or fever as directed by your caregiver.   Follow your caregiver's suggestions for further testing if your chest pain does not go away.   Keep any follow-up appointments you made. If you do not go to an appointment, you could develop lasting (chronic) problems with pain. If there is any problem keeping an appointment, you must call to reschedule.  SEEK MEDICAL CARE IF:   You think you are having problems from the medicine you are taking. Read your medicine instructions carefully.   Your chest pain does not go away, even after treatment.   You develop a rash with blisters on your chest.  SEEK IMMEDIATE MEDICAL CARE IF:   You have increased chest pain or pain that spreads to your arm, neck, jaw, back, or abdomen.   You develop shortness of breath, an increasing cough, or you are coughing up blood.   You have severe back or abdominal pain, feel nauseous, or vomit.   You develop severe weakness, fainting, or chills.   You have a fever.  THIS IS AN EMERGENCY. Do  not wait to see if the pain will go away. Get medical help at once. Call your local emergency services (911 in [place] Do not drive yourself to the hospital. MAKE SURE YOU:   Understand these instructions.   Will watch your condition.   Will get help right away if you are not doing well or get worse.  Document Released: 06/11/2005 Document Revised: 08/21/2011 Document Reviewed: 04/06/2008 [facility] Patient Information 2012 [place].Kidney Failure In kidney failure, the kidneys lose their ability to  filter enough waste products from the blood. They also lose the ability to regulate the body's balance of salt and water. Eventually, the kidneys slow their production of urine or stop producing it completely. Waste products and water gather in the body. This can lead to a life-threatening overload of fluids (such as heart failure). It can also lead to a dangerous buildup of waste products in the blood. These extreme changes in blood chemistry can affect the function of the heart and brain.  TYPES OF KIDNEY FAILURE Acute kidney failure. In this form of kidney failure, the kidneys stop working properly because of a sudden illness, a medicine, or a medical condition that causes one of the following:   A severe drop in blood pressure or an interruption in the normal blood flow to the kidneys. This can occur during:   Major surgery.   Severe burns with fluid loss.   Massive bleeding.   A heart attack that severely affects heart function.   Blood clots that travel to the kidney.   Direct damage to kidney cells or to the kidneys' filtering units. This can be caused by:   An inflammation of the kidneys.   Toxic chemicals.   Medicines or infections.   Blocked urine flow from the kidney. This can occur because of obstructions outside the kidney, such as:   Kidney stones.   Bladder tumors.   An enlarged prostate.  Blockage of urine flow within the kidney can also cause sudden kidney failure, as can occur with large muscle injuries.  Chronic kidney failure. In this form of kidney failure, the kidney gradually loses function. This happens over a period of years. It is a slow and gradual loss of the ability of the kidneys to send out wastes, concentrate urine, and conserve the salts in your blood. Some of the causes of chronic kidney failure are:  Diabetes (very common cause).   Polycystic kidney disease.   Glomerulonephritis.   Alport syndrome.   The flow of urine out of the kidney is  blocked (obstructive uropathy).   High blood pressure (very common).   Long-term exposure to lead, mercury, and other chemicals and medicines.   Kidney stones with infection.   Reflux nephropathy.   Pain medicine overuse.  Some forms of chronic kidney failure run in families. Your caregiver will ask you about family medical problems.  End-stage kidney disease (ESKD). This is also called end-stage kidney failure. In ESKD, kidney function worsens until the person dies. This is usually the result of longstanding chronic kidney failure, but sometimes it follows acute kidney failure. SYMPTOMS  Symptoms vary depending on the type of kidney failure.   Acute kidney failure. Symptoms include:   Swelling (edema) resulting from salt and water overload.   High blood pressure.   Vomiting.   Tiredness (lethargy) caused by the toxic effects of waste products on brain function.   Feeling sick to your stomach (nauseous).   Decreased urine output.   Chronic kidney failure  and ERSD. Because the kidney damage in chronic kidney failure occurs slowly over a long time, symptoms develop slowly. Symptoms can include:   Headache.   Weakness.   Itching.   Vomiting.   Pale skin.   Slowing of growth in children.   Fatigue.   Tiredness (lethargy).   Poor appetite.   Increased thirst.   High blood pressure.   Bone damage in adults.  DIAGNOSIS  If you have an illness or medical condition that increases the risk of acute kidney failure, your caregivers will watch you closely. You may have blood and urine tests that measure the function of your kidneys. If you have a medical condition that increases the risk of long-term kidney damage, your caregiver will check your blood pressure and look for symptoms of chronic kidney failure during rechecks. TREATMENT  Treatment depends on the type of kidney failure.   Acute kidney failure. Treatment begins with measures to correct the cause of kidney  failure (shock, hemorrhage, burns, heart attack). After this has begun, more specific kidney treatment may include:   Fluids given through the vein (intravenously) to correct any abnormal fluid loss.   Medicines called diuretics that increase urine output.   Limited fluids by mouth.   A diet low in protein and high in carbohydrates.   Medicines to adjust high or low levels of blood chemicals, such as potassium and medicines to control high blood pressure.   Short-term dialysis may be necessary if the patient develops severe high blood pressure, severe fluid overload, heart failure, symptoms of altered brain function, or severe abnormalities in blood chemistry.   Chronic kidney failure. People with chronic kidney failure are watched closely. They receive frequent physical exams, blood pressure checks, and blood testing. Treatment includes:   A low-protein and low-salt diet.   Medicines to adjust blood chemical levels.   Medicines to treat high blood pressure.   Sometimes, a hormonal medicine called erythropoietin is given to correct a low level of red blood cells (anemia).   ESKD. Treatment includes:   Dialysis until a donor can be found for a kidney transplant. Dialysis mechanically removes waste products from the blood.   Both kidneys may need to be removed surgically before a transplant in patients with severe high blood pressure or chronic pyelonephritis.  PROGNOSIS   Acute kidney failure may go away on its own. Some people recover within a matter of days. Exactly how long the illness lasts varies greatly from person-to-person. The duration depends on the cause of the kidney problem. In rare cases, acute kidney failure progresses to ESKD. Among people who recover, about 50% have some permanent kidney damage. In most cases, this is not severe enough to prevent you from living a normal life.   Chronic kidney failure is a lifelong problem that can worsen over time to become ESKD. Not  everyone develops ESKD. For those who do, the time it takes for ESKD to develop varies from person-to-person.   ESKD is a permanent condition that can be treated only with dialysis or a kidney transplant.  PREVENTION  Many forms of kidney failure cannot be prevented. People who have diabetes, high blood pressure, or coronary artery disease should try to control the illness with:  Appropriate diet.   Medicine.   Lifestyle changes.  If you have chronic kidney failure, you should tell all caregivers who treat you.  HOME CARE INSTRUCTIONS   Follow your diet and take your medicines as instructed.   Do not use  any new medicines (prescription, over-the-counter, or nutritional supplements) unless approved by your caregiver. Many medicines can worsen your kidney damage or need to have the dose adjusted.   If dialysis is scheduled, keep all appointments. Call if you are unable to keep an appointment.  SEEK MEDICAL CARE IF:   You develop unexplained weakness, tiredness, or appetite loss.   You feel poorly with no clear explanation.  SEEK IMMEDIATE MEDICAL CARE IF:   The amount of urine you produce either distinctly increases or decreases.   You develop swelling of the face and/or ankles.   You develop shortness of breath.  Joanna of Diabetes and Digestive and Kidney Diseases: [employer] National Kidney Foundation: www.kidney.org Document Released: 09/01/2005 Document Revised: 08/21/2011 Document Reviewed: 01/02/2010 [facility] Patient Information 2012 [place], [place].

## 2012-01-24 NOTE — ED Notes (Signed)
Ambulated pt to restroom and back pt denied any cp at this time and pt denies any sob. Pt states he walks with walker at home and needed minimal assistance pt o2 sat never dropped below  96 % while walking. [name]

## 2012-01-24 NOTE — ED Notes (Signed)
Pt presents with palp chest pain and bradycardia. Per EMS rhythm junctional. Pt a/ox4. Resp even and unlabored.

## 2012-01-29 ENCOUNTER — Ambulatory Visit (HOSPITAL_COMMUNITY)
Admission: RE | Admit: 2012-01-29 | Discharge: 2012-01-29 | Disposition: A | Discharge status: Home / Self Care | Payer: No Typology Code available for payment source | Source: Ambulatory Visit | Attending: Internal Medicine | Admitting: Internal Medicine

## 2012-01-29 ENCOUNTER — Encounter (HOSPITAL_COMMUNITY): Payer: Self-pay

## 2012-01-29 VITALS — BP 100/42 | HR 52 | Ht 68.0 in | Wt 155.8 lb

## 2012-01-29 DIAGNOSIS — I5022 Chronic systolic (congestive) heart failure: Secondary | ICD-10-CM | POA: Insufficient documentation

## 2012-01-29 NOTE — Assessment & Plan Note (Addendum)
He is here for follow up post ED visit a few days. Volume status remains stable on Lasix 40 mg bid. Reviewed BMET result from 01/24/12. Creatinine elevated to 2.5. Will repeat BMET next week. Continue current regimen. Follow up in 3 months.   Patient seen and examined with Darrick Grinder, NP. We discussed all aspects of the encounter. I agree with the assessment and plan as stated above. He is doing well. Volume status looks good. Cr up just a little bit. Functional status preserved. He and his family not interested in ICD. Will continue current regimen. Recheck BMET next week. Reinforced need for daily weights and reviewed use of sliding scale diuretics.

## 2012-01-29 NOTE — Patient Instructions (Signed)
Follow up in 3 months

## 2012-01-29 NOTE — Progress Notes (Signed)
Patient ID: [name], male   DOB: [birth year], 76 y.o.   MRN: [id number] ADVANCED HEART FAILURE NOTE HPI:  [name] is a 76 y/o male with h/o cognitive impairment, renal insufficiency (baseline about 1.5-1.6), severe CHF due to NICM (EF 15-20%), atrial flutter (s/p DC-CV in April 2012) and LBBB.   12/2010: Coronary angiograms showed insignifcant CAD. Right heart cath (on milrinone) showed a right atrial pressure of about 12, PA pressure was 45/79 with a mean of 27,pulmonary capillary wedge pressure was 12, LV pressure was 85/5 with an EDP of about 8.  Fick cardiac output was 4.4 with a cardiac index of 2.3.  Pulmonary artery saturations were 61 and 63%.  Pulmonary vascular resistance was 3.1 Woods units.   12/2010: TEE-guided cardioversion that showed an EF of about 15-20%.  There was no evidence of left atrial appendage thrombus.  He was successfully cardioverted with 1 shock.  He was started on amiodarone to help maintain sinus rhythm.Given his LBBB we discussed the possibility of BiV-ICD but decided to defer as he was doing so well.  02/2011: ECHO with EF 123XX123, Grade 1 diastolic dysfunction, ventricular septum dyssynergy.  Mildly reduced RV systolic function.    01/24/12 Evaluated in the ED for chest pain and visual difficulty. Cardiac enzymes negative. Creatinine 2.5 . Treated for atypical chest pain  He is here for routine follow up today. He is feeling much better and has not had any more chest pain. Denies SOB/PND/Orthopnea/CP/Dizziness.  Weight at home 144-148 pounds. No bleeding on coumadin. He is able to walk in the yard perform ADLs without difficulty. Compliant with medications.      ROS: All systems negative except as listed in HPI, PMH and Problem List.  Past Medical History  Diagnosis Date  . Cardiomyopathy     Nonischemic, LVEF 20%  . Left bundle branch block   . Chronic renal insufficiency   . Hypothyroidism   . Scoliosis   . Mixed hyperlipidemia     Current  Outpatient Prescriptions  Medication Sig Dispense Refill  . aspirin 81 MG tablet Take 81 mg by mouth daily.        . carvedilol (COREG) 6.25 MG tablet TAKE ONE TABLET BY MOUTH TWICE DAILY WITH MEALS  60 tablet  5  . cetirizine (ZYRTEC) 10 MG tablet Take 10 mg by mouth daily. Equate Allergy Relief      . DIOVAN 40 MG tablet TAKE ONE TABLET BY MOUTH TWICE DAILY  60 each  10  . furosemide (LASIX) 40 MG tablet Take 1 tablet (40 mg total) by mouth 2 (two) times daily.  60 tablet  6  . hydrALAZINE (APRESOLINE) 25 MG tablet Take 1 tablet (25 mg total) by mouth 3 (three) times daily.  90 tablet  11  . isosorbide mononitrate (IMDUR) 30 MG 24 hr tablet TAKE ONE TABLET BY MOUTH EVERY DAY  30 tablet  8  . levothyroxine (SYNTHROID, LEVOTHROID) 150 MCG tablet Take 150 mcg by mouth daily.       [name] PACERONE 200 MG tablet TAKE ONE TABLET BY MOUTH EVERY DAY  30 each  6  . spironolactone (ALDACTONE) 25 MG tablet TAKE ONE TABLET BY MOUTH EVERY DAY  30 tablet  8  . warfarin (COUMADIN) 3 MG tablet Take 1.5-3 mg by mouth daily. Take as directed by Anticoagulation clinic. Last visit on 01/20/12. Current doses: Take 1.5mg  (0.5 tablet) on Sunday, Monday, Wednesday, and Friday. Take 3 mg (1 tablet) on Tuesday, Thursday, and Saturday.  PHYSICAL EXAM: Filed Vitals:   01/29/12 1443  BP: 100/42  Pulse: 52  Height: 5\' 8"  (1.727 m)  Weight: 155 lb 12.8 oz (70.67 kg)  SpO2: 96%    General:  Well appearing. No resp difficulty. Walks around clinic without difficulty. Brother present Kyphotic HEENT: normal Neck: supple. JVP flat 5-6. Carotids 2+ bilaterally; no bruits. No lymphadenopathy or thryomegaly appreciated. Cor: PMI laterally displace. Rregular No rubs, gallops or murmurs.  Lungs: Diminished in bases Abdomen: soft, nontender, nondistended. No hepatosplenomegaly. No bruits or masses. Good bowel sounds. Extremities: no cyanosis, clubbing, rash. No edema. Stockings on.  Neuro: alert & orientedx3, cranial  nerves grossly intact. Moves all 4 extremities w/o difficulty. Affect pleasant.    ASSESSMENT & PLAN:

## 2012-02-03 ENCOUNTER — Other Ambulatory Visit: Payer: Self-pay | Admitting: Internal Medicine

## 2012-02-04 ENCOUNTER — Other Ambulatory Visit: Payer: Self-pay | Admitting: *Deleted

## 2012-02-04 DIAGNOSIS — I429 Cardiomyopathy, unspecified: Secondary | ICD-10-CM

## 2012-02-05 ENCOUNTER — Other Ambulatory Visit (HOSPITAL_COMMUNITY): Payer: Self-pay | Admitting: Internal Medicine

## 2012-02-05 LAB — BASIC METABOLIC PANEL WITH GFR
CO2: 32 mEq/L (ref 19–32)
Chloride: 102 mEq/L (ref 96–112)
Potassium: 4.4 mEq/L (ref 3.5–5.3)
Sodium: 141 mEq/L (ref 135–145)

## 2012-02-20 ENCOUNTER — Ambulatory Visit (INDEPENDENT_AMBULATORY_CARE_PROVIDER_SITE_OTHER): Payer: No Typology Code available for payment source | Admitting: *Deleted

## 2012-02-20 ENCOUNTER — Other Ambulatory Visit: Payer: Self-pay | Admitting: Internal Medicine

## 2012-02-20 DIAGNOSIS — I4892 Unspecified atrial flutter: Secondary | ICD-10-CM

## 2012-02-20 DIAGNOSIS — Z7901 Long term (current) use of anticoagulants: Secondary | ICD-10-CM

## 2012-02-20 LAB — POCT INR: INR: 2

## 2012-02-20 NOTE — Telephone Encounter (Signed)
Refilled carvedilol.

## 2012-03-16 ENCOUNTER — Other Ambulatory Visit: Payer: Self-pay | Admitting: Internal Medicine

## 2012-03-19 ENCOUNTER — Ambulatory Visit (INDEPENDENT_AMBULATORY_CARE_PROVIDER_SITE_OTHER): Payer: No Typology Code available for payment source | Admitting: *Deleted

## 2012-03-19 DIAGNOSIS — Z7901 Long term (current) use of anticoagulants: Secondary | ICD-10-CM

## 2012-03-19 DIAGNOSIS — I4892 Unspecified atrial flutter: Secondary | ICD-10-CM

## 2012-03-19 LAB — POCT INR: INR: 2.2

## 2012-04-13 ENCOUNTER — Other Ambulatory Visit (HOSPITAL_COMMUNITY): Payer: Self-pay | Admitting: Internal Medicine

## 2012-04-17 ENCOUNTER — Other Ambulatory Visit (HOSPITAL_COMMUNITY): Payer: Self-pay | Admitting: Internal Medicine

## 2012-04-19 NOTE — Telephone Encounter (Signed)
..   Requested Prescriptions   Pending Prescriptions Disp Refills  . furosemide (LASIX) 40 MG tablet [Pharmacy Med Name: FUROSEMIDE 40MG      TAB] 60 tablet 6    Sig: Take 1 tablet (40 mg total) by mouth 2 (two) times daily.

## 2012-04-30 ENCOUNTER — Ambulatory Visit (INDEPENDENT_AMBULATORY_CARE_PROVIDER_SITE_OTHER): Payer: No Typology Code available for payment source | Admitting: *Deleted

## 2012-04-30 DIAGNOSIS — Z7901 Long term (current) use of anticoagulants: Secondary | ICD-10-CM

## 2012-04-30 DIAGNOSIS — I4892 Unspecified atrial flutter: Secondary | ICD-10-CM

## 2012-05-03 ENCOUNTER — Other Ambulatory Visit (HOSPITAL_COMMUNITY): Payer: Self-pay | Admitting: Internal Medicine

## 2012-05-18 ENCOUNTER — Encounter: Payer: Self-pay | Admitting: Internal Medicine

## 2012-05-19 DIAGNOSIS — I447 Left bundle-branch block, unspecified: Secondary | ICD-10-CM | POA: Insufficient documentation

## 2012-05-19 DIAGNOSIS — N400 Enlarged prostate without lower urinary tract symptoms: Secondary | ICD-10-CM | POA: Insufficient documentation

## 2012-05-19 DIAGNOSIS — E039 Hypothyroidism, unspecified: Secondary | ICD-10-CM | POA: Insufficient documentation

## 2012-05-24 ENCOUNTER — Encounter: Payer: Self-pay | Admitting: Internal Medicine

## 2012-05-31 ENCOUNTER — Telehealth (HOSPITAL_COMMUNITY): Payer: Self-pay | Admitting: Cardiology

## 2012-05-31 DIAGNOSIS — I5022 Chronic systolic (congestive) heart failure: Secondary | ICD-10-CM

## 2012-05-31 NOTE — Telephone Encounter (Signed)
Pt is due a follow up ASAP

## 2012-06-02 ENCOUNTER — Other Ambulatory Visit: Payer: Self-pay | Admitting: Internal Medicine

## 2012-06-04 NOTE — Telephone Encounter (Signed)
PT AWARE  

## 2012-06-09 NOTE — Telephone Encounter (Signed)
Will need order follow ECHo. Order placed for scheduled appt

## 2012-06-09 NOTE — Addendum Note (Signed)
Addended by: Kerry Dory on: 06/09/2012 09:13 AM   Modules accepted: Orders

## 2012-06-11 ENCOUNTER — Ambulatory Visit (INDEPENDENT_AMBULATORY_CARE_PROVIDER_SITE_OTHER): Payer: No Typology Code available for payment source | Admitting: *Deleted

## 2012-06-11 DIAGNOSIS — Z7901 Long term (current) use of anticoagulants: Secondary | ICD-10-CM

## 2012-06-11 DIAGNOSIS — I4892 Unspecified atrial flutter: Secondary | ICD-10-CM

## 2012-06-17 ENCOUNTER — Encounter (HOSPITAL_COMMUNITY): Payer: No Typology Code available for payment source

## 2012-06-22 ENCOUNTER — Other Ambulatory Visit (HOSPITAL_COMMUNITY): Payer: No Typology Code available for payment source

## 2012-06-28 ENCOUNTER — Ambulatory Visit (HOSPITAL_COMMUNITY)
Admission: RE | Admit: 2012-06-28 | Discharge: 2012-06-28 | Disposition: A | Discharge status: Home / Self Care | Payer: No Typology Code available for payment source | Source: Ambulatory Visit | Attending: Internal Medicine | Admitting: Internal Medicine

## 2012-06-28 ENCOUNTER — Ambulatory Visit (HOSPITAL_BASED_OUTPATIENT_CLINIC_OR_DEPARTMENT_OTHER)
Admission: RE | Admit: 2012-06-28 | Discharge: 2012-06-28 | Disposition: A | Discharge status: Home / Self Care | Payer: No Typology Code available for payment source | Source: Ambulatory Visit | Attending: Internal Medicine | Admitting: Internal Medicine

## 2012-06-28 VITALS — BP 102/52 | HR 50 | Wt 154.2 lb

## 2012-06-28 DIAGNOSIS — I4892 Unspecified atrial flutter: Secondary | ICD-10-CM

## 2012-06-28 DIAGNOSIS — I359 Nonrheumatic aortic valve disorder, unspecified: Secondary | ICD-10-CM | POA: Insufficient documentation

## 2012-06-28 DIAGNOSIS — I5022 Chronic systolic (congestive) heart failure: Secondary | ICD-10-CM | POA: Insufficient documentation

## 2012-06-28 MED ORDER — LOSARTAN POTASSIUM 50 MG PO TABS: 50.0000 mg | ORAL_TABLET | Freq: Every day | ORAL | Status: DC

## 2012-06-28 MED ORDER — LOSARTAN POTASSIUM 25 MG PO TABS: 25.0000 mg | ORAL_TABLET | Freq: Every day | ORAL | Status: DC

## 2012-06-28 NOTE — Progress Notes (Signed)
  Echocardiogram 2D Echocardiogram has been performed.  Diamond Nickel 06/28/2012, 10:57 AM

## 2012-06-28 NOTE — Patient Instructions (Addendum)
Follow up in 4 months  Stop taking Diovan  Taking Losartan 50 mg once a day.    Do the following things EVERYDAY: 1) Weigh yourself in the morning before breakfast. Write it down and keep it in a log. 2) Take your medicines as prescribed 3) Eat low salt foods-Limit salt (sodium) to 2000 mg per day.  4) Stay as active as you can everyday 5) Limit all fluids for the day to less than 2 liters

## 2012-06-28 NOTE — Assessment & Plan Note (Addendum)
NYHA II. Volume status stable. Will switch from Diovan to Losartan 50 mg daily to decrease medication cost. He is instructed to take 1/2 Losartan tablet if he develops dizziness. ECHO reviewed and discussed with Zailen and his brother. EF unchanged. Follow up in 4 months.   Patient seen and examined with Darrick Grinder, NP. We discussed all aspects of the encounter. I agree with the assessment and plan as stated above. Echo reviewed personally. EF stable. Doing very well. Volume status looks very good. Agree with med changes as above. We have discussed ICD/CRT in past and he refuses.

## 2012-06-28 NOTE — Progress Notes (Signed)
Patient ID: [name], male   DOB: 1935/12/15, 76 y.o.   MRN: [id number] ADVANCED HEART FAILURE NOTE HPI:  [name] is a 76 y/o male with h/o cognitive impairment, renal insufficiency (baseline about 1.5-1.6), severe CHF due to NICM (EF 15-20%), atrial flutter (s/p DC-CV in April 2012) and LBBB. He and his family not interested in ICD.    12/2010: Coronary angiograms showed insignifcant CAD. Right heart cath (on milrinone) showed a right atrial pressure of about 12, PA pressure was 45/79 with a mean of 27,pulmonary capillary wedge pressure was 12, LV pressure was 85/5 with an EDP of about 8.  Fick cardiac output was 4.4 with a cardiac index of 2.3.  Pulmonary artery saturations were 61 and 63%.  Pulmonary vascular resistance was 3.1 Woods units.   12/2010: TEE-guided cardioversion that showed an EF of about 15-20%.  There was no evidence of left atrial appendage thrombus.  He was successfully cardioverted with 1 shock.  He was started on amiodarone to help maintain sinus rhythm.Given his LBBB we discussed the possibility of BiV-ICD but decided to defer as he was doing so well.  02/2011: ECHO with EF 123XX123, Grade 1 diastolic dysfunction, ventricular septum dyssynergy.  Mildly reduced RV systolic function.    01/24/12 Evaluated in the ED for chest pain and visual difficulty. Cardiac enzymes negative. Creatinine 2.5 . Treated for atypical chest pain 06/28/12 ECHO EF 25-30%  He is here for routine follow up today. Denies SOB/PND/Orthopnea. Weight 141-146 pounds. Denies lower extremity edema. Compliant with medications but his brother is concerned about cost of Diovan. Wears compression socks.        ROS: All systems negative except as listed in HPI, PMH and Problem List.  Past Medical History  Diagnosis Date  . Cardiomyopathy     Nonischemic, LVEF 20%  . Left bundle branch block   . Chronic renal insufficiency   . Hypothyroidism   . Scoliosis   . Mixed hyperlipidemia     Current  Outpatient Prescriptions  Medication Sig Dispense Refill  . aspirin 81 MG tablet Take 81 mg by mouth daily.        . carvedilol (COREG) 6.25 MG tablet TAKE ONE TABLET BY MOUTH TWICE DAILY WITH MEALS  60 tablet  11  . cetirizine (ZYRTEC) 10 MG tablet Take 10 mg by mouth daily. Equate Allergy Relief      . DIOVAN 40 MG tablet TAKE ONE TABLET BY MOUTH TWICE DAILY  60 each  9  . furosemide (LASIX) 40 MG tablet Take 1 tablet (40 mg total) by mouth 2 (two) times daily.  60 tablet  6  . hydrALAZINE (APRESOLINE) 25 MG tablet TAKE ONE TABLET BY MOUTH THREE TIMES DAILY  90 tablet  1  . isosorbide mononitrate (IMDUR) 30 MG 24 hr tablet TAKE ONE TABLET BY MOUTH EVERY DAY  30 tablet  8  . levothyroxine (SYNTHROID, LEVOTHROID) 150 MCG tablet Take 150 mcg by mouth daily.       [name] PACERONE 200 MG tablet TAKE ONE TABLET BY MOUTH EVERY DAY  30 each  6  . spironolactone (ALDACTONE) 25 MG tablet TAKE ONE TABLET BY MOUTH EVERY DAY  30 tablet  8  . warfarin (COUMADIN) 3 MG tablet Take 1.5-3 mg by mouth daily. Take as directed by Anticoagulation clinic. Last visit on 01/20/12. Current doses: Take 1.5mg  (0.5 tablet) on Sunday, Monday, Wednesday, and Friday. Take 3 mg (1 tablet) on Tuesday, Thursday, and Saturday.        [name]  warfarin (COUMADIN) 3 MG tablet TAKE AS DIRECTED BY ANTICOAGULATION CLINIC  30 tablet  3     PHYSICAL EXAM: Filed Vitals:   06/28/12 1104  BP: 102/52  Pulse: 50  Weight: 154 lb 4 oz (69.967 kg)  SpO2: 98%    General:  Well appearing. No resp difficulty. Walks around clinic without difficulty. Brother present Kyphotic HEENT: normal Neck: supple. JVP flat 5-6. Carotids 2+ bilaterally; no bruits. No lymphadenopathy or thryomegaly appreciated. Cor: PMI laterally displace. Rregular No rubs, gallops or murmurs.  Lungs: Clear Abdomen: soft, nontender, nondistended. No hepatosplenomegaly. No bruits or masses. Good bowel sounds. Extremities: no cyanosis, clubbing, rash. No edema. Stockings on.    Neuro: alert & orientedx3, cranial nerves grossly intact. Moves all 4 extremities w/o difficulty. Affect pleasant.    ASSESSMENT & PLAN:

## 2012-06-28 NOTE — Assessment & Plan Note (Signed)
Maintaining NSR. Continue amio and coumadin. Check LFTs/TFTs/CXR for amio surveillance at next visit.

## 2012-07-06 DIAGNOSIS — R55 Syncope and collapse: Secondary | ICD-10-CM

## 2012-07-07 DIAGNOSIS — R55 Syncope and collapse: Secondary | ICD-10-CM

## 2012-07-27 ENCOUNTER — Encounter (HOSPITAL_COMMUNITY): Payer: No Typology Code available for payment source

## 2012-08-03 ENCOUNTER — Ambulatory Visit (HOSPITAL_COMMUNITY)
Admission: RE | Admit: 2012-08-03 | Discharge: 2012-08-03 | Disposition: A | Discharge status: Home / Self Care | Payer: No Typology Code available for payment source | Source: Ambulatory Visit | Attending: Internal Medicine | Admitting: Internal Medicine

## 2012-08-03 VITALS — BP 110/56 | HR 52 | Wt 152.8 lb

## 2012-08-03 DIAGNOSIS — I4892 Unspecified atrial flutter: Secondary | ICD-10-CM | POA: Insufficient documentation

## 2012-08-03 DIAGNOSIS — I5022 Chronic systolic (congestive) heart failure: Secondary | ICD-10-CM | POA: Insufficient documentation

## 2012-08-03 LAB — BASIC METABOLIC PANEL
BUN: 35 mg/dL — ABNORMAL HIGH (ref 6–23)
CO2: 31 mEq/L (ref 19–32)
Chloride: 96 mEq/L (ref 96–112)
Creatinine, Ser: 2.28 mg/dL — ABNORMAL HIGH (ref 0.50–1.35)
Glucose, Bld: 85 mg/dL (ref 70–99)
Potassium: 4.3 mEq/L (ref 3.5–5.1)

## 2012-08-03 MED ORDER — FUROSEMIDE 40 MG PO TABS: 40.0000 mg | ORAL_TABLET | Freq: Every day | ORAL | Status: DC

## 2012-08-03 NOTE — Addendum Note (Signed)
Encounter addended by: Scarlette Calico, RN on: 08/03/2012 11:57 AM<BR>     Documentation filed: Patient Instructions Section, Orders

## 2012-08-03 NOTE — Assessment & Plan Note (Signed)
Maintaining SR on low dose amio. Will continue. Doubt syncope was related to his bradycardia. Continue coumadin.

## 2012-08-03 NOTE — Patient Instructions (Addendum)
Decrease Furosemide to 1 tab daily, if wt increases to 146 lb or greater take an extra tab  Your physician recommends that you schedule a follow-up appointment in:

## 2012-08-03 NOTE — Assessment & Plan Note (Signed)
Doing very well. Seems to be slightly orthostatic at times. Will cut lasix back to 40 mg daily with instructions to take an extra 40 mg on days that his weight is greater than 146.  We will continue to follow him closely.

## 2012-08-03 NOTE — Progress Notes (Signed)
Patient ID: [name], male   DOB: [birth year], 76 y.o.   MRN: [id number]  ADVANCED HEART FAILURE NOTE HPI:  [name] is a 76 y/o male with h/o cognitive impairment, renal insufficiency (baseline about 1.5-1.6), severe CHF due to NICM (EF 15-20%), atrial flutter (s/p DC-CV in April 2012) and LBBB. He and his family not interested in ICD.    12/2010: Coronary angiograms showed insignifcant CAD. Right heart cath (on milrinone) showed a right atrial pressure of about 12, PA pressure was 45/79 with a mean of 27,pulmonary capillary wedge pressure was 12, LV pressure was 85/5 with an EDP of about 8.  Fick cardiac output was 4.4 with a cardiac index of 2.3.  Pulmonary artery saturations were 61 and 63%.  Pulmonary vascular resistance was 3.1 Woods units.   12/2010: TEE-guided cardioversion that showed an EF of about 15-20%.  There was no evidence of left atrial appendage thrombus.  He was successfully cardioverted with 1 shock.  He was started on amiodarone to help maintain sinus rhythm.Given his LBBB we discussed the possibility of BiV-ICD but decided to defer as he was doing so well.  02/2011: ECHO with EF 123XX123, Grade 1 diastolic dysfunction, ventricular septum dyssynergy.  Mildly reduced RV systolic function.    01/24/12 Evaluated in the ED for chest pain and visual difficulty. Cardiac enzymes negative. Creatinine 2.5 . 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 Had some bleeding associated with it. Was in the doctor's office for f/u and had pre-syncopal episode while bending forward. Admitted to [facility]. Was orthostatic and bradycardic. Lasix apparently cut back to 40 daily (from bid) and carvedilol decreased to 3.125 bid (from 6.25 bid).  Still taking lasix 40 bid. Weight stable at home 142-146. Feels good> denies CP or SOB. No edema. If gets up too quickly does  gets dizzy. No further presyncope. Brother now giving him 1/2 glass pedia-lyte to prevent dehydration.         ROS: All systems negative except as listed in HPI, PMH and Problem List.  Past Medical History  Diagnosis Date  . Cardiomyopathy     Nonischemic, LVEF 20%  . Left bundle branch block   . Chronic renal insufficiency   . Hypothyroidism   . Scoliosis   . Mixed hyperlipidemia     Current Outpatient Prescriptions  Medication Sig Dispense Refill  . aspirin 81 MG tablet Take 81 mg by mouth daily.        . carvedilol (COREG) 6.25 MG tablet TAKE ONE TABLET BY MOUTH TWICE DAILY WITH MEALS  60 tablet  11  . cetirizine (ZYRTEC) 10 MG tablet Take 10 mg by mouth daily. Equate Allergy Relief      . furosemide (LASIX) 40 MG tablet Take 1 tablet (40 mg total) by mouth 2 (two) times daily.  60 tablet  6  . hydrALAZINE (APRESOLINE) 25 MG tablet Take 1/2 half tab tid      . isosorbide mononitrate (IMDUR) 30 MG 24 hr tablet TAKE ONE TABLET BY MOUTH EVERY DAY  30 tablet  8  . levothyroxine (SYNTHROID, LEVOTHROID) 150 MCG tablet Take 150 mcg by mouth daily.       [name] PACERONE 200 MG tablet TAKE ONE TABLET BY MOUTH EVERY DAY  30 each  6  . spironolactone (ALDACTONE) 25 MG tablet TAKE ONE TABLET BY MOUTH EVERY DAY  30 tablet  8  . valsartan (DIOVAN) 40 MG tablet Take 40 mg by mouth 2 (two) times daily.      [name] warfarin (COUMADIN) 3 MG tablet Take 1.5-3 mg by mouth daily. Take as directed by Anticoagulation clinic. Last visit on 01/20/12. Current doses: Take 1.5mg  (0.5 tablet) on Sunday, Monday, Wednesday, and Friday. Take 3 mg (1 tablet) on Tuesday, Thursday, and Saturday.        . losartan (COZAAR) 50 MG tablet Take 1 tablet (50 mg total) by mouth daily.  30 tablet  6     PHYSICAL EXAM: Filed Vitals:   08/03/12 1118  BP: 110/56  Pulse: 52  Weight: 152 lb 12.8 oz (69.31 kg)  SpO2: 95%    Orthostatics sitting 110/56 standing 106/56  General:  Well appearing. No resp difficulty. Walks  around clinic without difficulty. Brother present Kyphotic HEENT: normal Neck: supple. JVP flat. Carotids 2+ bilaterally; no bruits. No lymphadenopathy or thryomegaly appreciated. Cor: PMI laterally displace. Rregular No rubs, gallops or murmurs.  Lungs: Clear Abdomen: soft, nontender, nondistended. No hepatosplenomegaly. No bruits or masses. Good bowel sounds. Extremities: no cyanosis, clubbing, rash. No edema. Stockings on.  Neuro: alert & orientedx3, cranial nerves grossly intact. Moves all 4 extremities w/o difficulty. Affect pleasant.    ASSESSMENT & PLAN:

## 2012-08-04 ENCOUNTER — Other Ambulatory Visit: Payer: Self-pay | Admitting: Internal Medicine

## 2012-08-23 ENCOUNTER — Emergency Department (HOSPITAL_COMMUNITY)
Admission: EM | Admit: 2012-08-23 | Discharge: 2012-08-23 | Disposition: A | Discharge status: Home / Self Care | Payer: No Typology Code available for payment source | Attending: Emergency Medicine | Admitting: Emergency Medicine

## 2012-08-23 ENCOUNTER — Encounter (HOSPITAL_COMMUNITY): Payer: Self-pay | Admitting: Emergency Medicine

## 2012-08-23 ENCOUNTER — Emergency Department (HOSPITAL_COMMUNITY): Payer: No Typology Code available for payment source

## 2012-08-23 DIAGNOSIS — Z7982 Long term (current) use of aspirin: Secondary | ICD-10-CM | POA: Insufficient documentation

## 2012-08-23 DIAGNOSIS — Z79899 Other long term (current) drug therapy: Secondary | ICD-10-CM | POA: Insufficient documentation

## 2012-08-23 DIAGNOSIS — R05 Cough: Secondary | ICD-10-CM | POA: Insufficient documentation

## 2012-08-23 DIAGNOSIS — E782 Mixed hyperlipidemia: Secondary | ICD-10-CM | POA: Insufficient documentation

## 2012-08-23 DIAGNOSIS — N189 Chronic kidney disease, unspecified: Secondary | ICD-10-CM | POA: Insufficient documentation

## 2012-08-23 DIAGNOSIS — I447 Left bundle-branch block, unspecified: Secondary | ICD-10-CM | POA: Insufficient documentation

## 2012-08-23 DIAGNOSIS — R5381 Other malaise: Secondary | ICD-10-CM | POA: Insufficient documentation

## 2012-08-23 DIAGNOSIS — M4120 Other idiopathic scoliosis, site unspecified: Secondary | ICD-10-CM | POA: Insufficient documentation

## 2012-08-23 DIAGNOSIS — R5383 Other fatigue: Secondary | ICD-10-CM | POA: Insufficient documentation

## 2012-08-23 DIAGNOSIS — R059 Cough, unspecified: Secondary | ICD-10-CM | POA: Insufficient documentation

## 2012-08-23 DIAGNOSIS — R531 Weakness: Secondary | ICD-10-CM

## 2012-08-23 DIAGNOSIS — E039 Hypothyroidism, unspecified: Secondary | ICD-10-CM | POA: Insufficient documentation

## 2012-08-23 DIAGNOSIS — Z7901 Long term (current) use of anticoagulants: Secondary | ICD-10-CM | POA: Insufficient documentation

## 2012-08-23 LAB — CBC WITH DIFFERENTIAL/PLATELET
Eosinophils Absolute: 0.1 10*3/uL (ref 0.0–0.7)
Eosinophils Relative: 2 % (ref 0–5)
HCT: 40.5 % (ref 39.0–52.0)
Hemoglobin: 13.8 g/dL (ref 13.0–17.0)
Lymphs Abs: 1 10*3/uL (ref 0.7–4.0)
MCH: 32.6 pg (ref 26.0–34.0)
MCV: 95.7 fL (ref 78.0–100.0)
Monocytes Absolute: 0.5 10*3/uL (ref 0.1–1.0)
Monocytes Relative: 9 % (ref 3–12)
Neutrophils Relative %: 71 % (ref 43–77)
RBC: 4.23 MIL/uL (ref 4.22–5.81)

## 2012-08-23 LAB — BASIC METABOLIC PANEL
BUN: 30 mg/dL — ABNORMAL HIGH (ref 6–23)
GFR calc non Af Amer: 30 mL/min — ABNORMAL LOW (ref >90)
Glucose, Bld: 121 mg/dL — ABNORMAL HIGH (ref 70–99)
Potassium: 4.1 mEq/L (ref 3.5–5.1)

## 2012-08-23 MED ORDER — SODIUM CHLORIDE 0.9 % IV SOLN: INTRAVENOUS | Status: DC

## 2012-08-23 MED ORDER — SODIUM CHLORIDE 0.9 % IV BOLUS (SEPSIS)
1000.0000 mL | Freq: Once | INTRAVENOUS | Status: AC
  Administered 2012-08-23: 1000 mL via INTRAVENOUS

## 2012-08-23 NOTE — ED Notes (Signed)
Pt presents EMS with a cough, congestion, and weakness. Pts family state that he has had a cough since Friday that has gotten progressively worse over the last few days. Also state he has been weak. No other complaints at this time

## 2012-08-23 NOTE — ED Provider Notes (Signed)
History  This chart was scribed for Nat Christen, MD by Jenne Campus, ED Scribe. This patient was seen in room APA01/APA01 and the patient's care was started at 9:01 PM.  CSN: [id number]  Arrival date & time 08/23/12  2050   First MD Initiated Contact with Patient 08/23/12 2101      Chief Complaint  Patient presents with  . Cough  . Weakness    The history is provided by the patient. No language interpreter was used.   [name] is a 76 y.o. male brought in by ambulance, who presents to the Emergency Department complaining of 4 days of gradual onset, gradually worsening, constant non-productive cough with associated congestion, dizziness and weakness. He states that he needed to brace himself while walking down the hall tonight due to the dizziness which is unusual for him. [name] brother states that the pt is at his normal baseline. Pt denies fever, neck pain, sore throat, CP, SOB, nausea, emesis, and diarrhea as associated symptoms. He has a h/o cardiomyopathy and chronic renal insufficiency. He denies smoking and alcohol use.  PCP is Orthoptist.  Past Medical History  Diagnosis Date  . Cardiomyopathy     Nonischemic, LVEF 20%  . Left bundle branch block   . Chronic renal insufficiency   . Hypothyroidism   . Scoliosis   . Mixed hyperlipidemia     Past Surgical History  Procedure Date  . Laparoscopic cholecystectomy 2008  . Right inguinal herniorrhaphy     Family History  Problem Relation Age of Onset  . Heart disease Father     Died age 36  . Stroke Mother     Died age 27    History  Substance Use Topics  . Smoking status: Never Smoker   . Smokeless tobacco: Never Used  . Alcohol Use: No      Review of Systems  A complete 10 system review of systems was obtained and all systems are negative except as noted in the HPI and PMH.    Allergies  Review of patient's allergies indicates no known allergies.  Home Medications   Current Outpatient Rx  Name   Route  Sig  Dispense  Refill  . ASPIRIN 81 MG PO TABS   Oral   Take 81 mg by mouth daily.           [name] CARVEDILOL 6.25 MG PO TABS      TAKE ONE TABLET BY MOUTH TWICE DAILY WITH MEALS   60 tablet   11   . CETIRIZINE HCL 10 MG PO TABS   Oral   Take 10 mg by mouth daily. Equate Allergy Relief         . FUROSEMIDE 40 MG PO TABS   Oral   Take 1 tablet (40 mg total) by mouth daily. Take an extra tab if wt 146 or greater   60 tablet   6   . HYDRALAZINE HCL 25 MG PO TABS      Take 1/2 half tab tid         . HYDRALAZINE HCL 25 MG PO TABS   Oral   Take 0.5 tablets (12.5 mg total) by mouth 3 (three) times daily.   45 tablet   6   . ISOSORBIDE MONONITRATE ER 30 MG PO TB24      TAKE ONE TABLET BY MOUTH EVERY DAY   30 tablet   8   . LEVOTHYROXINE SODIUM 150 MCG PO TABS   Oral  Take 150 mcg by mouth daily.          [name] LOSARTAN POTASSIUM 50 MG PO TABS   Oral   Take 1 tablet (50 mg total) by mouth daily.   30 tablet   6   . PACERONE 200 MG PO TABS      TAKE ONE TABLET BY MOUTH EVERY DAY   30 each   6   . SPIRONOLACTONE 25 MG PO TABS      TAKE ONE TABLET BY MOUTH EVERY DAY   30 tablet   8   . VALSARTAN 40 MG PO TABS   Oral   Take 40 mg by mouth 2 (two) times daily.         . WARFARIN SODIUM 3 MG PO TABS   Oral   Take 1.5-3 mg by mouth daily. Take as directed by Anticoagulation clinic. Last visit on 01/20/12. Current doses: Take 1.5mg  (0.5 tablet) on Sunday, Monday, Wednesday, and Friday. Take 3 mg (1 tablet) on Tuesday, Thursday, and Saturday.             Triage Vitals: BP 129/45  Pulse 55  Temp 98.5 F (36.9 C) (Oral)  Resp 17  Ht 5\' 9"  (1.753 m)  Wt 152 lb (68.947 kg)  BMI 22.45 kg/m2  SpO2 97%  Physical Exam  Nursing note and vitals reviewed. Constitutional: He is oriented to person, place, and time. He appears well-developed and well-nourished.  HENT:  Head: Normocephalic and atraumatic.       Slightly dry mucus membranes  Eyes:  Conjunctivae normal and EOM are normal. Pupils are equal, round, and reactive to light.  Neck: Normal range of motion. Neck supple.  Cardiovascular: Regular rhythm.  Bradycardia present.        HR 51 which is baseline for pt according to his PCP  Pulmonary/Chest: Effort normal and breath sounds normal.  Abdominal: Soft. Bowel sounds are normal.  Musculoskeletal: Normal range of motion.       Kyphotic back   Neurological: He is alert and oriented to person, place, and time.  Skin: Skin is warm and dry.  Psychiatric: He has a normal mood and affect.    ED Course  Procedures (including critical care time)  DIAGNOSTIC STUDIES: Oxygen Saturation is 97% on room air, adequate by my interpretation.    COORDINATION OF CARE: 9:20 PM- Discussed treatment plan which includes IV fluids, blood work and CXR with pt at bedside and pt agreed to plan.  9:30 PM- Ordered 1,000 mL of Bolus  Labs Reviewed  BASIC METABOLIC PANEL - Abnormal; Notable for the following:    Glucose, Bld 121 (*)     BUN 30 (*)     Creatinine, Ser 2.05 (*)     GFR calc non Af Amer 30 (*)     GFR calc Af Amer 35 (*)     All other components within normal limits  CBC WITH DIFFERENTIAL  TROPONIN I  URINALYSIS, ROUTINE W REFLEX MICROSCOPIC   Dg Chest 2 View  08/23/2012  *RADIOLOGY REPORT*  Clinical Data: Cough.  CHEST - 2 VIEW  Comparison: 07/05/2012  Findings: Prominent thoracic kyphosis with anterior wedging of mid thoracic vertebra and degenerative changes.  Possible atelectasis in the right lung base.  Normal heart size and pulmonary vascularity.  No focal consolidation.  No blunting of costophrenic angles.  No pneumothorax.  Mediastinal contours appear intact.  IMPRESSION: Linear atelectasis in the right lung base.  No evidence of focal consolidation.  Original Report Authenticated By: Lucienne Capers, M.D.      No diagnosis found.   Date: 08/23/2012  Rate: 49  Rhythm: sinus bradycardia  QRS Axis: normal   Intervals: normal  ST/T Wave abnormalities: normal  Conduction Disutrbances:left bundle branch block  Narrative Interpretation:   Old EKG Reviewed: unchanged   MDM  Patient normally has pulse in the 50 range.  He is nontoxic appearing. Screening tests not flagrantly abnormal.  Will followup with primary care Dr. this week    I personally performed the services described in this documentation, which was scribed in my presence. The recorded information has been reviewed and is accurate.    Nat Christen, MD 08/23/12 [phone]

## 2012-08-23 NOTE — ED Notes (Signed)
Pt discharged. Pt stable at time of discharge. pt has no questions regarding discharge at this time. Pt voiced understanding of discharge instructions.

## 2012-08-30 ENCOUNTER — Other Ambulatory Visit: Payer: Self-pay | Admitting: Internal Medicine

## 2012-09-07 ENCOUNTER — Other Ambulatory Visit: Payer: Self-pay | Admitting: Internal Medicine

## 2012-10-04 ENCOUNTER — Ambulatory Visit (HOSPITAL_COMMUNITY): Payer: No Typology Code available for payment source

## 2012-10-20 ENCOUNTER — Ambulatory Visit (HOSPITAL_COMMUNITY)
Admission: RE | Admit: 2012-10-20 | Discharge: 2012-10-20 | Disposition: A | Discharge status: Home / Self Care | Payer: No Typology Code available for payment source | Source: Ambulatory Visit | Attending: Internal Medicine | Admitting: Internal Medicine

## 2012-10-20 VITALS — BP 112/50 | HR 54 | Wt 155.8 lb

## 2012-10-20 DIAGNOSIS — I5022 Chronic systolic (congestive) heart failure: Secondary | ICD-10-CM

## 2012-10-20 DIAGNOSIS — I429 Cardiomyopathy, unspecified: Secondary | ICD-10-CM | POA: Insufficient documentation

## 2012-10-20 DIAGNOSIS — I4892 Unspecified atrial flutter: Secondary | ICD-10-CM

## 2012-10-20 LAB — BASIC METABOLIC PANEL
BUN: 34 mg/dL — ABNORMAL HIGH (ref 6–23)
GFR calc non Af Amer: 31 mL/min — ABNORMAL LOW (ref >90)
Glucose, Bld: 84 mg/dL (ref 70–99)
Potassium: 4.9 mEq/L (ref 3.5–5.1)

## 2012-10-20 MED ORDER — APIXABAN 5 MG PO TABS: 5.0000 mg | ORAL_TABLET | Freq: Two times a day (BID) | ORAL | Status: DC

## 2012-10-20 MED ORDER — HYDRALAZINE HCL 25 MG PO TABS: 25.0000 mg | ORAL_TABLET | Freq: Three times a day (TID) | ORAL | Status: DC

## 2012-10-20 MED ORDER — CARVEDILOL 3.125 MG PO TABS: 3.1250 mg | ORAL_TABLET | Freq: Two times a day (BID) | ORAL | Status: DC

## 2012-10-20 MED ORDER — AMIODARONE HCL 200 MG PO TABS: 200.0000 mg | ORAL_TABLET | Freq: Every day | ORAL | Status: DC

## 2012-10-20 MED ORDER — FUROSEMIDE 40 MG PO TABS: 40.0000 mg | ORAL_TABLET | Freq: Every morning | ORAL | Status: DC

## 2012-10-20 NOTE — Progress Notes (Signed)
Patient ID: ECHO TOPP, male   DOB: Jul 03, 1936, 77 y.o.   MRN: [id number]  ADVANCED HEART FAILURE NOTE PCP: Dr Willey Blade  HPI: [name] is a 77 y/o male with h/o cognitive impairment, renal insufficiency (baseline about 1.5-1.6), severe CHF due to NICM (EF 15-20%), atrial flutter (s/p DC-CV in April 2012) and LBBB. He and his family not interested in ICD.    12/2010: Coronary angiograms showed insignifcant CAD. Right heart cath (on milrinone) showed a right atrial pressure of about 12, PA pressure was 45/79 with a mean of 27,pulmonary capillary wedge pressure was 12, LV pressure was 85/5 with an EDP of about 8.  Fick cardiac output was 4.4 with a cardiac index of 2.3.  Pulmonary artery saturations were 61 and 63%.  Pulmonary vascular resistance was 3.1 Woods units.   12/2010: TEE-guided cardioversion that showed an EF of about 15-20%.  There was no evidence of left atrial appendage thrombus.  He was successfully cardioverted with 1 shock.  He was started on amiodarone to help maintain sinus rhythm.Given his LBBB we discussed the possibility of BiV-ICD but decided to defer as he was doing so well.  02/2011: ECHO with EF 123XX123, Grade 1 diastolic dysfunction, ventricular septum dyssynergy.  Mildly reduced RV systolic function.    01/24/12 Evaluated in the ED for chest pain and visual difficulty. Cardiac enzymes negative. Creatinine 2.5 . Treated for atypical chest pain  06/28/12 ECHO EF 25-30%   He returns for follow up with his brother. Last visit Lasix was cut back to 40 mg daily with instructions to take an additional 40 mg of Lasix if his weight is 146 pounds or greater. Denies SOB/PND/Orthopnea/dizziness. No problems with bleeding.  Weight at home 143-144 pounds. He has not required any extra lasix.  Brother prepares his pill box.     ROS: All systems negative except as listed in HPI, PMH and Problem List.  Past Medical History  Diagnosis Date  . Cardiomyopathy     Nonischemic, LVEF 20%  .  Left bundle branch block   . Chronic renal insufficiency   . Hypothyroidism   . Scoliosis   . Mixed hyperlipidemia     Current Outpatient Prescriptions  Medication Sig Dispense Refill  . amiodarone (PACERONE) 200 MG tablet Take 1 tablet (200 mg total) by mouth daily.  30 tablet  2  . aspirin 81 MG tablet Take 81 mg by mouth every morning.       . cetirizine (ZYRTEC) 10 MG tablet Take 10 mg by mouth every morning. Equate Allergy Relief      . furosemide (LASIX) 40 MG tablet Take 1 tablet (40 mg total) by mouth every morning. Take an extra tab if wt 146 or greater  40 tablet  6  . hydrALAZINE (APRESOLINE) 25 MG tablet Take 1 tablet (25 mg total) by mouth 3 (three) times daily.  90 tablet  6  . isosorbide mononitrate (IMDUR) 30 MG 24 hr tablet Take 30 mg by mouth every morning.      [name] levothyroxine (SYNTHROID, LEVOTHROID) 150 MCG tablet Take 150 mcg by mouth every morning.       [name] losartan (COZAAR) 50 MG tablet Take 1 tablet (50 mg total) by mouth daily.  30 tablet  6  . spironolactone (ALDACTONE) 25 MG tablet TAKE ONE TABLET BY MOUTH EVERY DAY  30 tablet  7  . warfarin (COUMADIN) 3 MG tablet Take 1.5-3 mg by mouth daily. Take as directed by Anticoagulation clinic. Last visit on  01/20/12. Current doses: Take 1.5mg  (0.5 tablet) on Sunday, Monday, Wednesday, and Friday. Take 3 mg (1 tablet) on Tuesday, Thursday, and Saturday.        . carvedilol (COREG) 3.125 MG tablet Take 1 tablet (3.125 mg total) by mouth 2 (two) times daily with a meal.  60 tablet  3  . warfarin (COUMADIN) 3 MG tablet TAKE AS DIRECTED BY ANTICOAGULATION CLINIC  30 tablet  2     PHYSICAL EXAM: Filed Vitals:   10/20/12 1146  BP: 112/50  Pulse: 54  Weight: 155 lb 12.8 oz (70.67 kg)  SpO2: 96%    Orthostatics sitting 110/56 standing 106/56  General:  Well appearing. No resp difficulty. Walks around clinic without difficulty. Brother present Kyphotic HEENT: normal Neck: supple. JVP flat. Carotids 2+ bilaterally; no  bruits. No lymphadenopathy or thryomegaly appreciated. Cor: PMI laterally displace. Rregular No rubs, gallops or murmurs.  Lungs: Clear Abdomen: soft, nontender, nondistended. No hepatosplenomegaly. No bruits or masses. Good bowel sounds. Extremities: no cyanosis, clubbing, rash. No edema. Stockings on.  Neuro: alert & orientedx3, cranial nerves grossly intact. Moves all 4 extremities w/o difficulty. Affect pleasant.    ASSESSMENT & PLAN:

## 2012-10-20 NOTE — Assessment & Plan Note (Addendum)
NYHA II. Volume status stable. Continue current regimen. Check BMET today. Follow up in 3 months with Dr Haroldine Laws.   Patient seen and examined with Darrick Grinder, NP. We discussed all aspects of the encounter. I agree with the assessment and plan as stated above.  Doing very well. Continue current regimen. Unable to titrate meds due to previous hypotension and bradycardia. Does not want ICD.

## 2012-10-20 NOTE — Patient Instructions (Addendum)
Stop Coumadin Start Eliquis 5 mg Twice daily on Saturday   Follow up in 3 months  Do the following things EVERYDAY: 1) Weigh yourself in the morning before breakfast. Write it down and keep it in a log. 2) Take your medicines as prescribed 3) Eat low salt foods-Limit salt (sodium) to 2000 mg per day.  4) Stay as active as you can everyday 5) Limit all fluids for the day to less than 2 liters

## 2012-10-20 NOTE — Assessment & Plan Note (Addendum)
Check PT/INR. Plan to stop coumadin.  Will switch to Elliquis 5 mg twice a day.  Attending: Agree. Has not had INR checked in multiple months. NOAC likely safer option. Will start apixaban 5 bid. As he gets older will need to cut back to 2.5 bid.

## 2012-11-09 ENCOUNTER — Other Ambulatory Visit (HOSPITAL_COMMUNITY): Payer: Self-pay | Admitting: Adult Health

## 2012-12-10 ENCOUNTER — Ambulatory Visit: Payer: Self-pay | Admitting: Pharmacist

## 2012-12-10 DIAGNOSIS — I4892 Unspecified atrial flutter: Secondary | ICD-10-CM

## 2012-12-10 DIAGNOSIS — Z7901 Long term (current) use of anticoagulants: Secondary | ICD-10-CM

## 2013-01-25 ENCOUNTER — Other Ambulatory Visit (HOSPITAL_COMMUNITY): Payer: Self-pay | Admitting: Adult Health

## 2013-03-03 ENCOUNTER — Ambulatory Visit (HOSPITAL_COMMUNITY)
Admission: RE | Admit: 2013-03-03 | Discharge: 2013-03-03 | Disposition: A | Discharge status: Home / Self Care | Payer: No Typology Code available for payment source | Source: Ambulatory Visit | Attending: Internal Medicine | Admitting: Internal Medicine

## 2013-03-03 ENCOUNTER — Encounter (HOSPITAL_COMMUNITY): Payer: Self-pay

## 2013-03-03 VITALS — BP 100/50 | HR 55 | Wt 157.8 lb

## 2013-03-03 DIAGNOSIS — G3184 Mild cognitive impairment, so stated: Secondary | ICD-10-CM | POA: Insufficient documentation

## 2013-03-03 DIAGNOSIS — E782 Mixed hyperlipidemia: Secondary | ICD-10-CM | POA: Insufficient documentation

## 2013-03-03 DIAGNOSIS — I4891 Unspecified atrial fibrillation: Secondary | ICD-10-CM

## 2013-03-03 DIAGNOSIS — I5022 Chronic systolic (congestive) heart failure: Secondary | ICD-10-CM

## 2013-03-03 DIAGNOSIS — I509 Heart failure, unspecified: Secondary | ICD-10-CM | POA: Insufficient documentation

## 2013-03-03 DIAGNOSIS — Z79899 Other long term (current) drug therapy: Secondary | ICD-10-CM | POA: Insufficient documentation

## 2013-03-03 DIAGNOSIS — E039 Hypothyroidism, unspecified: Secondary | ICD-10-CM | POA: Insufficient documentation

## 2013-03-03 DIAGNOSIS — I428 Other cardiomyopathies: Secondary | ICD-10-CM | POA: Insufficient documentation

## 2013-03-03 DIAGNOSIS — M4120 Other idiopathic scoliosis, site unspecified: Secondary | ICD-10-CM | POA: Insufficient documentation

## 2013-03-03 DIAGNOSIS — Z7982 Long term (current) use of aspirin: Secondary | ICD-10-CM | POA: Insufficient documentation

## 2013-03-03 DIAGNOSIS — I447 Left bundle-branch block, unspecified: Secondary | ICD-10-CM | POA: Insufficient documentation

## 2013-03-03 DIAGNOSIS — I2510 Atherosclerotic heart disease of native coronary artery without angina pectoris: Secondary | ICD-10-CM | POA: Insufficient documentation

## 2013-03-03 DIAGNOSIS — N183 Chronic kidney disease, stage 3 unspecified: Secondary | ICD-10-CM | POA: Insufficient documentation

## 2013-03-03 MED ORDER — CARVEDILOL 3.125 MG PO TABS: 3.1250 mg | ORAL_TABLET | Freq: Two times a day (BID) | ORAL | Status: DC

## 2013-03-03 MED ORDER — FUROSEMIDE 40 MG PO TABS: 40.0000 mg | ORAL_TABLET | Freq: Every morning | ORAL | Status: DC

## 2013-03-03 MED ORDER — ISOSORBIDE MONONITRATE ER 30 MG PO TB24: 30.0000 mg | ORAL_TABLET | Freq: Every morning | ORAL | Status: DC

## 2013-03-03 MED ORDER — SPIRONOLACTONE 25 MG PO TABS: 25.0000 mg | ORAL_TABLET | Freq: Every day | ORAL | Status: DC

## 2013-03-03 MED ORDER — HYDRALAZINE HCL 25 MG PO TABS: 25.0000 mg | ORAL_TABLET | Freq: Three times a day (TID) | ORAL | Status: DC

## 2013-03-03 MED ORDER — LOSARTAN POTASSIUM 50 MG PO TABS: 50.0000 mg | ORAL_TABLET | Freq: Every day | ORAL | Status: DC

## 2013-03-03 NOTE — Progress Notes (Signed)
Patient ID: [name], male   DOB: 05-Nov-1935, 77 y.o.   MRN: [id number]  ADVANCED HEART FAILURE NOTE PCP: Dr Willey Blade  HPI: [name] is a 77 y/o male with h/o cognitive impairment, renal insufficiency (baseline about 1.5-1.6), severe CHF due to NICM (EF 15-20%), atrial flutter (s/p DC-CV in April 2012) and LBBB. He and his family not interested in ICD.   12/2010: Coronary angiograms showed insignifcant CAD. Right heart cath (on milrinone) showed a right atrial pressure of about 12, PA pressure was 45/79 with a mean of 27,pulmonary capillary wedge pressure was 12, LV pressure was 85/5 with an EDP of about 8.  Fick cardiac output was 4.4 with a cardiac index of 2.3.  Pulmonary artery saturations were 61 and 63%.  Pulmonary vascular resistance was 3.1 Woods units.   12/2010: TEE-guided cardioversion that showed an EF of about 15-20%.  There was no evidence of left atrial appendage thrombus.  He was successfully cardioverted with 1 shock.  He was started on amiodarone to help maintain sinus rhythm.Given his LBBB we discussed the possibility of BiV-ICD but decided to defer as he was doing so well.  02/2011: ECHO with EF 123XX123, Grade 1 diastolic dysfunction, ventricular septum dyssynergy.  Mildly reduced RV systolic function.    06/28/12 ECHO EF 25-30%  He returns for follow up with his brother. Denies SOB/PND/Orthopnea. Weight at home 143 pounds.  Brother prepares his pill box. Does not want ICD.    ROS: All systems negative except as listed in HPI, PMH and Problem List.  Past Medical History  Diagnosis Date  . Cardiomyopathy     Nonischemic, LVEF 20%  . Left bundle branch block   . Chronic renal insufficiency   . Hypothyroidism   . Scoliosis   . Mixed hyperlipidemia     Current Outpatient Prescriptions  Medication Sig Dispense Refill  . amiodarone (PACERONE) 200 MG tablet Take 1 tablet (200 mg total) by mouth daily.  30 tablet  2  . aspirin 81 MG tablet Take 81 mg by mouth every  morning.       . carvedilol (COREG) 3.125 MG tablet Take 1 tablet (3.125 mg total) by mouth 2 (two) times daily with a meal.  60 tablet  3  . cetirizine (ZYRTEC) 10 MG tablet Take 10 mg by mouth every morning. Equate Allergy Relief      . ELIQUIS 5 MG TABS tablet TAKE ONE TABLET BY MOUTH TWICE DAILY  60 tablet  3  . furosemide (LASIX) 40 MG tablet Take 1 tablet (40 mg total) by mouth every morning. Take an extra tab if wt 146 or greater  40 tablet  6  . hydrALAZINE (APRESOLINE) 25 MG tablet Take 1 tablet (25 mg total) by mouth 3 (three) times daily.  90 tablet  6  . isosorbide mononitrate (IMDUR) 30 MG 24 hr tablet Take 30 mg by mouth every morning.      [name] levothyroxine (SYNTHROID, LEVOTHROID) 150 MCG tablet Take 150 mcg by mouth every morning.       . loratadine (CLARITIN) 10 MG tablet Take 10 mg by mouth daily.      [name] losartan (COZAAR) 50 MG tablet TAKE ONE TABLET BY MOUTH EVERY DAY  30 tablet  6  . spironolactone (ALDACTONE) 25 MG tablet TAKE ONE TABLET BY MOUTH EVERY DAY  30 tablet  7   No current facility-administered medications for this encounter.     PHYSICAL EXAM: Filed Vitals:   03/03/13 0939  BP:  100/50  Pulse: 55  Weight: 157 lb 12.8 oz (71.578 kg)  SpO2: 98%    General:  Well appearing. No resp difficulty. Brother present Kyphotic HEENT: normal Neck: supple. JVP flat. Carotids 2+ bilaterally; no bruits. No lymphadenopathy or thryomegaly appreciated. Cor: PMI laterally displace. Rregular No rubs, gallops.  MR  Lungs: Clear Abdomen: soft, nontender, nondistended. No hepatosplenomegaly. No bruits or masses. Good bowel sounds. Extremities: no cyanosis, clubbing, rash. No edema. Stockings on.  Neuro: alert & orientedx3, cranial nerves grossly intact. Moves all 4 extremities w/o difficulty. Affect pleasant.    ASSESSMENT & PLAN:

## 2013-03-03 NOTE — Patient Instructions (Addendum)
   Follow up in 4 months  Do the following things EVERYDAY: 1) Weigh yourself in the morning before breakfast. Write it down and keep it in a log. 2) Take your medicines as prescribed 3) Eat low salt foods-Limit salt (sodium) to 2000 mg per day.  4) Stay as active as you can everyday 5) Limit all fluids for the day to less than 2 liters 

## 2013-03-03 NOTE — Assessment & Plan Note (Addendum)
NYHA II. Volume status stable. Continue current regimen. He declines ICD. Follow up in 4 months  Patient seen and examined with Darrick Grinder, NP. We discussed all aspects of the encounter. I agree with the assessment and plan as stated above.  He continue to do very well. Volume status looks good. Reviewed use of sliding scale diuretics with his family.

## 2013-03-09 ENCOUNTER — Other Ambulatory Visit: Payer: Self-pay

## 2013-03-09 MED ORDER — AMIODARONE HCL 200 MG PO TABS: 200.0000 mg | ORAL_TABLET | Freq: Every day | ORAL | Status: DC

## 2013-03-09 NOTE — Assessment & Plan Note (Signed)
Attending: Maintainingg SR on amio. Continue apixaban.

## 2013-05-30 ENCOUNTER — Encounter: Payer: Self-pay | Admitting: Internal Medicine

## 2013-07-27 ENCOUNTER — Ambulatory Visit (HOSPITAL_COMMUNITY)
Admission: RE | Admit: 2013-07-27 | Discharge: 2013-07-27 | Disposition: A | Discharge status: Home / Self Care | Payer: No Typology Code available for payment source | Source: Ambulatory Visit | Attending: Internal Medicine | Admitting: Internal Medicine

## 2013-07-27 ENCOUNTER — Encounter (HOSPITAL_COMMUNITY): Payer: Self-pay

## 2013-07-27 VITALS — BP 120/64 | HR 57 | Wt 162.8 lb

## 2013-07-27 DIAGNOSIS — I4892 Unspecified atrial flutter: Secondary | ICD-10-CM

## 2013-07-27 DIAGNOSIS — N183 Chronic kidney disease, stage 3 unspecified: Secondary | ICD-10-CM | POA: Insufficient documentation

## 2013-07-27 DIAGNOSIS — I428 Other cardiomyopathies: Secondary | ICD-10-CM | POA: Insufficient documentation

## 2013-07-27 DIAGNOSIS — I447 Left bundle-branch block, unspecified: Secondary | ICD-10-CM

## 2013-07-27 DIAGNOSIS — I5022 Chronic systolic (congestive) heart failure: Secondary | ICD-10-CM

## 2013-07-27 DIAGNOSIS — I509 Heart failure, unspecified: Secondary | ICD-10-CM | POA: Insufficient documentation

## 2013-07-27 DIAGNOSIS — E7800 Pure hypercholesterolemia, unspecified: Secondary | ICD-10-CM | POA: Insufficient documentation

## 2013-07-27 DIAGNOSIS — E039 Hypothyroidism, unspecified: Secondary | ICD-10-CM | POA: Insufficient documentation

## 2013-07-27 DIAGNOSIS — I498 Other specified cardiac arrhythmias: Secondary | ICD-10-CM | POA: Insufficient documentation

## 2013-07-27 MED ORDER — AMIODARONE HCL 200 MG PO TABS: 200.0000 mg | ORAL_TABLET | Freq: Every day | ORAL | Status: DC

## 2013-07-27 NOTE — Patient Instructions (Signed)
We will contact you in 6 months to schedule your next appointment.  

## 2013-07-27 NOTE — Addendum Note (Signed)
Encounter addended by: Scarlette Calico, RN on: 07/27/2013 11:35 AM<BR>     Documentation filed: Patient Instructions Section, Orders

## 2013-07-27 NOTE — Progress Notes (Signed)
Patient ID: [name], male   DOB: 1936/08/30, 77 y.o.   MRN: [id number]  ADVANCED HEART FAILURE NOTE PCP: Dr Willey Blade  HPI: [name] is a 77 y/o male with h/o cognitive impairment, renal insufficiency (baseline about 1.5-1.6), severe CHF due to NICM (EF 15-20%), atrial flutter (s/p DC-CV in April 2012) and LBBB. He and his family are not interested in ICD.   12/2010: Coronary angiograms showed insignifcant CAD. Right heart cath (on milrinone) showed a right atrial pressure of about 12, PA pressure was 45/79 with a mean of 27,pulmonary capillary wedge pressure was 12, LV pressure was 85/5 with an EDP of about 8.  Fick cardiac output was 4.4 with a cardiac index of 2.3.  Pulmonary artery saturations were 61 and 63%.  Pulmonary vascular resistance was 3.1 Woods units.   12/2010: TEE-guided cardioversion that showed an EF of about 15-20%.  There was no evidence of left atrial appendage thrombus.  He was successfully cardioverted with 1 shock.  He was started on amiodarone to help maintain sinus rhythm.Given his LBBB we discussed the possibility of BiV-ICD but decided to defer as he was doing so well.  02/2011: ECHO with EF 123XX123, Grade 1 diastolic dysfunction, ventricular septum dyssynergy.  Mildly reduced RV systolic function.    06/28/12 ECHO EF 25-30%  He returns for follow up with his brother. Doing very well. Remains active. Denies SOB/PND/Orthopnea/edema/palpitations. Weight at home very stable at 143 pounds.  Brother prepares his pill box. Continues on Eliquis. No bleeding.     ROS: All systems negative except as listed in HPI, PMH and Problem List.  Past Medical History  Diagnosis Date  . Cardiomyopathy     Nonischemic, LVEF 20%  . Left bundle branch block   . Chronic renal insufficiency   . Hypothyroidism   . Scoliosis   . Mixed hyperlipidemia     Current Outpatient Prescriptions  Medication Sig Dispense Refill  . amiodarone (PACERONE) 200 MG tablet Take 1 tablet (200 mg total)  by mouth daily.  30 tablet  5  . aspirin 81 MG tablet Take 81 mg by mouth every morning.       . carvedilol (COREG) 3.125 MG tablet Take 1 tablet (3.125 mg total) by mouth 2 (two) times daily with a meal.  60 tablet  12  . cetirizine (ZYRTEC) 10 MG tablet Take 10 mg by mouth every morning. Equate Allergy Relief      . ELIQUIS 5 MG TABS tablet TAKE ONE TABLET BY MOUTH TWICE DAILY  60 tablet  3  . furosemide (LASIX) 40 MG tablet Take 1 tablet (40 mg total) by mouth every morning. Take an extra tab if wt 146 or greater  40 tablet  12  . hydrALAZINE (APRESOLINE) 25 MG tablet Take 1 tablet (25 mg total) by mouth 3 (three) times daily.  90 tablet  12  . isosorbide mononitrate (IMDUR) 30 MG 24 hr tablet Take 1 tablet (30 mg total) by mouth every morning.  30 tablet  12  . levothyroxine (SYNTHROID, LEVOTHROID) 150 MCG tablet Take 150 mcg by mouth every morning.       . loratadine (CLARITIN) 10 MG tablet Take 10 mg by mouth daily.      [name] losartan (COZAAR) 50 MG tablet Take 1 tablet (50 mg total) by mouth daily.  30 tablet  12  . spironolactone (ALDACTONE) 25 MG tablet Take 1 tablet (25 mg total) by mouth daily.  30 tablet  12   No current  facility-administered medications for this encounter.     PHYSICAL EXAM: Filed Vitals:   07/27/13 1104  BP: 120/64  Pulse: 57  Weight: 162 lb 12.8 oz (73.846 kg)  SpO2: 97%    General:  Well appearing. No resp difficulty. Brother present Severe kyphosis HEENT: normal Neck: supple. JVP flat. Carotids 2+ bilaterally; no bruits. No lymphadenopathy or thryomegaly appreciated. Cor: PMI laterally displace. Regular No rubs, gallops.  MR  Lungs: Clear. Decreased throughout Abdomen: soft, nontender, nondistended. No hepatosplenomegaly. No bruits or masses. Good bowel sounds. Extremities: no cyanosis, clubbing, rash. No edema. Stockings on.  Neuro: alert & orientedx3, cranial nerves grossly intact. Moves all 4 extremities w/o difficulty. Affect  pleasant.    ASSESSMENT & PLAN:  1. Chronic systolic HF 2. AFL - maintaining SR on amio. On apixaban. Dr. Willey Blade following labs. Will ask him to get LFTs and TFSTs q6 months for amio f/u 3. CKD, stage 3 - stable 4. LBBB  He continues to do wonderfully. HF well compensated. On good meds. Remains in NSR on amio. On apixaban for [facility]. Not interested in ICD. Will continue current therapy. Unable to titrate b-block due to bradycardia  Benay Spice 11:32 AM

## 2013-11-25 ENCOUNTER — Encounter: Payer: Self-pay | Admitting: Internal Medicine

## 2014-01-17 ENCOUNTER — Emergency Department (HOSPITAL_COMMUNITY)
Admission: EM | Admit: 2014-01-17 | Discharge: 2014-01-17 | Disposition: A | Discharge status: Home / Self Care | Payer: Medicare HMO | Attending: Emergency Medicine | Admitting: Emergency Medicine

## 2014-01-17 ENCOUNTER — Emergency Department (HOSPITAL_COMMUNITY): Payer: Medicare HMO

## 2014-01-17 ENCOUNTER — Encounter (HOSPITAL_COMMUNITY): Payer: Self-pay | Admitting: Emergency Medicine

## 2014-01-17 DIAGNOSIS — Z79899 Other long term (current) drug therapy: Secondary | ICD-10-CM | POA: Insufficient documentation

## 2014-01-17 DIAGNOSIS — E039 Hypothyroidism, unspecified: Secondary | ICD-10-CM | POA: Insufficient documentation

## 2014-01-17 DIAGNOSIS — R55 Syncope and collapse: Secondary | ICD-10-CM

## 2014-01-17 DIAGNOSIS — Z8679 Personal history of other diseases of the circulatory system: Secondary | ICD-10-CM | POA: Insufficient documentation

## 2014-01-17 DIAGNOSIS — R6883 Chills (without fever): Secondary | ICD-10-CM | POA: Insufficient documentation

## 2014-01-17 DIAGNOSIS — M4000 Postural kyphosis, site unspecified: Secondary | ICD-10-CM | POA: Insufficient documentation

## 2014-01-17 DIAGNOSIS — Z7982 Long term (current) use of aspirin: Secondary | ICD-10-CM | POA: Insufficient documentation

## 2014-01-17 DIAGNOSIS — Z7901 Long term (current) use of anticoagulants: Secondary | ICD-10-CM | POA: Insufficient documentation

## 2014-01-17 DIAGNOSIS — Z8739 Personal history of other diseases of the musculoskeletal system and connective tissue: Secondary | ICD-10-CM | POA: Insufficient documentation

## 2014-01-17 DIAGNOSIS — R0602 Shortness of breath: Secondary | ICD-10-CM | POA: Insufficient documentation

## 2014-01-17 DIAGNOSIS — N189 Chronic kidney disease, unspecified: Secondary | ICD-10-CM | POA: Insufficient documentation

## 2014-01-17 LAB — TROPONIN I: Troponin I: <0.3 ng/mL (ref <0.30)

## 2014-01-17 LAB — COMPREHENSIVE METABOLIC PANEL
ALBUMIN: 3.2 g/dL — AB (ref 3.5–5.2)
ALK PHOS: 55 U/L (ref 39–117)
ALT: 14 U/L (ref 0–53)
AST: 22 U/L (ref 0–37)
BUN: 36 mg/dL — AB (ref 6–23)
CHLORIDE: 102 meq/L (ref 96–112)
CO2: 27 meq/L (ref 19–32)
CREATININE: 2.28 mg/dL — AB (ref 0.50–1.35)
Calcium: 8.2 mg/dL — ABNORMAL LOW (ref 8.4–10.5)
GFR calc Af Amer: 31 mL/min — ABNORMAL LOW (ref >90)
GFR calc non Af Amer: 26 mL/min — ABNORMAL LOW (ref >90)
GLUCOSE: 108 mg/dL — AB (ref 70–99)
POTASSIUM: 4.5 meq/L (ref 3.7–5.3)
Sodium: 138 mEq/L (ref 137–147)
Total Bilirubin: 0.4 mg/dL (ref 0.3–1.2)
Total Protein: 6.6 g/dL (ref 6.0–8.3)

## 2014-01-17 LAB — CBC WITH DIFFERENTIAL/PLATELET
Basophils Absolute: 0.1 10*3/uL (ref 0.0–0.1)
Basophils Relative: 1 % (ref 0–1)
Eosinophils Absolute: 0.1 10*3/uL (ref 0.0–0.7)
Eosinophils Relative: 1 % (ref 0–5)
HCT: 37.4 % — ABNORMAL LOW (ref 39.0–52.0)
HEMOGLOBIN: 12.5 g/dL — AB (ref 13.0–17.0)
LYMPHS ABS: 0.9 10*3/uL (ref 0.7–4.0)
LYMPHS PCT: 10 % — AB (ref 12–46)
MCH: 32.6 pg (ref 26.0–34.0)
MCHC: 33.4 g/dL (ref 30.0–36.0)
MCV: 97.7 fL (ref 78.0–100.0)
MONO ABS: 0.5 10*3/uL (ref 0.1–1.0)
MONOS PCT: 6 % (ref 3–12)
NEUTROS ABS: 8 10*3/uL — AB (ref 1.7–7.7)
NEUTROS PCT: 82 % — AB (ref 43–77)
Platelets: 154 10*3/uL (ref 150–400)
RBC: 3.83 MIL/uL — AB (ref 4.22–5.81)
RDW: 12.3 % (ref 11.5–15.5)
WBC: 9.6 10*3/uL (ref 4.0–10.5)

## 2014-01-17 LAB — I-STAT TROPONIN, ED: Troponin i, poc: 0.01 ng/mL (ref 0.00–0.08)

## 2014-01-17 LAB — PRO B NATRIURETIC PEPTIDE: Pro B Natriuretic peptide (BNP): 351.1 pg/mL (ref 0–450)

## 2014-01-17 NOTE — ED Provider Notes (Signed)
CSN: [id number]     Arrival date & time 01/17/14  1138 History  This chart was scribed for Joshua Norrie, MD by Eston Mould, ED Scribe. This patient was seen in room APA02/APA02 and the patient's care was started at 12:24 PM.   Chief Complaint  Patient presents with  . Near Syncope   The history is provided by the patient. No language interpreter was used.   HPI Comments: [name] is a 78 y.o. male brought in by EMS who presents to the Emergency Department complaining of near syncope episode that occurred today. Pt states his only pain is his arthritis pain. He states he informed his wife his "neck was hurting" prior to his [place] trip. He states he was ordering at the [facility] and reports "breaking out into a cold sweat". He states he was feeling off balanced and felt like he was going to pass out and was given a chair to sit down due to his complaint.  [name] He reports feeling SOB also during this episode while at [facility]. Cannot tell me how long he had been standing before he felt bad. Reports wearing compression stalking regularly for swelling and denies any abnormal swelling in his legs at this time. Reports SOB many years ago. Pt states he has been coughing lately but states "he has sinus problems". Denies hx of smoking. Denies CP, HA, nausea, emesis, fevers, SOB at this time. He states he started feeling better when they put oxygen on him.  He states "he feels better than he was feeling at [facility] at this time" but denies feeling normal.  Dr. Maeola Harman is his PCP Dr. Sung Amabile is his Cardiologist.  Past Medical History  Diagnosis Date  . Cardiomyopathy     Nonischemic, LVEF 20%  . Left bundle branch block   . Chronic renal insufficiency   . Hypothyroidism   . Scoliosis   . Mixed hyperlipidemia    Past Surgical History  Procedure Laterality Date  . Laparoscopic cholecystectomy  2008  . Right inguinal herniorrhaphy     Family History  Problem Relation Age of Onset  .  Heart disease Father     Died age 20  . Stroke Mother     Died age 70   History  Substance Use Topics  . Smoking status: Never Smoker   . Smokeless tobacco: Never Used  . Alcohol Use: No  lives at home Lives with spouse  Review of Systems  Constitutional: Positive for chills. Negative for fever.  Respiratory: Positive for shortness of breath.   Cardiovascular: Positive for near-syncope. Negative for chest pain.  Gastrointestinal: Negative for nausea and vomiting.  Neurological: Negative for headaches.   Allergies  Review of patient's allergies indicates no known allergies.  Home Medications   Prior to Admission medications   Medication Sig Start Date End Date Taking? Authorizing Provider  amiodarone (PACERONE) 200 MG tablet Take 1 tablet (200 mg total) by mouth daily. 07/27/13   Jolaine Artist, MD  aspirin 81 MG tablet Take 81 mg by mouth every morning.     Historical Provider, MD  carvedilol (COREG) 3.125 MG tablet Take 1 tablet (3.125 mg total) by mouth 2 (two) times daily with a meal. 03/03/13   Amy D Clegg, NP  cetirizine (ZYRTEC) 10 MG tablet Take 10 mg by mouth every morning. Equate Allergy Relief    Historical Provider, MD  ELIQUIS 5 MG TABS tablet TAKE ONE TABLET BY MOUTH TWICE DAILY 11/09/12   Amy D  Clegg, NP  furosemide (LASIX) 40 MG tablet Take 1 tablet (40 mg total) by mouth every morning. Take an extra tab if wt 146 or greater 03/03/13   Amy D Clegg, NP  hydrALAZINE (APRESOLINE) 25 MG tablet Take 1 tablet (25 mg total) by mouth 3 (three) times daily. 03/03/13   Amy D Ninfa Meeker, NP  isosorbide mononitrate (IMDUR) 30 MG 24 hr tablet Take 1 tablet (30 mg total) by mouth every morning. 03/03/13   Amy D Clegg, NP  levothyroxine (SYNTHROID, LEVOTHROID) 150 MCG tablet Take 150 mcg by mouth every morning.     Historical Provider, MD  loratadine (CLARITIN) 10 MG tablet Take 10 mg by mouth daily.    Historical Provider, MD  losartan (COZAAR) 50 MG tablet Take 1 tablet (50 mg total)  by mouth daily. 03/03/13   Amy D Ninfa Meeker, NP  spironolactone (ALDACTONE) 25 MG tablet Take 1 tablet (25 mg total) by mouth daily. 03/03/13   Amy D Clegg, NP   BP 108/50  Pulse 48  Temp(Src) 97.6 F (36.4 C) (Oral)  Resp 20  SpO2 97%  Vital signs normal except for bradycardia   Physical Exam  Nursing note and vitals reviewed. Constitutional: He is oriented to person, place, and time. He appears well-developed and well-nourished.  Non-toxic appearance. He does not appear ill. No distress.  He does have a speech impediment which makes it difficult to understand.  HENT:  Head: Normocephalic and atraumatic.  Right Ear: External ear normal.  Left Ear: External ear normal.  Nose: Nose normal. No mucosal edema or rhinorrhea.  Mouth/Throat: Oropharynx is clear and moist and mucous membranes are normal. No dental abscesses or uvula swelling.  Eyes: Conjunctivae and EOM are normal. Pupils are equal, round, and reactive to light.  L eye is deviated laterally.  Neck: Normal range of motion and full passive range of motion without pain. Neck supple.  Cardiovascular: Normal rate, regular rhythm and normal heart sounds.  Exam reveals no gallop and no friction rub.   No murmur heard. Pulmonary/Chest: Effort normal and breath sounds normal. No respiratory distress. He has no wheezes. He has no rhonchi. He has no rales. He exhibits no tenderness and no crepitus.  Abdominal: Soft. Normal appearance and bowel sounds are normal. He exhibits no distension. There is no tenderness. There is no rebound and no guarding.  Musculoskeletal: Normal range of motion. He exhibits no edema and no tenderness.  Kyphosis. Moves all extremities well.   Neurological: He is alert and oriented to person, place, and time. He has normal strength. No cranial nerve deficit.  Skin: Skin is warm, dry and intact. No rash noted. No erythema. No pallor.  Brownish discoloration of lower legs bilaterally.  Psychiatric: He has a normal  mood and affect. His speech is normal and behavior is normal. His mood appears not anxious.   ED Course  Procedures   Medications - No data to display  DIAGNOSTIC STUDIES: Oxygen Saturation is 97% on RA, normal by my interpretation.    COORDINATION OF CARE: 12:33 PM-Discussed treatment plan which includes labs for further tx. Pt agreed to plan.   15:00 feeling better, will see if he can ambulate and if does well can be discharged  Orthostatic vital signs were negative for hypertension or tachycardia.  Patient ambulate to the bathroom with minimal assistance. He states he feels better. He feels ready for discharge. His wife is here.  Labs Review Results for orders placed during the hospital encounter of 01/17/14  CBC WITH DIFFERENTIAL      Result Value Ref Range   WBC 9.6  4.0 - 10.5 K/uL   RBC 3.83 (*) 4.22 - 5.81 MIL/uL   Hemoglobin 12.5 (*) 13.0 - 17.0 g/dL   HCT 37.4 (*) 39.0 - 52.0 %   MCV 97.7  78.0 - 100.0 fL   MCH 32.6  26.0 - 34.0 pg   MCHC 33.4  30.0 - 36.0 g/dL   RDW 12.3  11.5 - 15.5 %   Platelets 154  150 - 400 K/uL   Neutrophils Relative % 82 (*) 43 - 77 %   Neutro Abs 8.0 (*) 1.7 - 7.7 K/uL   Lymphocytes Relative 10 (*) 12 - 46 %   Lymphs Abs 0.9  0.7 - 4.0 K/uL   Monocytes Relative 6  3 - 12 %   Monocytes Absolute 0.5  0.1 - 1.0 K/uL   Eosinophils Relative 1  0 - 5 %   Eosinophils Absolute 0.1  0.0 - 0.7 K/uL   Basophils Relative 1  0 - 1 %   Basophils Absolute 0.1  0.0 - 0.1 K/uL  COMPREHENSIVE METABOLIC PANEL      Result Value Ref Range   Sodium 138  137 - 147 mEq/L   Potassium 4.5  3.7 - 5.3 mEq/L   Chloride 102  96 - 112 mEq/L   CO2 27  19 - 32 mEq/L   Glucose, Bld 108 (*) 70 - 99 mg/dL   BUN 36 (*) 6 - 23 mg/dL   Creatinine, Ser 2.28 (*) 0.50 - 1.35 mg/dL   Calcium 8.2 (*) 8.4 - 10.5 mg/dL   Total Protein 6.6  6.0 - 8.3 g/dL   Albumin 3.2 (*) 3.5 - 5.2 g/dL   AST 22  0 - 37 U/L   ALT 14  0 - 53 U/L   Alkaline Phosphatase 55  39 - 117 U/L    Total Bilirubin 0.4  0.3 - 1.2 mg/dL   GFR calc non Af Amer 26 (*) >90 mL/min   GFR calc Af Amer 31 (*) >90 mL/min  TROPONIN I      Result Value Ref Range   Troponin I <0.30  <0.30 ng/mL  PRO B NATRIURETIC PEPTIDE      Result Value Ref Range   Pro B Natriuretic peptide (BNP) 351.1  0 - 450 pg/mL  I-STAT TROPOININ, ED      Result Value Ref Range   Troponin i, poc 0.01  0.00 - 0.08 ng/mL   Comment 3            Laboratory interpretation all normal except stable renal insufficiency, stable mild anemia   Dg Chest Portable 1 View  01/17/2014   CLINICAL DATA:  NEAR SYNCOPE SOB  EXAM: PORTABLE CHEST - 1 VIEW  COMPARISON:  DG CHEST 2 VIEW dated 08/23/2012  FINDINGS: Low lung volumes. Cardiac silhouette is enlarged. This study was obtained with the markedly kyphotic positioning of the patient. No acute osseous abnormalities.  IMPRESSION: No active disease.   Electronically Signed   By: Margaree Mackintosh M.D.   On: 01/17/2014 13:11      Imaging Review    EKG Interpretation   Date/Time:  Tuesday Jan 17 2014 11:39:29 EDT Ventricular Rate:  49 PR Interval:    QRS Duration: 157 QT Interval:  565 QTC Calculation: 510 R Axis:   69 Text Interpretation:  Atrial fibrillation IVCD, consider atypical LBBB No  significant change since last tracing Confirmed by  [place]  MD-I, Kristee Angus  ([id number]) on 01/17/2014 12:23:21 PM        Date: 01/17/2014  Rate: 49  Rhythm: indeterminate  QRS Axis: right  Intervals: normal and QT prolonged  ST/T Wave abnormalities: nonspecific ST/T changes  Conduction Disutrbances:left bundle branch block  Narrative Interpretation:   Old EKG Reviewed: unchanged from 23 Aug 2012 with HR 49     MDM   Final diagnoses:  Vasovagal near syncope   Plan discharge  Rolland Porter, MD, FACEP   I personally performed the services described in this documentation, which was scribed in my presence. The recorded information has been reviewed and considered.  Rolland Porter, MD,  Abram Sander    Joshua Norrie, MD 01/17/14 [phone]

## 2014-01-17 NOTE — Discharge Instructions (Signed)
Rest tonight. Recheck if you feel worse.

## 2014-01-17 NOTE — ED Notes (Signed)
Dr Knapp at bedside,  

## 2014-01-17 NOTE — ED Notes (Signed)
Pt requesting something to eat or drink,

## 2014-01-17 NOTE — ED Notes (Signed)
Pt arrived to er for further evaluation of near syncopal, pt was shopping in [place] when he felt dizzy, sat down, on ems arrival pt had hr in 30-40 range, junctional rhythm, bp 89/50, and  pale,, pt given 500 NS fluid bolus while en route by [facility] with improvement of bp 105/63, hr remained in 30-40's, on arrival to er, pt alert, able to answer questions, is difficult to understand due to speech impenitent which is normal for pt, pt states that he was having neck pain that radiated down right arm  prior to becoming dizzy, cms intact all extremities, no weakness noted, ekg performed on arrival, pt sinus brady on monitor with rate of 48, denies any pain, denies any sob, pt skin warm and dry,

## 2014-01-17 NOTE — ED Notes (Signed)
MD at the bedside, pt ambulating to bathroom with tech

## 2014-01-17 NOTE — ED Notes (Signed)
Pt  Updated on plan of care, urinal emptied,

## 2014-01-17 NOTE — ED Notes (Signed)
Patient given discharge instruction, verbalized understand. Patient wheelchair out of the department.  

## 2014-01-18 ENCOUNTER — Encounter (HOSPITAL_COMMUNITY): Payer: Self-pay

## 2014-02-09 ENCOUNTER — Ambulatory Visit (HOSPITAL_COMMUNITY)
Admission: RE | Admit: 2014-02-09 | Discharge: 2014-02-09 | Disposition: A | Discharge status: Home / Self Care | Payer: Medicare HMO | Source: Ambulatory Visit | Attending: Internal Medicine | Admitting: Internal Medicine

## 2014-02-09 VITALS — BP 99/45 | HR 49 | Wt 154.0 lb

## 2014-02-09 DIAGNOSIS — I509 Heart failure, unspecified: Secondary | ICD-10-CM | POA: Insufficient documentation

## 2014-02-09 DIAGNOSIS — I447 Left bundle-branch block, unspecified: Secondary | ICD-10-CM | POA: Insufficient documentation

## 2014-02-09 DIAGNOSIS — I428 Other cardiomyopathies: Secondary | ICD-10-CM | POA: Insufficient documentation

## 2014-02-09 DIAGNOSIS — I5022 Chronic systolic (congestive) heart failure: Secondary | ICD-10-CM

## 2014-02-09 DIAGNOSIS — E782 Mixed hyperlipidemia: Secondary | ICD-10-CM | POA: Insufficient documentation

## 2014-02-09 DIAGNOSIS — M4120 Other idiopathic scoliosis, site unspecified: Secondary | ICD-10-CM | POA: Insufficient documentation

## 2014-02-09 DIAGNOSIS — I4892 Unspecified atrial flutter: Secondary | ICD-10-CM | POA: Insufficient documentation

## 2014-02-09 DIAGNOSIS — E039 Hypothyroidism, unspecified: Secondary | ICD-10-CM | POA: Insufficient documentation

## 2014-02-09 DIAGNOSIS — I2510 Atherosclerotic heart disease of native coronary artery without angina pectoris: Secondary | ICD-10-CM | POA: Insufficient documentation

## 2014-02-09 DIAGNOSIS — N189 Chronic kidney disease, unspecified: Secondary | ICD-10-CM | POA: Insufficient documentation

## 2014-02-09 MED ORDER — AMIODARONE HCL 200 MG PO TABS: 200.0000 mg | ORAL_TABLET | Freq: Every day | ORAL | Status: DC

## 2014-02-09 MED ORDER — FUROSEMIDE 40 MG PO TABS: 40.0000 mg | ORAL_TABLET | ORAL | Status: DC

## 2014-02-09 MED ORDER — ISOSORBIDE MONONITRATE ER 30 MG PO TB24: 30.0000 mg | ORAL_TABLET | Freq: Every morning | ORAL | Status: DC

## 2014-02-09 MED ORDER — SPIRONOLACTONE 25 MG PO TABS: 25.0000 mg | ORAL_TABLET | Freq: Every day | ORAL | Status: DC

## 2014-02-09 MED ORDER — LOSARTAN POTASSIUM 50 MG PO TABS: 50.0000 mg | ORAL_TABLET | Freq: Every day | ORAL | Status: DC

## 2014-02-09 MED ORDER — HYDRALAZINE HCL 25 MG PO TABS: 25.0000 mg | ORAL_TABLET | Freq: Three times a day (TID) | ORAL | Status: DC

## 2014-02-09 MED ORDER — CARVEDILOL 3.125 MG PO TABS: 3.1250 mg | ORAL_TABLET | Freq: Two times a day (BID) | ORAL | Status: DC

## 2014-02-09 NOTE — Patient Instructions (Signed)
Follow up in 6 months   Take lasix  40 mg every other day   Do the following things EVERYDAY: 1) Weigh yourself in the morning before breakfast. Write it down and keep it in a log. 2) Take your medicines as prescribed 3) Eat low salt foods-Limit salt (sodium) to 2000 mg per day.  4) Stay as active as you can everyday 5) Limit all fluids for the day to less than 2 liters

## 2014-02-09 NOTE — Progress Notes (Signed)
Patient ID: [name], male   DOB: Oct 08, 1935, 78 y.o.   MRN: [id number]   ADVANCED HEART FAILURE NOTE PCP: Dr Willey Blade  HPI: [name] is a 78 y/o male with h/o cognitive impairment, renal insufficiency (baseline about 1.5-1.6), severe CHF due to NICM (EF 15-20%), atrial flutter (s/p DC-CV in April 2012) and LBBB. He and his family are not interested in ICD.   12/2010: Coronary angiograms showed insignifcant CAD. Right heart cath (on milrinone) showed a right atrial pressure of about 12, PA pressure was 45/79 with a mean of 27,pulmonary capillary wedge pressure was 12, LV pressure was 85/5 with an EDP of about 8.  Fick cardiac output was 4.4 with a cardiac index of 2.3.  Pulmonary artery saturations were 61 and 63%.  Pulmonary vascular resistance was 3.1 Woods units.   12/2010: TEE-guided cardioversion that showed an EF of about 15-20%.  There was no evidence of left atrial appendage thrombus.  He was successfully cardioverted with 1 shock.  He was started on amiodarone to help maintain sinus rhythm.Given his LBBB we discussed the possibility of BiV-ICD but decided to defer as he was doing so well.  02/2011: ECHO with EF 123XX123, Grade 1 diastolic dysfunction, ventricular septum dyssynergy.  Mildly reduced RV systolic function.   06/28/12 ECHO EF 25-30%  He returns for follow up with his brother. He was evaluated at [facility] ED last week due to near syncope.He was later discharged with no significant findings. Yesterday he saw Dr Willey Blade and no changes were made. He says he has been losing weight because he is adjusting his belt to make it tighter.  Appetite good.  Remains active. Denies SOB/PND/Orthopnea/edema/palpitations.   Brother prepares his pill box. Continues on Eliquis. No bleeding problems noted.     ROS: All systems negative except as listed in HPI, PMH and Problem List.  Past Medical History  Diagnosis Date  . Cardiomyopathy     Nonischemic, LVEF 20%  . Left bundle branch block   . Chronic  renal insufficiency   . Hypothyroidism   . Scoliosis   . Mixed hyperlipidemia     Current Outpatient Prescriptions  Medication Sig Dispense Refill  . amiodarone (PACERONE) 200 MG tablet Take 1 tablet (200 mg total) by mouth daily.  30 tablet  6  . aspirin 81 MG tablet Take 81 mg by mouth every morning.       . carvedilol (COREG) 3.125 MG tablet Take 1 tablet (3.125 mg total) by mouth 2 (two) times daily with a meal.  60 tablet  12  . furosemide (LASIX) 40 MG tablet Take 1 tablet (40 mg total) by mouth every morning. Take an extra tab if wt 146 or greater  40 tablet  12  . hydrALAZINE (APRESOLINE) 25 MG tablet Take 1 tablet (25 mg total) by mouth 3 (three) times daily.  90 tablet  12  . isosorbide mononitrate (IMDUR) 30 MG 24 hr tablet Take 1 tablet (30 mg total) by mouth every morning.  30 tablet  12  . levothyroxine (SYNTHROID, LEVOTHROID) 175 MCG tablet Take 175 mcg by mouth daily before breakfast.      . losartan (COZAAR) 50 MG tablet Take 1 tablet (50 mg total) by mouth daily.  30 tablet  12  . spironolactone (ALDACTONE) 25 MG tablet Take 1 tablet (25 mg total) by mouth daily.  30 tablet  12  . loratadine (CLARITIN) 10 MG tablet Take 10 mg by mouth daily.  No current facility-administered medications for this encounter.     PHYSICAL EXAM: Filed Vitals:   02/09/14 1054  BP: 99/45  Pulse: 49  Weight: 154 lb (69.854 kg)  SpO2: 97%    General:  Well appearing. No resp difficulty. Brother present Severe kyphosis HEENT: normal Neck: supple. JVP flat. Carotids 2+ bilaterally; no bruits. No lymphadenopathy or thryomegaly appreciated. Cor: PMI laterally displace. Brady Regular No rubs, gallops.  MR  Lungs: Clear. Decreased throughout Abdomen: soft, nontender, nondistended. No hepatosplenomegaly. No bruits or masses. Good bowel sounds. Extremities: no cyanosis, clubbing, rash. No lower extremity edema. Stockings on.  Neuro: alert & orientedx3, cranial nerves grossly intact.  Moves all 4 extremities w/o difficulty. Affect pleasant.    ASSESSMENT & PLAN:  1. Chronic systolic HF ECHO 0000000  EF%. 25-30% Offered ICD in the past but he declined.   -Volume status low. Weight down 8 pounds from last visit. He is unable to weigh because he can not see the scale but he had to make his belt tighter. Changed lasix to 40 mg every other day.  Continue carvedilol 3.125 mg twice a day Continue hydralazine 25 mg tid/Imdur 30 mg daily  Continue losartan 50 mg daily. If renal function worsens will need to stop losartan.   2. AFL - maintaining SR on amio. On apixaban. Dr. Willey Blade following labs. Will ask him to get LFTs and TFSTs q6 months for amio f/u 3. CKD, stage 3 - Baseline 2.0-2.2  4. LBBB  Follow up in 6 months with Dr [name].    Amy D Clegg NP-C  10:56 AM

## 2014-05-09 ENCOUNTER — Other Ambulatory Visit (HOSPITAL_COMMUNITY): Payer: Self-pay | Admitting: Anesthesiology

## 2014-05-09 DIAGNOSIS — I5022 Chronic systolic (congestive) heart failure: Secondary | ICD-10-CM

## 2014-08-22 LAB — BASIC METABOLIC PANEL
BUN: 15 (ref 4–21)
Creatinine: 0.7 (ref 0.6–1.3)
GLUCOSE: 137
POTASSIUM: 4.3 (ref 3.4–5.3)
Sodium: 131 — AB (ref 137–147)

## 2014-08-24 ENCOUNTER — Encounter (HOSPITAL_COMMUNITY): Payer: Self-pay | Admitting: Internal Medicine

## 2014-08-24 MED ORDER — APIXABAN 5 MG PO TABS
5.0000 mg | ORAL_TABLET | Freq: Two times a day (BID) | ORAL | Status: DC
Start: 2014-08-24 — End: 2015-11-12

## 2014-08-24 NOTE — Progress Notes (Signed)
[name] Eliquis was removed from his medication list on 01/27/14 when pt was seen in the ER, not sure why this was done and there is no mention that medication should have been discontinued, all of Dr Bensimhon's note state pt should continue on Eliquis, pt's brother states pt has been taking Eliquis and it was never stopped.  Med added back to medication list, form completed for assistance through [facility] pt assistance program and given to pt's brother

## 2014-09-27 ENCOUNTER — Encounter (HOSPITAL_COMMUNITY): Payer: Self-pay

## 2014-09-27 ENCOUNTER — Ambulatory Visit (HOSPITAL_COMMUNITY)
Admission: RE | Admit: 2014-09-27 | Discharge: 2014-09-27 | Disposition: A | Discharge status: Home / Self Care | Payer: Medicare HMO | Source: Ambulatory Visit | Attending: Internal Medicine | Admitting: Internal Medicine

## 2014-09-27 VITALS — BP 123/43 | HR 53 | Resp 18 | Wt 153.4 lb

## 2014-09-27 DIAGNOSIS — E039 Hypothyroidism, unspecified: Secondary | ICD-10-CM | POA: Diagnosis not present

## 2014-09-27 DIAGNOSIS — Z79899 Other long term (current) drug therapy: Secondary | ICD-10-CM | POA: Insufficient documentation

## 2014-09-27 DIAGNOSIS — I447 Left bundle-branch block, unspecified: Secondary | ICD-10-CM | POA: Diagnosis not present

## 2014-09-27 DIAGNOSIS — I5022 Chronic systolic (congestive) heart failure: Secondary | ICD-10-CM | POA: Diagnosis not present

## 2014-09-27 DIAGNOSIS — I4892 Unspecified atrial flutter: Secondary | ICD-10-CM

## 2014-09-27 DIAGNOSIS — N183 Chronic kidney disease, stage 3 (moderate): Secondary | ICD-10-CM | POA: Insufficient documentation

## 2014-09-27 DIAGNOSIS — Z7982 Long term (current) use of aspirin: Secondary | ICD-10-CM | POA: Diagnosis not present

## 2014-09-27 DIAGNOSIS — I429 Cardiomyopathy, unspecified: Secondary | ICD-10-CM | POA: Diagnosis not present

## 2014-09-27 DIAGNOSIS — M419 Scoliosis, unspecified: Secondary | ICD-10-CM | POA: Diagnosis not present

## 2014-09-27 DIAGNOSIS — E782 Mixed hyperlipidemia: Secondary | ICD-10-CM | POA: Insufficient documentation

## 2014-09-27 MED ORDER — AMIODARONE HCL 200 MG PO TABS
200.0000 mg | ORAL_TABLET | Freq: Every day | ORAL | Status: DC
Start: 2014-09-27 — End: 2015-10-15

## 2014-09-27 NOTE — Patient Instructions (Signed)
Follow up in 6 months 

## 2014-09-27 NOTE — Progress Notes (Signed)
Patient ID: [name], male   DOB: 08/06/1936, 79 y.o.   MRN: [id number]   ADVANCED HEART FAILURE NOTE PCP: Dr Willey Blade  HPI: Tyee is a 78y/o male with h/o cognitive impairment, renal insufficiency (baseline about 1.5-1.6), severe CHF due to NICM (EF 15-20%), atrial flutter (s/p DC-CV in April 2012) and LBBB. He and his family are not interested in ICD.   12/2010: Coronary angiograms showed insignifcant CAD. Right heart cath (on milrinone) showed a right atrial pressure of about 12, PA pressure was 45/79 with a mean of 27,pulmonary capillary wedge pressure was 12, LV pressure was 85/5 with an EDP of about 8.  Fick cardiac output was 4.4 with a cardiac index of 2.3.  Pulmonary artery saturations were 61 and 63%.  Pulmonary vascular resistance was 3.1 Woods units.   12/2010: TEE-guided cardioversion that showed an EF of about 15-20%.  There was no evidence of left atrial appendage thrombus.  He was successfully cardioverted with 1 shock.  He was started on amiodarone to help maintain sinus rhythm.Given his LBBB we discussed the possibility of BiV-ICD but decided to defer as he was doing so well.  02/2011: ECHO with EF 123XX123, Grade 1 diastolic dysfunction, ventricular septum dyssynergy.  Mildly reduced RV systolic function.   06/28/12 ECHO EF 25-30%  He returns for follow up with his brother. Last visit lasix was changed to every other day. Denies SOB/PND/Orthopnea. Taking all medications.  Brother prepares his pill box. Continues on Eliquis. No bleeding problems noted.     ROS: All systems negative except as listed in HPI, PMH and Problem List.  Past Medical History  Diagnosis Date  . Cardiomyopathy     Nonischemic, LVEF 20%  . Left bundle branch block   . Chronic renal insufficiency   . Hypothyroidism   . Scoliosis   . Mixed hyperlipidemia     Current Outpatient Prescriptions  Medication Sig Dispense Refill  . amiodarone (PACERONE) 200 MG tablet Take 1 tablet (200 mg total) by  mouth daily. 30 tablet 6  . apixaban (ELIQUIS) 5 MG TABS tablet Take 1 tablet (5 mg total) by mouth 2 (two) times daily. 60 tablet   . aspirin 81 MG tablet Take 81 mg by mouth every morning.     . carvedilol (COREG) 3.125 MG tablet Take 1 tablet (3.125 mg total) by mouth 2 (two) times daily with a meal. 60 tablet 12  . furosemide (LASIX) 40 MG tablet Take 1 tablet (40 mg total) by mouth every other day. 40 tablet 12  . hydrALAZINE (APRESOLINE) 25 MG tablet Take 1 tablet (25 mg total) by mouth 3 (three) times daily. 90 tablet 12  . isosorbide mononitrate (IMDUR) 30 MG 24 hr tablet Take 1 tablet (30 mg total) by mouth every morning. 30 tablet 12  . levothyroxine (SYNTHROID, LEVOTHROID) 175 MCG tablet Take 175 mcg by mouth daily before breakfast.    . loratadine (CLARITIN) 10 MG tablet Take 10 mg by mouth daily.    [name] losartan (COZAAR) 50 MG tablet Take 1 tablet (50 mg total) by mouth daily. 30 tablet 12  . spironolactone (ALDACTONE) 25 MG tablet Take 1 tablet (25 mg total) by mouth daily. 30 tablet 12   No current facility-administered medications for this encounter.     PHYSICAL EXAM: Filed Vitals:   09/27/14 1032  BP: 123/43  Pulse: 53  Resp: 18  Weight: 153 lb 7 oz (69.599 kg)  SpO2: 97%    General:  Well appearing. No  resp difficulty. Brother present Severe kyphosis HEENT: normal Neck: supple. JVP flat. Carotids 2+ bilaterally; no bruits. No lymphadenopathy or thryomegaly appreciated. Cor: PMI laterally displace. Brady Regular No rubs, gallops.  MR  Lungs: Clear. Decreased throughout Abdomen: soft, nontender, nondistended. No hepatosplenomegaly. No bruits or masses. Good bowel sounds. Extremities: no cyanosis, clubbing, rash. No lower extremity edema. Stockings on.  Neuro: alert & orientedx3, cranial nerves grossly intact. Moves all 4 extremities w/o difficulty. Affect pleasant.    ASSESSMENT & PLAN:  1. Chronic systolic HF ECHO 0000000  EF%. 25-30% Offered ICD in the past but  he declined.   -NYHA II. Volume status low.  Continue lasix to 40 mg every other day.  Continue carvedilol 3.125 mg twice a day Continue hydralazine 25 mg tid/Imdur 30 mg daily  Continue losartan 50 mg daily. If renal function worsens will need to stop losartan.  2. AFL - maintaining SR on amio. On apixaban. Dr. Willey Blade following labs.  3. CKD, stage 3 - Baseline 2.0-2.2  4. LBBB  Will contact Dr Willey Blade for lab results.  Follow up in 6 months with Dr Haroldine Laws.    CLEGG,AMY NP-C  10:59 AM

## 2014-12-14 DIAGNOSIS — Z79899 Other long term (current) drug therapy: Secondary | ICD-10-CM | POA: Diagnosis not present

## 2014-12-21 DIAGNOSIS — I5022 Chronic systolic (congestive) heart failure: Secondary | ICD-10-CM | POA: Diagnosis not present

## 2014-12-21 DIAGNOSIS — N183 Chronic kidney disease, stage 3 (moderate): Secondary | ICD-10-CM | POA: Diagnosis not present

## 2014-12-21 DIAGNOSIS — I4892 Unspecified atrial flutter: Secondary | ICD-10-CM | POA: Diagnosis not present

## 2015-02-11 ENCOUNTER — Encounter: Payer: Self-pay | Admitting: Internal Medicine

## 2015-02-12 ENCOUNTER — Other Ambulatory Visit (HOSPITAL_COMMUNITY): Payer: Self-pay | Admitting: Adult Health

## 2015-02-18 ENCOUNTER — Other Ambulatory Visit (HOSPITAL_COMMUNITY): Payer: Self-pay | Admitting: Adult Health

## 2015-02-19 ENCOUNTER — Other Ambulatory Visit (HOSPITAL_COMMUNITY): Payer: Self-pay

## 2015-02-19 ENCOUNTER — Other Ambulatory Visit (HOSPITAL_COMMUNITY): Payer: Self-pay | Admitting: Adult Health

## 2015-02-19 MED ORDER — HYDRALAZINE HCL 25 MG PO TABS: 25.0000 mg | ORAL_TABLET | Freq: Three times a day (TID) | ORAL | Status: DC

## 2015-03-18 ENCOUNTER — Other Ambulatory Visit (HOSPITAL_COMMUNITY): Payer: Self-pay | Admitting: Adult Health

## 2015-03-22 ENCOUNTER — Other Ambulatory Visit (HOSPITAL_COMMUNITY): Payer: Self-pay | Admitting: Adult Health

## 2015-04-02 DIAGNOSIS — Z79899 Other long term (current) drug therapy: Secondary | ICD-10-CM | POA: Diagnosis not present

## 2015-04-02 DIAGNOSIS — I509 Heart failure, unspecified: Secondary | ICD-10-CM | POA: Diagnosis not present

## 2015-04-03 LAB — BASIC METABOLIC PANEL
BUN: 32 — AB (ref 4–21)
CREATININE: 1.9 — AB (ref 0.6–1.3)
GLUCOSE: 98
POTASSIUM: 4.9 (ref 3.4–5.3)
SODIUM: 140 (ref 137–147)

## 2015-04-09 DIAGNOSIS — I5022 Chronic systolic (congestive) heart failure: Secondary | ICD-10-CM | POA: Diagnosis not present

## 2015-04-09 DIAGNOSIS — Z6829 Body mass index (BMI) 29.0-29.9, adult: Secondary | ICD-10-CM | POA: Diagnosis not present

## 2015-04-09 DIAGNOSIS — N183 Chronic kidney disease, stage 3 (moderate): Secondary | ICD-10-CM | POA: Diagnosis not present

## 2015-04-09 DIAGNOSIS — D692 Other nonthrombocytopenic purpura: Secondary | ICD-10-CM | POA: Diagnosis not present

## 2015-04-16 ENCOUNTER — Other Ambulatory Visit (HOSPITAL_COMMUNITY): Payer: Self-pay | Admitting: Adult Health

## 2015-04-23 ENCOUNTER — Other Ambulatory Visit (HOSPITAL_COMMUNITY): Payer: Self-pay | Admitting: Adult Health

## 2015-04-26 DIAGNOSIS — L718 Other rosacea: Secondary | ICD-10-CM | POA: Diagnosis not present

## 2015-04-26 DIAGNOSIS — L570 Actinic keratosis: Secondary | ICD-10-CM | POA: Diagnosis not present

## 2015-04-26 DIAGNOSIS — L821 Other seborrheic keratosis: Secondary | ICD-10-CM | POA: Diagnosis not present

## 2015-04-26 DIAGNOSIS — D225 Melanocytic nevi of trunk: Secondary | ICD-10-CM | POA: Diagnosis not present

## 2015-04-26 DIAGNOSIS — X32XXXA Exposure to sunlight, initial encounter: Secondary | ICD-10-CM | POA: Diagnosis not present

## 2015-05-14 ENCOUNTER — Other Ambulatory Visit (HOSPITAL_COMMUNITY): Payer: Self-pay | Admitting: Adult Health

## 2015-05-21 ENCOUNTER — Other Ambulatory Visit (HOSPITAL_COMMUNITY): Payer: Self-pay | Admitting: Adult Health

## 2015-05-28 ENCOUNTER — Other Ambulatory Visit (HOSPITAL_COMMUNITY): Payer: Self-pay | Admitting: Adult Health

## 2015-06-08 DIAGNOSIS — I4892 Unspecified atrial flutter: Secondary | ICD-10-CM | POA: Diagnosis not present

## 2015-06-08 DIAGNOSIS — I509 Heart failure, unspecified: Secondary | ICD-10-CM | POA: Diagnosis not present

## 2015-06-08 DIAGNOSIS — E039 Hypothyroidism, unspecified: Secondary | ICD-10-CM | POA: Diagnosis not present

## 2015-06-08 DIAGNOSIS — N19 Unspecified kidney failure: Secondary | ICD-10-CM | POA: Diagnosis not present

## 2015-06-08 DIAGNOSIS — Z79899 Other long term (current) drug therapy: Secondary | ICD-10-CM | POA: Diagnosis not present

## 2015-06-11 ENCOUNTER — Other Ambulatory Visit (HOSPITAL_COMMUNITY): Payer: Self-pay | Admitting: Adult Health

## 2015-06-11 LAB — HEPATIC FUNCTION PANEL
ALT: 10 (ref 10–40)
AST: 17 (ref 14–40)
BILIRUBIN DIRECT: 0.1 (ref 0.01–0.4)
Bilirubin, Total: 0.6

## 2015-06-11 LAB — BASIC METABOLIC PANEL
BUN: 16 (ref 4–21)
CREATININE: 0.7 (ref 0.6–1.3)
Glucose: 85
POTASSIUM: 4.4 (ref 3.4–5.3)
SODIUM: 132 — AB (ref 137–147)

## 2015-06-11 LAB — LIPID PANEL
Cholesterol: 215 — AB (ref 0–200)
HDL: 51 (ref 35–70)
LDL Cholesterol: 115
Triglycerides: 248 — AB (ref 40–160)

## 2015-06-11 LAB — CBC AND DIFFERENTIAL
HEMATOCRIT: 40 — AB (ref 41–53)
Hemoglobin: 13.3 — AB (ref 13.5–17.5)
Platelets: 220 (ref 150–399)
WBC: 7.5

## 2015-06-11 LAB — TSH: TSH: 2.62 (ref 0.41–5.90)

## 2015-06-14 ENCOUNTER — Encounter: Payer: Self-pay | Admitting: Internal Medicine

## 2015-06-14 DIAGNOSIS — Z6829 Body mass index (BMI) 29.0-29.9, adult: Secondary | ICD-10-CM | POA: Diagnosis not present

## 2015-06-14 DIAGNOSIS — Z0001 Encounter for general adult medical examination with abnormal findings: Secondary | ICD-10-CM | POA: Diagnosis not present

## 2015-06-14 DIAGNOSIS — Z23 Encounter for immunization: Secondary | ICD-10-CM | POA: Diagnosis not present

## 2015-06-14 DIAGNOSIS — I5022 Chronic systolic (congestive) heart failure: Secondary | ICD-10-CM | POA: Diagnosis not present

## 2015-06-14 DIAGNOSIS — N183 Chronic kidney disease, stage 3 (moderate): Secondary | ICD-10-CM | POA: Diagnosis not present

## 2015-06-17 ENCOUNTER — Other Ambulatory Visit (HOSPITAL_COMMUNITY): Payer: Self-pay | Admitting: Adult Health

## 2015-07-16 ENCOUNTER — Other Ambulatory Visit (HOSPITAL_COMMUNITY): Payer: Self-pay | Admitting: Adult Health

## 2015-07-18 ENCOUNTER — Encounter: Payer: Self-pay | Admitting: Internal Medicine

## 2015-07-23 ENCOUNTER — Other Ambulatory Visit (HOSPITAL_COMMUNITY): Payer: Self-pay | Admitting: Adult Health

## 2015-07-26 ENCOUNTER — Encounter (HOSPITAL_COMMUNITY): Payer: Self-pay | Admitting: Emergency Medicine

## 2015-07-26 ENCOUNTER — Emergency Department (HOSPITAL_COMMUNITY): Payer: Commercial Managed Care - HMO

## 2015-07-26 ENCOUNTER — Emergency Department (HOSPITAL_COMMUNITY)
Admission: EM | Admit: 2015-07-26 | Discharge: 2015-07-26 | Disposition: A | Discharge status: Home / Self Care | Payer: Commercial Managed Care - HMO | Attending: Emergency Medicine | Admitting: Emergency Medicine

## 2015-07-26 DIAGNOSIS — Y9289 Other specified places as the place of occurrence of the external cause: Secondary | ICD-10-CM | POA: Diagnosis not present

## 2015-07-26 DIAGNOSIS — Y9389 Activity, other specified: Secondary | ICD-10-CM | POA: Diagnosis not present

## 2015-07-26 DIAGNOSIS — W19XXXA Unspecified fall, initial encounter: Secondary | ICD-10-CM

## 2015-07-26 DIAGNOSIS — S299XXA Unspecified injury of thorax, initial encounter: Secondary | ICD-10-CM | POA: Diagnosis not present

## 2015-07-26 DIAGNOSIS — S3289XA Fracture of other parts of pelvis, initial encounter for closed fracture: Secondary | ICD-10-CM | POA: Diagnosis not present

## 2015-07-26 DIAGNOSIS — Z043 Encounter for examination and observation following other accident: Secondary | ICD-10-CM | POA: Diagnosis not present

## 2015-07-26 DIAGNOSIS — W1839XA Other fall on same level, initial encounter: Secondary | ICD-10-CM | POA: Diagnosis not present

## 2015-07-26 DIAGNOSIS — Z8679 Personal history of other diseases of the circulatory system: Secondary | ICD-10-CM | POA: Diagnosis not present

## 2015-07-26 DIAGNOSIS — Y998 Other external cause status: Secondary | ICD-10-CM | POA: Insufficient documentation

## 2015-07-26 DIAGNOSIS — M419 Scoliosis, unspecified: Secondary | ICD-10-CM | POA: Insufficient documentation

## 2015-07-26 DIAGNOSIS — E039 Hypothyroidism, unspecified: Secondary | ICD-10-CM | POA: Diagnosis not present

## 2015-07-26 DIAGNOSIS — Z7901 Long term (current) use of anticoagulants: Secondary | ICD-10-CM | POA: Insufficient documentation

## 2015-07-26 DIAGNOSIS — Z7982 Long term (current) use of aspirin: Secondary | ICD-10-CM | POA: Diagnosis not present

## 2015-07-26 DIAGNOSIS — R55 Syncope and collapse: Secondary | ICD-10-CM | POA: Diagnosis not present

## 2015-07-26 DIAGNOSIS — Z79899 Other long term (current) drug therapy: Secondary | ICD-10-CM | POA: Insufficient documentation

## 2015-07-26 DIAGNOSIS — I6789 Other cerebrovascular disease: Secondary | ICD-10-CM | POA: Diagnosis not present

## 2015-07-26 DIAGNOSIS — S34109A Unspecified injury to unspecified level of lumbar spinal cord, initial encounter: Secondary | ICD-10-CM | POA: Diagnosis not present

## 2015-07-26 DIAGNOSIS — N189 Chronic kidney disease, unspecified: Secondary | ICD-10-CM | POA: Insufficient documentation

## 2015-07-26 DIAGNOSIS — R4781 Slurred speech: Secondary | ICD-10-CM | POA: Diagnosis not present

## 2015-07-26 NOTE — ED Provider Notes (Signed)
CSN: [id number]     Arrival date & time 07/26/15  1440 History   First MD Initiated Contact with Patient 07/26/15 1459     Chief Complaint  Patient presents with  . Fall     (Consider location/radiation/quality/duration/timing/severity/associated sxs/prior Treatment) HPI Comments: Patient from home after slip and fall. States he was bending over to check in his dishwasher and kneeling on one knee when he began to feel weak and had to lower himself to the floor onto his back and buttocks. Denies hitting his head or losing consciousness. Denies any head, neck, back, chest or abdominal pain. He had a health aide at home who is not able to get him off the floor. He usually walks with a walker but was not using it. He denies any injury from the fall. He is at his baseline mental status per family. Patient takes eliquis for nonischemic cardiomyopathy. He denies any dizziness or lightheadedness. He denies any focal weakness, numbness or tingling.  Patient is a 79 y.o. male presenting with fall. The history is provided by the patient and the EMS personnel.  Fall Pertinent negatives include no chest pain, no abdominal pain and no shortness of breath.    Past Medical History  Diagnosis Date  . Cardiomyopathy     Nonischemic, LVEF 20%  . Left bundle branch block   . Chronic renal insufficiency   . Hypothyroidism   . Scoliosis   . Mixed hyperlipidemia    Past Surgical History  Procedure Laterality Date  . Laparoscopic cholecystectomy  2008  . Right inguinal herniorrhaphy    . Left foot surgery  1998   Family History  Problem Relation Age of Onset  . Heart disease Father     Died age 1  . Stroke Mother     Died age 55   Social History  Substance Use Topics  . Smoking status: Never Smoker   . Smokeless tobacco: Never Used  . Alcohol Use: No    Review of Systems  Constitutional: Negative for fever, activity change and appetite change.  HENT: Negative for congestion and rhinorrhea.    Eyes: Negative for visual disturbance.  Respiratory: Negative for cough, chest tightness and shortness of breath.   Cardiovascular: Negative for chest pain.  Gastrointestinal: Negative for nausea, vomiting and abdominal pain.  Genitourinary: Negative for dysuria, urgency, hematuria and testicular pain.  Musculoskeletal: Negative for myalgias and arthralgias.  Skin: Negative for rash.  Neurological: Negative for dizziness, facial asymmetry, weakness and light-headedness.  A complete 10 system review of systems was obtained and all systems are negative except as noted in the HPI and PMH.      Allergies  Review of patient's allergies indicates no known allergies.  Home Medications   Prior to Admission medications   Medication Sig Start Date End Date Taking? Authorizing Provider  amiodarone (PACERONE) 200 MG tablet Take 1 tablet (200 mg total) by mouth daily. 09/27/14  Yes Amy D Clegg, NP  apixaban (ELIQUIS) 5 MG TABS tablet Take 1 tablet (5 mg total) by mouth 2 (two) times daily. 08/24/14  Yes Jolaine Artist, MD  aspirin EC 81 MG tablet Take 81 mg by mouth daily.   Yes Historical Provider, MD  carvedilol (COREG) 3.125 MG tablet TAKE ONE TABLET BY MOUTH TWICE DAILY WITH FOOD 07/24/15  Yes Amy D Clegg, NP  furosemide (LASIX) 40 MG tablet TAKE ONE TABLET BY MOUTH EVERY OTHER DAY, TAKE AN EXTRA TABLET IF WEIGHT 146 OR GREATER 05/29/15  Yes  Amy Estrella Deeds, NP  hydrALAZINE (APRESOLINE) 25 MG tablet Take 1 tablet (25 mg total) by mouth 3 (three) times daily. 02/19/15  Yes Shaune Pascal Bensimhon, MD  isosorbide mononitrate (IMDUR) 30 MG 24 hr tablet TAKE ONE TABLET BY MOUTH IN THE MORNING 07/17/15  Yes Amy D Clegg, NP  levothyroxine (SYNTHROID, LEVOTHROID) 175 MCG tablet Take 175 mcg by mouth daily before breakfast.   Yes Historical Provider, MD  loratadine (CLARITIN) 10 MG tablet Take 10 mg by mouth daily.   Yes Historical Provider, MD  losartan (COZAAR) 50 MG tablet TAKE ONE TABLET BY MOUTH ONCE DAILY  07/17/15  Yes Amy D Clegg, NP  spironolactone (ALDACTONE) 25 MG tablet TAKE ONE TABLET BY MOUTH ONCE DAILY 07/17/15  Yes Amy D Clegg, NP  furosemide (LASIX) 40 MG tablet Take 1 tablet (40 mg total) by mouth every other day. Patient not taking: Reported on 07/26/2015 02/09/14   Amy D Clegg, NP   BP 131/62 mmHg  Pulse 55  Temp(Src) 97.8 F (36.6 C) (Oral)  Resp 17  Ht 5\' 5"  (1.651 m)  Wt 160 lb (72.576 kg)  BMI 26.63 kg/m2  SpO2 97% Physical Exam  Constitutional: He is oriented to person, place, and time. He appears well-developed and well-nourished. No distress.  HENT:  Head: Normocephalic and atraumatic.  Mouth/Throat: Oropharynx is clear and moist. No oropharyngeal exudate.  Eyes: Conjunctivae and EOM are normal. Pupils are equal, round, and reactive to light.  Neck: Normal range of motion. Neck supple.  No C spine tenderness  Cardiovascular: Normal rate, regular rhythm, normal heart sounds and intact distal pulses.   No murmur heard. Pulmonary/Chest: Effort normal and breath sounds normal. No respiratory distress.  Abdominal: Soft. There is no tenderness. There is no rebound and no guarding.  Musculoskeletal: Normal range of motion. He exhibits no edema or tenderness.  No T or L spine tenderness FROm hips without pain  Neurological: He is alert and oriented to person, place, and time. No cranial nerve deficit. He exhibits normal muscle tone. Coordination normal.  No ataxia on finger to nose bilaterally. No pronator drift. 5/5 strength throughout. CN 2-12 intact.Equal grip strength. Sensation intact.   Skin: Skin is warm.  Psychiatric: He has a normal mood and affect. His behavior is normal.  Nursing note and vitals reviewed.   ED Course  Procedures (including critical care time) Labs Review Labs Reviewed - No data to display  Imaging Review Dg Chest 2 View  07/26/2015  CLINICAL DATA:  Recent fall while trying to get up from floor, initial encounter EXAM: CHEST - 2 VIEW  COMPARISON:  None. FINDINGS: Cardiac shadow is enlarged. The lungs are clear bilaterally. No acute bony abnormality is seen. IMPRESSION: No active disease. Electronically Signed   By: Inez Catalina M.D.   On: 07/26/2015 16:44   Dg Lumbar Spine Complete  07/26/2015  CLINICAL DATA:  Fall after standing EXAM: LUMBAR SPINE - COMPLETE 4+ VIEW COMPARISON:  None. FINDINGS: Normal alignment of lumbar vertebral bodies. No loss of vertebral body height or disc height. No pars fracture. No subluxation. IMPRESSION: No acute findings lumbar spine. Electronically Signed   By: Suzy Bouchard M.D.   On: 07/26/2015 16:52   Dg Pelvis 1-2 Views  07/26/2015  CLINICAL DATA:  Legs gave out. EXAM: PELVIS - 1-2 VIEW COMPARISON:  None. FINDINGS: There is no evidence of pelvic fracture or diastasis. No pelvic bone lesions are seen. IMPRESSION: Negative. Electronically Signed   By: Queen Slough.D.  On: 07/26/2015 16:46   Ct Head Wo Contrast  07/26/2015  CLINICAL DATA:  Syncope. EXAM: CT HEAD WITHOUT CONTRAST TECHNIQUE: Contiguous axial images were obtained from the base of the skull through the vertex without intravenous contrast. COMPARISON:  None. FINDINGS: No acute cortical infarct, hemorrhage, or mass lesion ispresent. Ventricles are of normal size. Dilated perivascular space versus chronic left basal ganglia lacunar infarct noted. No significant extra-axial fluid collection is present. The paranasal sinuses andmastoid air cells are clear. The osseous skull is intact. IMPRESSION: 1. No acute intracranial abnormalities identified. Electronically Signed   By: Kerby Moors M.D.   On: 07/26/2015 16:50   I have personally reviewed and evaluated these images and lab results as part of my medical decision-making.   EKG Interpretation   Date/Time:  Thursday July 26 2015 15:26:03 EST Ventricular Rate:  52 PR Interval:  259 QRS Duration: 154 QT Interval:  546 QTC Calculation: 508 R Axis:   56 Text  Interpretation:  Sinus rhythm Prolonged PR interval IVCD, consider  atypical LBBB No significant change was found Confirmed by Wyvonnia Dusky  MD,  Lizann Edelman [phone]) on 07/26/2015 3:43:15 PM      MDM   Final diagnoses:  Fall, initial encounter   Patient lost balance while bending over and lowered himself to the floor. Denies any injury. No head, neck, back, chest or abdominal pain denies any dizziness or lightheadedness.  EKG is unchanged. Patient denies any complaints. Denies any pain. CT head is negative. Lumbar spine x-rays negative. Patient is ambulatory without assistance.  He is at his baseline and appears stable for discharge. Return precautions discussed.  Ezequiel Essex, MD 07/27/15 [id number]

## 2015-07-26 NOTE — ED Notes (Signed)
PT stated he was bending over to check his dishwasher and his legs felt weak and he lowered his self to floor. EMS/PT has homehealth aide with him during the day but she was unable to get him out of the floor. PT denies any pain at this time.

## 2015-07-26 NOTE — ED Notes (Signed)
MD at bedside. 

## 2015-07-26 NOTE — Discharge Instructions (Signed)
Fall Prevention in Hospitals, Adult Your CT and Xrays are negative for serious injury. Follow up with your doctor. Return to the ED if you develop new or worsening symptoms. As a hospital patient, your condition and the treatments you receive can increase your risk for falls. Some additional risk factors for falls in a hospital include:  Being in an unfamiliar environment.  Being on bed rest.  Your surgery.  Taking certain medicines.  Your tubing requirements, such as intravenous (IV) therapy or catheters. It is important that you learn how to decrease fall risks while at the hospital. Below are important tips that can help prevent falls. SAFETY TIPS FOR PREVENTING FALLS Talk about your risk of falling.  Ask your health care provider why you are at risk for falling. Is it your medicine, illness, tubing placement, or something else?  Make a plan with your health care provider to keep you safe from falls.  Ask your health care provider or pharmacist about side effects of your medicines. Some medicines can make you dizzy or affect your coordination. Ask for help.  Ask for help before getting out of bed. You may need to press your call button.  Ask for assistance in getting safely to the toilet.  Ask for a walker or cane to be put at your bedside. Ask that most of the side rails on your bed be placed up before your health care provider leaves the room.  Ask family or friends to sit with you.  Ask for things that are out of your reach, such as your glasses, hearing aids, telephone, bedside table, or call button. Follow these tips to avoid falling:  Stay lying or seated, rather than standing, while waiting for help.  Wear rubber-soled slippers or shoes whenever you walk in the hospital.  Avoid quick, sudden movements.  Change positions slowly.  Sit on the side of your bed before standing.  Stand up slowly and wait before you start to walk.  Let your health care provider know  if there is a spill on the floor.  Pay careful attention to the medical equipment, electrical cords, and tubes around you.  When you need help, use your call button by your bed or in the bathroom. Wait for one of your health care providers to help you.  If you feel dizzy or unsure of your footing, return to bed and wait for assistance.  Avoid being distracted by the TV, telephone, or another person in your room.  Do not lean or support yourself on rolling objects, such as IV poles or bedside tables.   This information is not intended to replace advice given to you by your health care provider. Make sure you discuss any questions you have with your health care provider.   Document Released: 08/29/2000 Document Revised: 09/22/2014 Document Reviewed: 05/09/2012 Elsevier Interactive Patient Education [employer].

## 2015-07-26 NOTE — ED Notes (Signed)
Pt tolerated ambulation around nurses desk and to restroom well with steady gait accompanied by right sided residual which is normal per pt.

## 2015-08-13 ENCOUNTER — Other Ambulatory Visit (HOSPITAL_COMMUNITY): Payer: Self-pay | Admitting: Adult Health

## 2015-08-20 ENCOUNTER — Other Ambulatory Visit (HOSPITAL_COMMUNITY): Payer: Self-pay | Admitting: Adult Health

## 2015-09-15 ENCOUNTER — Other Ambulatory Visit (HOSPITAL_COMMUNITY): Payer: Self-pay | Admitting: Adult Health

## 2015-09-26 DIAGNOSIS — Z79899 Other long term (current) drug therapy: Secondary | ICD-10-CM | POA: Diagnosis not present

## 2015-09-26 DIAGNOSIS — N19 Unspecified kidney failure: Secondary | ICD-10-CM | POA: Diagnosis not present

## 2015-09-26 DIAGNOSIS — E039 Hypothyroidism, unspecified: Secondary | ICD-10-CM | POA: Diagnosis not present

## 2015-09-27 LAB — BASIC METABOLIC PANEL
BUN: 36 — AB (ref 4–21)
CREATININE: 2.2 — AB (ref 0.6–1.3)
Glucose: 95
POTASSIUM: 4.6 (ref 3.4–5.3)
SODIUM: 142 (ref 137–147)

## 2015-10-04 DIAGNOSIS — Z6829 Body mass index (BMI) 29.0-29.9, adult: Secondary | ICD-10-CM | POA: Diagnosis not present

## 2015-10-04 DIAGNOSIS — N184 Chronic kidney disease, stage 4 (severe): Secondary | ICD-10-CM | POA: Diagnosis not present

## 2015-10-04 DIAGNOSIS — L259 Unspecified contact dermatitis, unspecified cause: Secondary | ICD-10-CM | POA: Diagnosis not present

## 2015-10-04 DIAGNOSIS — I5022 Chronic systolic (congestive) heart failure: Secondary | ICD-10-CM | POA: Diagnosis not present

## 2015-10-15 ENCOUNTER — Other Ambulatory Visit (HOSPITAL_COMMUNITY): Payer: Self-pay | Admitting: Internal Medicine

## 2015-10-15 ENCOUNTER — Other Ambulatory Visit (HOSPITAL_COMMUNITY): Payer: Self-pay | Admitting: Adult Health

## 2015-10-17 ENCOUNTER — Encounter (HOSPITAL_COMMUNITY): Payer: Self-pay | Admitting: Internal Medicine

## 2015-10-17 ENCOUNTER — Ambulatory Visit (HOSPITAL_COMMUNITY)
Admission: RE | Admit: 2015-10-17 | Discharge: 2015-10-17 | Disposition: A | Discharge status: Home / Self Care | Payer: Commercial Managed Care - HMO | Source: Ambulatory Visit | Attending: Internal Medicine | Admitting: Internal Medicine

## 2015-10-17 VITALS — BP 104/46 | HR 49 | Wt 160.0 lb

## 2015-10-17 DIAGNOSIS — Z7982 Long term (current) use of aspirin: Secondary | ICD-10-CM | POA: Insufficient documentation

## 2015-10-17 DIAGNOSIS — E782 Mixed hyperlipidemia: Secondary | ICD-10-CM | POA: Insufficient documentation

## 2015-10-17 DIAGNOSIS — I5022 Chronic systolic (congestive) heart failure: Secondary | ICD-10-CM | POA: Insufficient documentation

## 2015-10-17 DIAGNOSIS — Z7902 Long term (current) use of antithrombotics/antiplatelets: Secondary | ICD-10-CM | POA: Diagnosis not present

## 2015-10-17 DIAGNOSIS — I428 Other cardiomyopathies: Secondary | ICD-10-CM | POA: Insufficient documentation

## 2015-10-17 DIAGNOSIS — I4892 Unspecified atrial flutter: Secondary | ICD-10-CM

## 2015-10-17 DIAGNOSIS — N183 Chronic kidney disease, stage 3 (moderate): Secondary | ICD-10-CM | POA: Diagnosis not present

## 2015-10-17 DIAGNOSIS — I447 Left bundle-branch block, unspecified: Secondary | ICD-10-CM | POA: Insufficient documentation

## 2015-10-17 DIAGNOSIS — E039 Hypothyroidism, unspecified: Secondary | ICD-10-CM | POA: Diagnosis not present

## 2015-10-17 DIAGNOSIS — Z79899 Other long term (current) drug therapy: Secondary | ICD-10-CM | POA: Insufficient documentation

## 2015-10-17 MED ORDER — HYDRALAZINE HCL 25 MG PO TABS: 25.0000 mg | ORAL_TABLET | Freq: Three times a day (TID) | ORAL | Status: DC

## 2015-10-17 NOTE — Progress Notes (Signed)
ADVANCED HEART FAILURE CLINIC  Patient ID: [name], male   DOB: [birth year], 80 y.o.   MRN: [id number]   ADVANCED HEART FAILURE NOTE PCP: Dr [name]  HPI: [name] is a 80y/o male with h/o cognitive impairment, renal insufficiency (baseline about 1.5-1.6), severe CHF due to NICM (EF 15-20%), atrial flutter (s/p DC-CV in April 2012) and LBBB. He and his family are not interested in ICD.   12/2010: Coronary angiograms showed insignifcant CAD. Right heart cath (on milrinone) showed a right atrial pressure of about 12, PA pressure was 45/79 with a mean of 27,pulmonary capillary wedge pressure was 12, LV pressure was 85/5 with an EDP of about 8.  Fick cardiac output was 4.4 with a cardiac index of 2.3.  Pulmonary artery saturations were 61 and 63%.  Pulmonary vascular resistance was 3.1 Woods units.   12/2010: TEE-guided cardioversion that showed an EF of about 15-20%.  There was no evidence of left atrial appendage thrombus.  He was successfully cardioverted with 1 shock.  He was started on amiodarone to help maintain sinus rhythm.Given his LBBB we discussed the possibility of BiV-ICD but decided to defer as he was doing so well.  02/2011: ECHO with EF 123XX123, Grade 1 diastolic dysfunction, ventricular septum dyssynergy.  Mildly reduced RV systolic function.   06/28/12 ECHO EF 25-30%  He returns for follow up with his brother. Taking lasix every other day now. Doing well. Walks around [place] without problem. Denies SOB/PND/Orthopnea. Taking all medications.  Brother prepares his pill box. Continues on Eliquis. No bleeding problems noted. BP a bit on low side but never dizzy    Labs (9/16): K 4.5 Creatinine 1.8 TC 144 TG 104 HDL 45 LDL 78  ROS: All systems negative except as listed in HPI, PMH and Problem List.  Past Medical History  Diagnosis Date  . Cardiomyopathy     Nonischemic, LVEF 20%  . Left bundle branch block   . Chronic renal insufficiency   . Hypothyroidism   . Scoliosis   .  Mixed hyperlipidemia     Current Outpatient Prescriptions  Medication Sig Dispense Refill  . amiodarone (PACERONE) 200 MG tablet TAKE ONE TABLET BY MOUTH ONCE DAILY 30 tablet 0  . apixaban (ELIQUIS) 5 MG TABS tablet Take 1 tablet (5 mg total) by mouth 2 (two) times daily. 60 tablet   . aspirin EC 81 MG tablet Take 81 mg by mouth daily.    . carvedilol (COREG) 3.125 MG tablet TAKE ONE TABLET BY MOUTH TWICE DAILY WITH FOOD 60 tablet 3  . furosemide (LASIX) 40 MG tablet Take 1 tablet (40 mg total) by mouth every other day. 40 tablet 12  . furosemide (LASIX) 40 MG tablet TAKE ONE TABLET BY MOUTH EVERY OTHER DAY, MAY TAKE AN EXTRA TABLET IF WEIGHT GAIN OF 146 OR GREATER 40 tablet 0  . hydrALAZINE (APRESOLINE) 25 MG tablet TAKE ONE TABLET BY MOUTH THREE TIMES DAILY 90 tablet 0  . isosorbide mononitrate (IMDUR) 30 MG 24 hr tablet TAKE ONE TABLET BY MOUTH IN THE MORNING 30 tablet 0  . levothyroxine (SYNTHROID, LEVOTHROID) 200 MCG tablet Take 200 mcg by mouth daily before breakfast.    . loratadine (CLARITIN) 10 MG tablet Take 10 mg by mouth daily.    [name] losartan (COZAAR) 50 MG tablet TAKE ONE TABLET BY MOUTH ONCE DAILY 30 tablet 3  . spironolactone (ALDACTONE) 25 MG tablet TAKE ONE TABLET BY MOUTH ONCE DAILY 30 tablet 3   No current facility-administered medications  for this encounter.     PHYSICAL EXAM: Filed Vitals:   10/17/15 1115  BP: 104/46  Pulse: 49  Weight: 160 lb (72.576 kg)  SpO2: 97%    General:  Well appearing. No resp difficulty. Brother present Severe kyphosis HEENT: normal Neck: supple. JVP flat. Carotids 2+ bilaterally; no bruits. No lymphadenopathy or thryomegaly appreciated. Cor: PMI laterally displace. Brady Regular No rubs, gallops.  Soft MR murmur Lungs: Clear. Decreased throughout Abdomen: soft, nontender, nondistended. No hepatosplenomegaly. No bruits or masses. Good bowel sounds. Extremities: no cyanosis, clubbing, rash. No lower extremity edema. Stockings on.    Neuro: alert & orientedx3, cranial nerves grossly intact. Moves all 4 extremities w/o difficulty. Affect pleasant.    ASSESSMENT & PLAN:  1. Chronic systolic HF ECHO 0000000  EF%. 25-30% Offered ICD in the past but he declined.   -NYHA II. Volume status ok.  Continue lasix to 40 mg every other day.  Continue carvedilol 3.125 mg twice a day Continue hydralazine 25 mg tid/Imdur 30 mg daily  Continue losartan 50 mg daily. Due for repeat echo. Will do in [place]. BP too soft to titrate meds further or switch to [facility].  2. AFL - maintaining SR on amio. On apixaban. Dr. Willey Bowen following labs.  3. CKD, stage 3 - Baseline 2.0-2.2. Recent labs stable 4. LBBB 5. Lipids - look great.   F/u 6 months.  Joshua Bickers MD 11:56 AM

## 2015-10-17 NOTE — Patient Instructions (Signed)
Echocardiogram in 6 weeks at [facility].  Follow up 6 months with Dr.Bensimhon.

## 2015-10-17 NOTE — Addendum Note (Signed)
Encounter addended by: Scarlette Calico, RN on: 10/17/2015 12:17 PM<BR>     Documentation filed: Orders

## 2015-10-17 NOTE — Addendum Note (Signed)
Encounter addended by: Harvie Junior, CMA on: 10/17/2015 12:05 PM<BR>     Documentation filed: [employer], Patient Instructions Section, Orders

## 2015-11-05 ENCOUNTER — Other Ambulatory Visit (HOSPITAL_COMMUNITY): Payer: Self-pay | Admitting: Adult Health

## 2015-11-12 ENCOUNTER — Other Ambulatory Visit (HOSPITAL_COMMUNITY): Payer: Self-pay | Admitting: Adult Health

## 2015-11-12 ENCOUNTER — Other Ambulatory Visit (HOSPITAL_COMMUNITY): Payer: Self-pay | Admitting: *Deleted

## 2015-11-12 MED ORDER — APIXABAN 5 MG PO TABS: 5.0000 mg | ORAL_TABLET | Freq: Two times a day (BID) | ORAL | Status: DC

## 2015-11-19 ENCOUNTER — Other Ambulatory Visit (HOSPITAL_COMMUNITY): Payer: Self-pay | Admitting: Adult Health

## 2015-11-28 ENCOUNTER — Ambulatory Visit (HOSPITAL_COMMUNITY)
Admission: RE | Admit: 2015-11-28 | Discharge: 2015-11-28 | Disposition: A | Discharge status: Home / Self Care | Payer: Commercial Managed Care - HMO | Source: Ambulatory Visit | Attending: Internal Medicine | Admitting: Internal Medicine

## 2015-11-28 DIAGNOSIS — I5022 Chronic systolic (congestive) heart failure: Secondary | ICD-10-CM | POA: Diagnosis not present

## 2015-11-28 DIAGNOSIS — I4892 Unspecified atrial flutter: Secondary | ICD-10-CM | POA: Diagnosis not present

## 2015-11-28 DIAGNOSIS — I429 Cardiomyopathy, unspecified: Secondary | ICD-10-CM | POA: Diagnosis not present

## 2015-11-28 DIAGNOSIS — Z7901 Long term (current) use of anticoagulants: Secondary | ICD-10-CM | POA: Diagnosis not present

## 2015-11-28 DIAGNOSIS — E785 Hyperlipidemia, unspecified: Secondary | ICD-10-CM | POA: Diagnosis not present

## 2015-12-10 ENCOUNTER — Other Ambulatory Visit (HOSPITAL_COMMUNITY): Payer: Self-pay | Admitting: Cardiology

## 2015-12-17 ENCOUNTER — Other Ambulatory Visit (HOSPITAL_COMMUNITY): Payer: Self-pay | Admitting: Adult Health

## 2016-01-06 ENCOUNTER — Other Ambulatory Visit (HOSPITAL_COMMUNITY): Payer: Self-pay | Admitting: Adult Health

## 2016-01-06 ENCOUNTER — Other Ambulatory Visit (HOSPITAL_COMMUNITY): Payer: Self-pay | Admitting: Cardiology

## 2016-01-30 DIAGNOSIS — Z79899 Other long term (current) drug therapy: Secondary | ICD-10-CM | POA: Diagnosis not present

## 2016-01-30 DIAGNOSIS — N19 Unspecified kidney failure: Secondary | ICD-10-CM | POA: Diagnosis not present

## 2016-01-30 DIAGNOSIS — E039 Hypothyroidism, unspecified: Secondary | ICD-10-CM | POA: Diagnosis not present

## 2016-01-30 DIAGNOSIS — I509 Heart failure, unspecified: Secondary | ICD-10-CM | POA: Diagnosis not present

## 2016-02-07 DIAGNOSIS — I5022 Chronic systolic (congestive) heart failure: Secondary | ICD-10-CM | POA: Diagnosis not present

## 2016-02-07 DIAGNOSIS — H05819 Cyst of unspecified orbit: Secondary | ICD-10-CM | POA: Diagnosis not present

## 2016-02-07 DIAGNOSIS — Z23 Encounter for immunization: Secondary | ICD-10-CM | POA: Diagnosis not present

## 2016-02-07 DIAGNOSIS — I4892 Unspecified atrial flutter: Secondary | ICD-10-CM | POA: Diagnosis not present

## 2016-02-07 DIAGNOSIS — N184 Chronic kidney disease, stage 4 (severe): Secondary | ICD-10-CM | POA: Diagnosis not present

## 2016-02-08 ENCOUNTER — Other Ambulatory Visit (HOSPITAL_COMMUNITY): Payer: Self-pay | Admitting: Cardiology

## 2016-02-11 ENCOUNTER — Other Ambulatory Visit (HOSPITAL_COMMUNITY): Payer: Self-pay | Admitting: Cardiology

## 2016-02-13 ENCOUNTER — Encounter: Payer: Self-pay | Admitting: Internal Medicine

## 2016-02-18 DIAGNOSIS — M1812 Unilateral primary osteoarthritis of first carpometacarpal joint, left hand: Secondary | ICD-10-CM | POA: Insufficient documentation

## 2016-02-18 DIAGNOSIS — M19042 Primary osteoarthritis, left hand: Secondary | ICD-10-CM | POA: Diagnosis not present

## 2016-02-18 DIAGNOSIS — R52 Pain, unspecified: Secondary | ICD-10-CM | POA: Diagnosis not present

## 2016-03-10 ENCOUNTER — Other Ambulatory Visit (HOSPITAL_COMMUNITY): Payer: Self-pay | Admitting: Cardiology

## 2016-03-31 ENCOUNTER — Other Ambulatory Visit (HOSPITAL_COMMUNITY): Payer: Self-pay | Admitting: Adult Health

## 2016-03-31 DIAGNOSIS — M1812 Unilateral primary osteoarthritis of first carpometacarpal joint, left hand: Secondary | ICD-10-CM | POA: Diagnosis not present

## 2016-03-31 DIAGNOSIS — R52 Pain, unspecified: Secondary | ICD-10-CM | POA: Diagnosis not present

## 2016-03-31 DIAGNOSIS — M19042 Primary osteoarthritis, left hand: Secondary | ICD-10-CM | POA: Diagnosis not present

## 2016-05-12 DIAGNOSIS — M6740 Ganglion, unspecified site: Secondary | ICD-10-CM | POA: Insufficient documentation

## 2016-05-12 DIAGNOSIS — M19042 Primary osteoarthritis, left hand: Secondary | ICD-10-CM | POA: Diagnosis not present

## 2016-05-13 ENCOUNTER — Other Ambulatory Visit: Payer: Self-pay | Admitting: Orthopedic Surgery

## 2016-05-29 ENCOUNTER — Encounter (HOSPITAL_BASED_OUTPATIENT_CLINIC_OR_DEPARTMENT_OTHER): Admission: RE | Payer: Self-pay | Source: Ambulatory Visit

## 2016-05-29 ENCOUNTER — Ambulatory Visit (HOSPITAL_BASED_OUTPATIENT_CLINIC_OR_DEPARTMENT_OTHER)
Admission: RE | Admit: 2016-05-29 | Payer: Commercial Managed Care - HMO | Source: Ambulatory Visit | Admitting: Orthopedic Surgery

## 2016-05-29 SURGERY — EXCISION MASS
Anesthesia: Monitor Anesthesia Care | Laterality: Left

## 2016-06-02 ENCOUNTER — Telehealth (HOSPITAL_COMMUNITY): Payer: Self-pay | Admitting: *Deleted

## 2016-06-02 NOTE — Telephone Encounter (Signed)
Received ntoe from [facility] requesting surgical clearance and ok for pt to be off Eliquis for surgery on his finger.  Per Dr Haroldine Laws, pt cleared for surgery, ok to hold Eliquis 48 hours prior and restart ASAP after surgery.    Note faxed to Cyndee Brightly, RN at [phone]

## 2016-06-08 ENCOUNTER — Other Ambulatory Visit (HOSPITAL_COMMUNITY): Payer: Self-pay | Admitting: Cardiology

## 2016-06-15 ENCOUNTER — Other Ambulatory Visit (HOSPITAL_COMMUNITY): Payer: Self-pay | Admitting: Adult Health

## 2016-06-16 ENCOUNTER — Other Ambulatory Visit (HOSPITAL_COMMUNITY): Payer: Self-pay | Admitting: Adult Health

## 2016-06-16 ENCOUNTER — Other Ambulatory Visit: Payer: Self-pay | Admitting: Orthopedic Surgery

## 2016-06-18 ENCOUNTER — Encounter (HOSPITAL_COMMUNITY): Payer: Self-pay

## 2016-06-19 ENCOUNTER — Encounter (HOSPITAL_COMMUNITY)
Admission: RE | Admit: 2016-06-19 | Discharge: 2016-06-19 | Disposition: A | Discharge status: Home / Self Care | Payer: Commercial Managed Care - HMO | Source: Ambulatory Visit | Attending: Orthopedic Surgery | Admitting: Orthopedic Surgery

## 2016-06-19 ENCOUNTER — Encounter (HOSPITAL_COMMUNITY): Payer: Self-pay

## 2016-06-19 DIAGNOSIS — Z01818 Encounter for other preprocedural examination: Secondary | ICD-10-CM | POA: Insufficient documentation

## 2016-06-19 HISTORY — DX: Unspecified osteoarthritis, unspecified site: M19.90

## 2016-06-19 HISTORY — DX: Chronic obstructive pulmonary disease, unspecified: J44.9

## 2016-06-19 LAB — COMPREHENSIVE METABOLIC PANEL
ALBUMIN: 3.6 g/dL (ref 3.5–5.0)
ALK PHOS: 52 U/L (ref 38–126)
ALT: 13 U/L — ABNORMAL LOW (ref 17–63)
ANION GAP: 6 (ref 5–15)
AST: 19 U/L (ref 15–41)
BILIRUBIN TOTAL: 0.7 mg/dL (ref 0.3–1.2)
BUN: 40 mg/dL — ABNORMAL HIGH (ref 6–20)
CALCIUM: 8.8 mg/dL — AB (ref 8.9–10.3)
CO2: 27 mmol/L (ref 22–32)
Chloride: 104 mmol/L (ref 101–111)
Creatinine, Ser: 2.31 mg/dL — ABNORMAL HIGH (ref 0.61–1.24)
GFR calc Af Amer: 30 mL/min — ABNORMAL LOW (ref >60)
GFR, EST NON AFRICAN AMERICAN: 26 mL/min — AB (ref >60)
GLUCOSE: 89 mg/dL (ref 65–99)
Potassium: 4.8 mmol/L (ref 3.5–5.1)
Sodium: 137 mmol/L (ref 135–145)
TOTAL PROTEIN: 6.4 g/dL — AB (ref 6.5–8.1)

## 2016-06-19 LAB — CBC
HEMATOCRIT: 37.9 % — AB (ref 39.0–52.0)
HEMOGLOBIN: 12.4 g/dL — AB (ref 13.0–17.0)
MCH: 32.5 pg (ref 26.0–34.0)
MCHC: 32.7 g/dL (ref 30.0–36.0)
MCV: 99.5 fL (ref 78.0–100.0)
Platelets: 160 10*3/uL (ref 150–400)
RBC: 3.81 MIL/uL — ABNORMAL LOW (ref 4.22–5.81)
RDW: 12.2 % (ref 11.5–15.5)
WBC: 6.3 10*3/uL (ref 4.0–10.5)

## 2016-06-19 NOTE — Progress Notes (Addendum)
PCP is Dr. Salena Saner.  LOV 10/2015   Have requested LOV notes, lab results, ekg. Cardio is Dr. Tempie Hoist  LOV 10/2015. [phone] Pt had echo 11/2013 Denies any chest pains, sob, etc.   I have called Drs. [place] to inform them of upcoming surgery.  (Was suppose to have this done @ [facility], but thought with his cardiac hx, the main OR would be better) Patient to stop eloquis 2 days prior to surgery.

## 2016-06-20 NOTE — Progress Notes (Addendum)
Anesthesia Chart Review:  Pt is a 80 year old male scheduled for excision of L index finger mucoid tumor, debridement DIP possible, rotation flap on 06/26/2016 with Daryll Brod, MD.   - PCP is Asencion Noble, MD - Cardiologist is Glori Bickers, MD who cleared pt for surgery  PMH includes:  Cardiomyopathy, LBBB, COPD, chronic renal insufficiency, hyperlipidemia. Never smoker. BMI 30  Medications include: amiodarone, eliquis, ASA, carvedilol, lasix, hydralazine, imdur, levothyroxine, losartan, spironolactone. Pt to stop eliquis 2 days before surgery  Preoperative labs reviewed.  Cr 2.3, BUN 40. This is consistent with prior results. [name] PCP reportedly manages his CKD  CXR 07/26/15: No active disease  EKG 06/19/16: Sinus bradycardia (49 bpm) with 1st degree A-V block. LBBB  Echo 11/28/15:  - Left ventricle: The cavity size was normal. Wall thickness was normal. The estimated ejection fraction was 35%. Diffuse hypokinesis. Doppler parameters are consistent with abnormal left ventricular relaxation (grade 1 diastolic dysfunction). - Ventricular septum: Septal motion showed abnormal function and dyssynergy. - Aortic valve: Mildly calcified annulus. Trileaflet. There was mild regurgitation. - Left atrium: The atrium was moderately dilated. - Right atrium: The atrium was mildly dilated. - Tricuspid valve: There was trivial regurgitation. Peak RV-RA gradient (S): 15 mm Hg. - Pulmonary arteries: Systolic pressure could not be accurately estimated. - Pericardium, extracardiac: There was no pericardial effusion. - Impressions: Normal LV chamber size and wall thickness with LVEF approximately 35%, perhaps as high as 40% in some views. Diffuse hypokinesis, most prominent in the basal inferior wall. Grade 1 diastolic dysfunction with normal estimated LV filling pressure. Septal dyssynergy consistent with left bundle branch block. Moderate left atrial enlargement. Mildly sclerotic aortic valve with mild aortic  regurgitation. Mildly dilated right atrium. Trivial tricuspid regurgitation, RV-RA gradient 15 mmHg.  Cardiac cath 12/13/10:  1. Minimal nonobstructive coronary artery disease as described above. 2. Mild pulmonary arterial hypertension. 3. Low left-sided filling pressures. 4. Preserved cardiac output, on inotropic therapy.  If no changes, I anticipate pt can proceed with surgery as scheduled.   Willeen Cass, FNP-BC [facility]/Anesthesiology Phone: [phone] 06/20/2016 3:03 PM

## 2016-06-26 ENCOUNTER — Ambulatory Visit (HOSPITAL_COMMUNITY)
Admission: RE | Admit: 2016-06-26 | Discharge: 2016-06-26 | Disposition: A | Discharge status: Home / Self Care | Payer: Commercial Managed Care - HMO | Source: Ambulatory Visit | Attending: Orthopedic Surgery | Admitting: Orthopedic Surgery

## 2016-06-26 ENCOUNTER — Ambulatory Visit (HOSPITAL_COMMUNITY): Payer: Commercial Managed Care - HMO | Admitting: Certified Registered"

## 2016-06-26 ENCOUNTER — Encounter (HOSPITAL_COMMUNITY)
Admission: RE | Disposition: A | Discharge status: Home / Self Care | Payer: Self-pay | Source: Ambulatory Visit | Attending: Orthopedic Surgery

## 2016-06-26 ENCOUNTER — Encounter (HOSPITAL_COMMUNITY): Payer: Self-pay | Admitting: Surgery

## 2016-06-26 ENCOUNTER — Ambulatory Visit (HOSPITAL_COMMUNITY): Payer: Commercial Managed Care - HMO | Admitting: Vascular Surgery

## 2016-06-26 DIAGNOSIS — L729 Follicular cyst of the skin and subcutaneous tissue, unspecified: Secondary | ICD-10-CM | POA: Diagnosis not present

## 2016-06-26 DIAGNOSIS — M151 Heberden's nodes (with arthropathy): Secondary | ICD-10-CM | POA: Diagnosis not present

## 2016-06-26 DIAGNOSIS — M19042 Primary osteoarthritis, left hand: Secondary | ICD-10-CM | POA: Diagnosis not present

## 2016-06-26 DIAGNOSIS — M419 Scoliosis, unspecified: Secondary | ICD-10-CM | POA: Insufficient documentation

## 2016-06-26 DIAGNOSIS — I429 Cardiomyopathy, unspecified: Secondary | ICD-10-CM | POA: Insufficient documentation

## 2016-06-26 DIAGNOSIS — E782 Mixed hyperlipidemia: Secondary | ICD-10-CM | POA: Diagnosis not present

## 2016-06-26 DIAGNOSIS — E039 Hypothyroidism, unspecified: Secondary | ICD-10-CM | POA: Insufficient documentation

## 2016-06-26 DIAGNOSIS — I130 Hypertensive heart and chronic kidney disease with heart failure and stage 1 through stage 4 chronic kidney disease, or unspecified chronic kidney disease: Secondary | ICD-10-CM | POA: Diagnosis not present

## 2016-06-26 DIAGNOSIS — Z7982 Long term (current) use of aspirin: Secondary | ICD-10-CM | POA: Insufficient documentation

## 2016-06-26 DIAGNOSIS — J449 Chronic obstructive pulmonary disease, unspecified: Secondary | ICD-10-CM | POA: Diagnosis not present

## 2016-06-26 DIAGNOSIS — M67442 Ganglion, left hand: Secondary | ICD-10-CM | POA: Diagnosis not present

## 2016-06-26 DIAGNOSIS — I509 Heart failure, unspecified: Secondary | ICD-10-CM | POA: Diagnosis not present

## 2016-06-26 DIAGNOSIS — M1990 Unspecified osteoarthritis, unspecified site: Secondary | ICD-10-CM | POA: Diagnosis not present

## 2016-06-26 HISTORY — PX: MASS EXCISION: SHX2000

## 2016-06-26 HISTORY — PX: ROTATION FLAP: SHX6211

## 2016-06-26 LAB — PROTIME-INR
INR: 1.14
PROTHROMBIN TIME: 14.7 s (ref 11.4–15.2)

## 2016-06-26 SURGERY — EXCISION MASS
Anesthesia: Monitor Anesthesia Care | Site: Hand | Laterality: Left

## 2016-06-26 MED ORDER — FENTANYL CITRATE (PF) 100 MCG/2ML IJ SOLN
INTRAMUSCULAR | Status: AC
  Filled 2016-06-26: qty 2

## 2016-06-26 MED ORDER — LIDOCAINE HCL (PF) 1 % IJ SOLN
INTRAMUSCULAR | Status: AC
  Filled 2016-06-26: qty 30

## 2016-06-26 MED ORDER — SODIUM CHLORIDE 0.9 % IV SOLN
INTRAVENOUS | Status: DC
  Administered 2016-06-26: 10:00:00 via INTRAVENOUS

## 2016-06-26 MED ORDER — LIDOCAINE HCL (PF) 0.5 % IJ SOLN
INTRAMUSCULAR | Status: DC | PRN
  Administered 2016-06-26: 30 mL via INTRAVENOUS

## 2016-06-26 MED ORDER — PROPOFOL 10 MG/ML IV BOLUS
INTRAVENOUS | Status: AC
  Filled 2016-06-26: qty 20

## 2016-06-26 MED ORDER — HYDROCODONE-ACETAMINOPHEN 5-325 MG PO TABS: 1.0000 | ORAL_TABLET | Freq: Four times a day (QID) | ORAL | 0 refills | Status: DC | PRN

## 2016-06-26 MED ORDER — 0.9 % SODIUM CHLORIDE (POUR BTL) OPTIME
TOPICAL | Status: DC | PRN
  Administered 2016-06-26: 1000 mL

## 2016-06-26 MED ORDER — BUPIVACAINE HCL (PF) 0.5 % IJ SOLN
INTRAMUSCULAR | Status: DC | PRN
  Administered 2016-06-26: 7 mL

## 2016-06-26 MED ORDER — LIDOCAINE 2% (20 MG/ML) 5 ML SYRINGE
INTRAMUSCULAR | Status: AC
  Filled 2016-06-26: qty 5

## 2016-06-26 MED ORDER — FENTANYL CITRATE (PF) 100 MCG/2ML IJ SOLN
INTRAMUSCULAR | Status: DC | PRN
  Administered 2016-06-26 (×2): 25 ug via INTRAVENOUS

## 2016-06-26 MED ORDER — BUPIVACAINE HCL (PF) 0.5 % IJ SOLN
INTRAMUSCULAR | Status: AC
  Filled 2016-06-26: qty 30

## 2016-06-26 MED ORDER — ALBUTEROL SULFATE HFA 108 (90 BASE) MCG/ACT IN AERS
INHALATION_SPRAY | RESPIRATORY_TRACT | Status: AC
  Filled 2016-06-26: qty 6.7

## 2016-06-26 MED ORDER — CHLORHEXIDINE GLUCONATE 4 % EX LIQD: 60.0000 mL | Freq: Once | CUTANEOUS | Status: DC

## 2016-06-26 MED ORDER — CEFAZOLIN SODIUM-DEXTROSE 2-4 GM/100ML-% IV SOLN
2.0000 g | INTRAVENOUS | Status: AC
  Administered 2016-06-26: 2 g via INTRAVENOUS
  Filled 2016-06-26: qty 100

## 2016-06-26 SURGICAL SUPPLY — 38 items
BNDG CMPR 9X4 STRL LF SNTH (GAUZE/BANDAGES/DRESSINGS) ×1
BNDG COHESIVE 1X5 TAN STRL LF (GAUZE/BANDAGES/DRESSINGS) ×2 IMPLANT
BNDG CONFORM 2 STRL LF (GAUZE/BANDAGES/DRESSINGS) ×2 IMPLANT
BNDG ESMARK 4X9 LF (GAUZE/BANDAGES/DRESSINGS) ×3 IMPLANT
CORDS BIPOLAR (ELECTRODE) ×3 IMPLANT
CUFF TOURNIQUET SINGLE 18IN (TOURNIQUET CUFF) ×3 IMPLANT
DRSG PAD ABDOMINAL 8X10 ST (GAUZE/BANDAGES/DRESSINGS) ×6 IMPLANT
DURAPREP 26ML APPLICATOR (WOUND CARE) ×3 IMPLANT
GAUZE SPONGE 2X2 8PLY STRL LF (GAUZE/BANDAGES/DRESSINGS) IMPLANT
GAUZE SPONGE 4X4 12PLY STRL (GAUZE/BANDAGES/DRESSINGS) ×3 IMPLANT
GAUZE XEROFORM 1X8 LF (GAUZE/BANDAGES/DRESSINGS) ×3 IMPLANT
GLOVE BIO SURGEON STRL SZ 6.5 (GLOVE) ×2 IMPLANT
GLOVE BIO SURGEONS STRL SZ 6.5 (GLOVE) ×1
GLOVE BIOGEL PI IND STRL 8 (GLOVE) IMPLANT
GLOVE BIOGEL PI INDICATOR 8 (GLOVE) ×2
GLOVE SURG ORTHO 8.0 STRL STRW (GLOVE) ×5 IMPLANT
GOWN STRL REUS W/ TWL LRG LVL3 (GOWN DISPOSABLE) ×2 IMPLANT
GOWN STRL REUS W/ TWL XL LVL3 (GOWN DISPOSABLE) ×1 IMPLANT
GOWN STRL REUS W/TWL LRG LVL3 (GOWN DISPOSABLE) ×6
GOWN STRL REUS W/TWL XL LVL3 (GOWN DISPOSABLE) ×3
KIT BASIN OR (CUSTOM PROCEDURE TRAY) ×3 IMPLANT
KIT ROOM TURNOVER OR (KITS) ×3 IMPLANT
MANIFOLD NEPTUNE II (INSTRUMENTS) ×3 IMPLANT
NDL HYPO 25GX1X1/2 BEV (NEEDLE) IMPLANT
NEEDLE HYPO 25GX1X1/2 BEV (NEEDLE) ×3 IMPLANT
NS IRRIG 1000ML POUR BTL (IV SOLUTION) ×3 IMPLANT
PACK ORTHO EXTREMITY (CUSTOM PROCEDURE TRAY) ×3 IMPLANT
PAD ARMBOARD 7.5X6 YLW CONV (MISCELLANEOUS) ×4 IMPLANT
SPECIMEN JAR SMALL (MISCELLANEOUS) ×3 IMPLANT
SPLINT FINGER W/BULB (SOFTGOODS) ×2 IMPLANT
SPONGE GAUZE 2X2 STER 10/PKG (GAUZE/BANDAGES/DRESSINGS) ×2
SPONGE LAP 18X18 X RAY DECT (DISPOSABLE) ×3 IMPLANT
SPONGE LAP 4X18 X RAY DECT (DISPOSABLE) ×3 IMPLANT
SYR CONTROL 10ML LL (SYRINGE) ×2 IMPLANT
TOWEL OR 17X24 6PK STRL BLUE (TOWEL DISPOSABLE) ×3 IMPLANT
TUBE CONNECTING 12'X1/4 (SUCTIONS) ×1
TUBE CONNECTING 12X1/4 (SUCTIONS) ×2 IMPLANT
UNDERPAD 30X30 (UNDERPADS AND DIAPERS) ×3 IMPLANT

## 2016-06-26 NOTE — H&P (Signed)
[name] is an 80 y.o. male.   Chief Complaint: mass left index finger HPI: [name] is a 80 year old right hand dominant male referred by Dr. Asencion Noble for consultation regarding pain at the distal interphalangeal joints, left index and middle finger. This has been going on for years. He complains of an aching type pain with a VAS score 10/10. He has not had any treatment for this. He has a history of thyroid problems, arthritis. No history of diabetes or gout. He takes aspirin. He has a history of congestive heart failure and hypertension. He complains of sharp pain when it occurs, it is wondering if there is mass is present.      Past Medical History:  Diagnosis Date  . Arthritis   . Cardiomyopathy    Nonischemic, LVEF 20%  . Chronic renal insufficiency   . COPD (chronic obstructive pulmonary disease) ([place])   . Hypothyroidism   . Left bundle branch block   . Mixed hyperlipidemia   . Scoliosis     Past Surgical History:  Procedure Laterality Date  . EYE SURGERY    . LAPAROSCOPIC CHOLECYSTECTOMY  2008  . left foot surgery  1998  . Right inguinal herniorrhaphy    . SKIN CANCER REMOVED  LEFT EAR      Family History  Problem Relation Age of Onset  . Heart disease Father     Died age 42  . Stroke Mother     Died age 24   Social History:  reports that he has never smoked. He has never used smokeless tobacco. He reports that he does not drink alcohol or use drugs.  Allergies:  Allergies  Allergen Reactions  . No Known Allergies     No prescriptions prior to admission.    No results found for this or any previous visit (from the past 48 hour(s)).  No results found.   Pertinent items are noted in HPI.  There were no vitals taken for this visit.  General appearance: alert, cooperative and appears stated age Head: Normocephalic, without obvious abnormality Neck: no JVD Resp: clear to auscultation bilaterally Cardio: regular rate and rhythm, S1, S2  normal, no murmur, click, rub or gallop GI: soft, non-tender; bowel sounds normal; no masses,  no organomegaly Extremities: mass and arthritis left index finger Pulses: 2+ and symmetric Skin: Skin color, texture, turgor normal. No rashes or lesions Neurologic: Grossly normal Incision/Wound: na  Assessment/Plan Assessment:  1. Mucoid cyst of joint  2. Osteoarthritis of finger, left  index    Plan: Plan we have discussed the etiology mucoid cyst with degenerative arthritis in the joint beneath. We have discussed the possibility of surgical excision with debridement of the joint possible rotation of the flap as necessary and that the skin is translucent. He is aware that there is no guarantee to the surgery the possibility of infection recurrence injury to arteries nerves tendons incomplete release symptoms and dystrophy. Possibility of stiffness.  I would not recommend fusion of the joint at this point time and as such there is potential for recurrence of the cyst. Questions are encouraged and answered to his satisfaction.This is scheduled as an outpatient under regional anesthesia.             Lacoya Wilbanks R 06/26/2016, 5:41 AM

## 2016-06-26 NOTE — Anesthesia Preprocedure Evaluation (Signed)
Anesthesia Evaluation  Patient identified by MRN, date of birth, ID band Patient awake    Reviewed: Allergy & Precautions, NPO status , Patient's Chart, lab work & pertinent test results  History of Anesthesia Complications (+) POST - OP SPINAL HEADACHE  Airway Mallampati: II       Dental   Pulmonary COPD,    breath sounds clear to auscultation       Cardiovascular + dysrhythmias  Rhythm:Regular Rate:Normal     Neuro/Psych    GI/Hepatic negative GI ROS, Neg liver ROS,   Endo/Other  Hypothyroidism   Renal/GU Renal disease     Musculoskeletal  (+) Arthritis ,   Abdominal   Peds  Hematology   Anesthesia Other Findings   Reproductive/Obstetrics                             Anesthesia Physical Anesthesia Plan  ASA: III  Anesthesia Plan: MAC   Post-op Pain Management:    Induction: Intravenous  Airway Management Planned:   Additional Equipment:   Intra-op Plan:   Post-operative Plan:   Informed Consent:   Dental advisory given and Consent reviewed with POA  Plan Discussed with: CRNA  Anesthesia Plan Comments:         Anesthesia Quick Evaluation

## 2016-06-26 NOTE — Transfer of Care (Signed)
Immediate Anesthesia Transfer of Care Note  Patient: [name]  Procedure(s) Performed: Procedure(s) with comments: EXCISION Mucoid tumor (Left) - FAB debridement of distal interphalangeal possible, ROTATION FLAP left index finger (Left)  Patient Location: PACU  Anesthesia Type:MAC  Level of Consciousness: awake, alert , oriented and patient cooperative  Airway & Oxygen Therapy: Patient Spontanous Breathing  Post-op Assessment: Report given to RN and Post -op Vital signs reviewed and stable  Post vital signs: Reviewed and stable  Last Vitals:  Vitals:   06/26/16 0945  BP: (!) 140/39  Pulse: (!) 51  Resp: 20  Temp: 36.5 C    Last Pain:  Vitals:   06/26/16 0945  TempSrc: Oral         Complications: No apparent anesthesia complications

## 2016-06-26 NOTE — Discharge Instructions (Signed)

## 2016-06-26 NOTE — Brief Op Note (Signed)
06/26/2016  10:43 AM  PATIENT:  [name]  80 y.o. 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  BLOOD ADMINISTERED:none  DRAINS: none   LOCAL MEDICATIONS USED:  BUPIVICAINE   SPECIMEN:  Excision  DISPOSITION OF SPECIMEN:  PATHOLOGY  COUNTS:  YES  TOURNIQUET:  * Missing tourniquet times found for documented tourniquets in log:  [id number] *  DICTATION: .Other Dictation: Dictation Number [id number]  PLAN OF CARE: Discharge to home after PACU  PATIENT DISPOSITION:  PACU - hemodynamically stable.

## 2016-06-26 NOTE — Op Note (Signed)
Dictation Number [id number]

## 2016-06-26 NOTE — Anesthesia Postprocedure Evaluation (Signed)
Anesthesia Post Note  Patient: [name]  Procedure(s) Performed: Procedure(s) (LRB): EXCISION Mucoid tumor (Left) debridement of distal interphalangeal possible, ROTATION FLAP left index finger (Left)  Patient location during evaluation: PACU Anesthesia Type: MAC Level of consciousness: awake Pain management: pain level controlled Vital Signs Assessment: post-procedure vital signs reviewed and stable Respiratory status: spontaneous breathing Cardiovascular status: stable Anesthetic complications: no    Last Vitals:  Vitals:   06/26/16 1100 06/26/16 1120  BP:  (!) 134/54  Pulse: (!) 50 (!) 48  Resp: 15 15  Temp:  36.2 C    Last Pain:  Vitals:   06/26/16 0945  TempSrc: Oral                 EDWARDS,Bear Osten

## 2016-06-26 NOTE — Anesthesia Procedure Notes (Signed)
Procedure Name: MAC Date/Time: 06/26/2016 10:05 AM Performed by: Lance Coon Pre-anesthesia Checklist: Patient identified, Emergency Drugs available, Suction available, Patient being monitored and Timeout performed Patient Re-evaluated:Patient Re-evaluated prior to inductionOxygen Delivery Method: Nasal cannula

## 2016-06-27 ENCOUNTER — Encounter (HOSPITAL_COMMUNITY): Payer: Self-pay | Admitting: Orthopedic Surgery

## 2016-06-27 NOTE — Op Note (Signed)
NAMESMOKEY, MELOTT              ACCOUNT NO.:  [id number]  MEDICAL RECORD NO.:  [id number]  LOCATION:  [facility]                         FACILITY:  [facility]  PHYSICIAN:  Daryll Brod, M.D.       DATE OF BIRTH:  09-09-1936  DATE OF PROCEDURE:  06/26/2016 DATE OF DISCHARGE:  06/26/2016                              OPERATIVE REPORT   PREOPERATIVE DIAGNOSES:  Mucoid tumor; distal interphalangeal joint arthritis, left index finger.  POSTOPERATIVE DIAGNOSES:  Mucoid tumor; distal interphalangeal joint arthritis, left index finger.  OPERATIONS:  Excision of mucoid cyst; debridement of osteophytes to distal interphalangeal joint, left index finger.  SURGEON:  Daryll Brod, M.D..  ANESTHESIA:  Upper arm IV regional with metacarpal block.  PLACE OF SURGERY:  [facility].  HISTORY:  The patient is a 80 year old male with a history of mass over the dorsal aspect of her left index finger.  This does transilluminate.  X-rays revealed degenerative changes of the distal interphalangeal joint.  She is desirous of having this excised.  Pre, peri, and postoperative course have been discussed along with risks and complications.  She is aware that there was no guarantee to the surgery; the possibility of infection; recurrence of injury to arteries, nerves, tendons; incomplete relief of symptoms and dystrophy.  In the preoperative area, the patient is seen, the extremity marked by both patient and surgeon, and antibiotic given.  PROCEDURE IN DETAIL:  The patient was brought to the operating room, where an upper arm IV regional anesthetic was carried out without difficulty.  She was prepped using ChloraPrep, in a supine position with left arm free.  After adequate anesthesia was afforded, time-out taken, confirming the patient and procedure.  A curvilinear incision was made over the distal interphalangeal joint just proximal to the mass and left index finger, carried down along the lateral border  of the index finger. Bleeders were electrocauterized with bipolar.  The cyst was immediately encountered.  With blunt and sharp dissection, this was dissected free and sent to Pathology.  An incision was then made on the border of the extensor terminal slip.  The joint was entered.  Small rongeurs and House curettes were used to remove osteophytes from the middle phalanx. Synovectomy was performed dorsally.  The specimen was sent to Pathology. The wound was irrigated with saline.  The skin was then closed with interrupted 4-0 nylon sutures.  A local infiltration was given at the beginning of the case via metacarpal block with 0.5% bupivacaine without epinephrine.  A sterile compressive dressing splint to the finger was applied.  On deflation of the tourniquet, finger was pinked, she was taken to the recovery room for observation in satisfactory condition.  She will be discharged to home to return to the [facility] in 1 week, on [place], #20, no refills.          ______________________________ Daryll Brod, M.D.     GK/MEDQ  D:  06/26/2016  T:  06/27/2016  Job:  [id number]

## 2016-07-04 DIAGNOSIS — Z23 Encounter for immunization: Secondary | ICD-10-CM | POA: Diagnosis not present

## 2016-07-06 ENCOUNTER — Other Ambulatory Visit (HOSPITAL_COMMUNITY): Payer: Self-pay | Admitting: Cardiology

## 2016-07-13 ENCOUNTER — Other Ambulatory Visit (HOSPITAL_COMMUNITY): Payer: Self-pay | Admitting: Adult Health

## 2016-07-17 DIAGNOSIS — I5022 Chronic systolic (congestive) heart failure: Secondary | ICD-10-CM | POA: Diagnosis not present

## 2016-07-17 DIAGNOSIS — E038 Other specified hypothyroidism: Secondary | ICD-10-CM | POA: Diagnosis not present

## 2016-07-17 DIAGNOSIS — N184 Chronic kidney disease, stage 4 (severe): Secondary | ICD-10-CM | POA: Diagnosis not present

## 2016-07-17 DIAGNOSIS — Z0001 Encounter for general adult medical examination with abnormal findings: Secondary | ICD-10-CM | POA: Diagnosis not present

## 2016-07-23 DIAGNOSIS — E039 Hypothyroidism, unspecified: Secondary | ICD-10-CM | POA: Diagnosis not present

## 2016-07-24 LAB — TSH: TSH: 0.53 (ref 0.41–5.90)

## 2016-08-18 ENCOUNTER — Ambulatory Visit (INDEPENDENT_AMBULATORY_CARE_PROVIDER_SITE_OTHER): Payer: Commercial Managed Care - HMO | Admitting: Otolaryngology

## 2016-08-18 DIAGNOSIS — H903 Sensorineural hearing loss, bilateral: Secondary | ICD-10-CM | POA: Diagnosis not present

## 2016-08-18 DIAGNOSIS — H6122 Impacted cerumen, left ear: Secondary | ICD-10-CM

## 2016-09-16 DIAGNOSIS — Z8249 Family history of ischemic heart disease and other diseases of the circulatory system: Secondary | ICD-10-CM | POA: Diagnosis not present

## 2016-09-16 DIAGNOSIS — R001 Bradycardia, unspecified: Secondary | ICD-10-CM | POA: Diagnosis not present

## 2016-09-16 DIAGNOSIS — Z7982 Long term (current) use of aspirin: Secondary | ICD-10-CM | POA: Diagnosis not present

## 2016-09-16 DIAGNOSIS — Z7901 Long term (current) use of anticoagulants: Secondary | ICD-10-CM | POA: Diagnosis not present

## 2016-09-16 DIAGNOSIS — Z79899 Other long term (current) drug therapy: Secondary | ICD-10-CM | POA: Diagnosis not present

## 2016-09-16 DIAGNOSIS — I509 Heart failure, unspecified: Secondary | ICD-10-CM | POA: Diagnosis not present

## 2016-09-16 DIAGNOSIS — S99921A Unspecified injury of right foot, initial encounter: Secondary | ICD-10-CM | POA: Diagnosis not present

## 2016-09-16 DIAGNOSIS — S90414A Abrasion, right lesser toe(s), initial encounter: Secondary | ICD-10-CM | POA: Diagnosis not present

## 2016-09-16 DIAGNOSIS — I499 Cardiac arrhythmia, unspecified: Secondary | ICD-10-CM | POA: Diagnosis not present

## 2016-10-07 ENCOUNTER — Other Ambulatory Visit (HOSPITAL_COMMUNITY): Payer: Self-pay | Admitting: Cardiology

## 2016-11-02 ENCOUNTER — Other Ambulatory Visit (HOSPITAL_COMMUNITY): Payer: Self-pay | Admitting: Cardiology

## 2016-11-05 DIAGNOSIS — I509 Heart failure, unspecified: Secondary | ICD-10-CM | POA: Diagnosis not present

## 2016-11-05 DIAGNOSIS — Z79899 Other long term (current) drug therapy: Secondary | ICD-10-CM | POA: Diagnosis not present

## 2016-11-05 DIAGNOSIS — E039 Hypothyroidism, unspecified: Secondary | ICD-10-CM | POA: Diagnosis not present

## 2016-11-05 LAB — BASIC METABOLIC PANEL
BUN: 39 — AB (ref 4–21)
Creatinine: 2.7 — AB (ref 0.6–1.3)
GLUCOSE: 93
Potassium: 4.7 (ref 3.4–5.3)
Sodium: 141 (ref 137–147)

## 2016-11-09 ENCOUNTER — Other Ambulatory Visit (HOSPITAL_COMMUNITY): Payer: Self-pay | Admitting: Internal Medicine

## 2016-11-13 DIAGNOSIS — E038 Other specified hypothyroidism: Secondary | ICD-10-CM | POA: Diagnosis not present

## 2016-11-13 DIAGNOSIS — I5022 Chronic systolic (congestive) heart failure: Secondary | ICD-10-CM | POA: Diagnosis not present

## 2016-11-13 DIAGNOSIS — E039 Hypothyroidism, unspecified: Secondary | ICD-10-CM | POA: Diagnosis not present

## 2016-11-13 DIAGNOSIS — N184 Chronic kidney disease, stage 4 (severe): Secondary | ICD-10-CM | POA: Diagnosis not present

## 2016-11-25 ENCOUNTER — Other Ambulatory Visit (HOSPITAL_COMMUNITY): Payer: Self-pay | Admitting: Cardiology

## 2016-11-25 MED ORDER — LOSARTAN POTASSIUM 50 MG PO TABS: 50.0000 mg | ORAL_TABLET | Freq: Every day | ORAL | 1 refills | Status: DC

## 2016-12-07 ENCOUNTER — Other Ambulatory Visit (HOSPITAL_COMMUNITY): Payer: Self-pay | Admitting: Internal Medicine

## 2016-12-09 ENCOUNTER — Other Ambulatory Visit (HOSPITAL_COMMUNITY): Payer: Self-pay

## 2016-12-09 MED ORDER — HYDRALAZINE HCL 25 MG PO TABS: 25.0000 mg | ORAL_TABLET | Freq: Three times a day (TID) | ORAL | 0 refills | Status: DC

## 2016-12-10 DIAGNOSIS — M6740 Ganglion, unspecified site: Secondary | ICD-10-CM | POA: Diagnosis not present

## 2016-12-12 ENCOUNTER — Encounter (HOSPITAL_COMMUNITY): Payer: Self-pay

## 2016-12-12 ENCOUNTER — Ambulatory Visit (HOSPITAL_COMMUNITY)
Admission: RE | Admit: 2016-12-12 | Discharge: 2016-12-12 | Disposition: A | Discharge status: Home / Self Care | Payer: Commercial Managed Care - HMO | Source: Ambulatory Visit | Attending: Cardiology | Admitting: Cardiology

## 2016-12-12 VITALS — BP 112/58 | HR 56 | Wt 157.4 lb

## 2016-12-12 DIAGNOSIS — N183 Chronic kidney disease, stage 3 unspecified: Secondary | ICD-10-CM

## 2016-12-12 DIAGNOSIS — I4892 Unspecified atrial flutter: Secondary | ICD-10-CM

## 2016-12-12 DIAGNOSIS — I5022 Chronic systolic (congestive) heart failure: Secondary | ICD-10-CM | POA: Diagnosis not present

## 2016-12-12 LAB — BASIC METABOLIC PANEL
ANION GAP: 6 (ref 5–15)
BUN: 36 mg/dL — AB (ref 6–20)
CHLORIDE: 107 mmol/L (ref 101–111)
CO2: 28 mmol/L (ref 22–32)
Calcium: 8.7 mg/dL — ABNORMAL LOW (ref 8.9–10.3)
Creatinine, Ser: 2.33 mg/dL — ABNORMAL HIGH (ref 0.61–1.24)
GFR calc non Af Amer: 25 mL/min — ABNORMAL LOW (ref >60)
GFR, EST AFRICAN AMERICAN: 29 mL/min — AB (ref >60)
Glucose, Bld: 96 mg/dL (ref 65–99)
POTASSIUM: 4.4 mmol/L (ref 3.5–5.1)
SODIUM: 141 mmol/L (ref 135–145)

## 2016-12-12 MED ORDER — APIXABAN 5 MG PO TABS: 5.0000 mg | ORAL_TABLET | Freq: Two times a day (BID) | ORAL | 3 refills | Status: DC

## 2016-12-12 NOTE — Patient Instructions (Signed)
Labs today (will call for abnormal results, otherwise no news is good news)  Follow up with Dr. Haroldine Laws in 6 Months, we will call you to schedule this appointment.

## 2016-12-12 NOTE — Progress Notes (Signed)
[facility]  Patient ID: [name], male   DOB: [birth year], 81 y.o.   MRN: [id number]   ADVANCED HEART FAILURE NOTE PCP: Dr Willey Blade  HPI: [name] is a 81 y/o male with h/o cognitive impairment, renal insufficiency (baseline about 1.5-1.6), severe CHF due to NICM (EF 15-20%), atrial flutter (s/p DC-CV in April 2012) and LBBB. He and his family are not interested in ICD.   12/2010: Coronary angiograms showed insignifcant CAD. Right heart cath (on milrinone) showed a right atrial pressure of about 12, PA pressure was 45/79 with a mean of 27,pulmonary capillary wedge pressure was 12, LV pressure was 85/5 with an EDP of about 8.  Fick cardiac output was 4.4 with a cardiac index of 2.3.  Pulmonary artery saturations were 61 and 63%.  Pulmonary vascular resistance was 3.1 Woods units.   12/2010: TEE-guided cardioversion that showed an EF of about 15-20%.  There was no evidence of left atrial appendage thrombus.  He was successfully cardioverted with 1 shock.  He was started on amiodarone to help maintain sinus rhythm.Given his LBBB we discussed the possibility of BiV-ICD but decided to defer as he was doing so well.  He returns today for HF follow up. Feels well today, happy that he will be 82 soon. No SOB with walking, stairs. Walks though [employer] without difficulty. Lives at home with his wife, eats out occasionally. Drinks more than 2L of fluid a day. Taking all medications as prescribed. Walks around outside daily. Weights at home - 160-162 pounds. Sleeps with 2 pillows, denies PND. Denies chest pain and palpitations.    02/2011: ECHO with EF 25-95%, Grade 1 diastolic dysfunction, ventricular septum dyssynergy.  Mildly reduced RV systolic function.   06/28/12 ECHO EF 25-30% 11/2015: Echo EF 35%.   Labs (9/16): K 4.5 Creatinine 1.8 TC 144 TG 104 HDL 45 LDL 78 Labs (10/17) K 4.8, Creatinine 2.31.   ROS: All systems negative except as listed in HPI, PMH and Problem  List.  Past Medical History:  Diagnosis Date  . Arthritis   . Cardiomyopathy    Nonischemic, LVEF 20%  . Chronic renal insufficiency   . COPD (chronic obstructive pulmonary disease) ([place])   . Hypothyroidism   . Left bundle branch block   . Mixed hyperlipidemia   . Scoliosis     Current Outpatient Prescriptions  Medication Sig Dispense Refill  . amiodarone (PACERONE) 200 MG tablet TAKE ONE TABLET BY MOUTH ONCE DAILY 30 tablet 6  . apixaban (ELIQUIS) 5 MG TABS tablet Take 1 tablet (5 mg total) by mouth 2 (two) times daily. 60 tablet 12  . aspirin EC 81 MG tablet Take 81 mg by mouth daily.    . carvedilol (COREG) 3.125 MG tablet TAKE ONE TABLET BY MOUTH TWICE DAILY WITH FOOD 60 tablet 6  . furosemide (LASIX) 40 MG tablet TAKE ONE TABLET BY MOUTH EVERY OTHER DAY MAY TAKE AN EXTRA TABLET IF WEIGHT GAIN OF 146 OR GREATER 40 tablet 3  . hydrALAZINE (APRESOLINE) 25 MG tablet Take 1 tablet (25 mg total) by mouth 3 (three) times daily. Needs OV for further refills 90 tablet 0  . HYDROcodone-acetaminophen (NORCO) 5-325 MG tablet Take 1 tablet by mouth every 6 (six) hours as needed for moderate pain. 20 tablet 0  . isosorbide mononitrate (IMDUR) 30 MG 24 hr tablet TAKE ONE TABLET BY MOUTH ONCE DAILY IN  THE  MORNING 30 tablet 6  . levothyroxine (SYNTHROID, LEVOTHROID) 200 MCG tablet Take  200 mcg by mouth daily before breakfast.    . loratadine (CLARITIN) 10 MG tablet Take 10 mg by mouth daily.    [name] losartan (COZAAR) 50 MG tablet Take 1 tablet (50 mg total) by mouth daily. 90 tablet 1  . spironolactone (ALDACTONE) 25 MG tablet TAKE ONE TABLET BY MOUTH ONCE DAILY 30 tablet 3   No current facility-administered medications for this encounter.      PHYSICAL EXAM: Vitals:   12/12/16 1057  BP: (!) 112/58  Pulse: (!) 56  SpO2: 95%  Weight: 157 lb 6.4 oz (71.4 kg)    General: Well appearing male, severe kyphosis, brother present at visit.  HEENT: normal.  Neck: supple.JVP flat. Carotids 2+  bilaterally; no bruits. No lymphadenopathy or thryomegaly appreciated. Cor: PMI laterally displaced,regular rate and rhythm. S1, S2.  Lungs: Clear in all lobes. Abdomen: soft, nontender, nondistended. No hepatosplenomegaly. No bruits or masses. Good bowel sounds. Extremities: no cyanosis, clubbing, rash. No lower extremity edema.  Neuro: alert & orientedx3, cranial nerves grossly intact. Moves all 4 extremities w/o difficulty. Affect pleasant.  ASSESSMENT & PLAN:  1. Chronic systolic HF ECHO 7846  EF%. 25-30% Offered ICD in the past but he declined.  Echo 3/17 EF 35% -NYHA II. Volume status stable.  - Continue lasix every other day.  - Continue carvedilol 3.125 mg twice a day - Continue hydralazine 25 mg tid/Imdur 30 mg daily  - Continue losartan 50 mg daily.BP too soft to switch to [employer].  - Continue Arlyce Harman.  - No medication changes, he is stable on this regimen.  2. AFL - maintaining SR on amio. On apixaban. Dr. Willey Blade following labs.  3. CKD, stage 3  - BMET today. 4. LBBB - stable  F/u 6 months.  Joshua Leas, NP-C 11:03 AM  Patient seen and examined with Joshua Booze, NP. We discussed all aspects of the encounter. I agree with the assessment and plan as stated above.   He is doing well. Volume status looks good. He has been stable on current regimen for some time. BP & HR too low to titrate meds further. Continue current regimen. Not interested in ICD. Check labs today.   Joshua Bickers, MD  3:38 PM

## 2017-01-04 ENCOUNTER — Other Ambulatory Visit (HOSPITAL_COMMUNITY): Payer: Self-pay | Admitting: Adult Health

## 2017-01-04 ENCOUNTER — Other Ambulatory Visit (HOSPITAL_COMMUNITY): Payer: Self-pay | Admitting: Cardiology

## 2017-01-06 DIAGNOSIS — L308 Other specified dermatitis: Secondary | ICD-10-CM | POA: Diagnosis not present

## 2017-01-06 DIAGNOSIS — R21 Rash and other nonspecific skin eruption: Secondary | ICD-10-CM | POA: Diagnosis not present

## 2017-01-06 DIAGNOSIS — I872 Venous insufficiency (chronic) (peripheral): Secondary | ICD-10-CM | POA: Diagnosis not present

## 2017-01-06 DIAGNOSIS — L508 Other urticaria: Secondary | ICD-10-CM | POA: Diagnosis not present

## 2017-02-01 ENCOUNTER — Other Ambulatory Visit (HOSPITAL_COMMUNITY): Payer: Self-pay | Admitting: Adult Health

## 2017-02-01 ENCOUNTER — Other Ambulatory Visit (HOSPITAL_COMMUNITY): Payer: Self-pay | Admitting: Cardiology

## 2017-02-08 ENCOUNTER — Other Ambulatory Visit (HOSPITAL_COMMUNITY): Payer: Self-pay | Admitting: Adult Health

## 2017-03-05 DIAGNOSIS — Z79899 Other long term (current) drug therapy: Secondary | ICD-10-CM | POA: Diagnosis not present

## 2017-03-05 DIAGNOSIS — I5032 Chronic diastolic (congestive) heart failure: Secondary | ICD-10-CM | POA: Diagnosis not present

## 2017-03-05 DIAGNOSIS — N184 Chronic kidney disease, stage 4 (severe): Secondary | ICD-10-CM | POA: Diagnosis not present

## 2017-03-06 LAB — BASIC METABOLIC PANEL
BUN: 49 — AB (ref 4–21)
CREATININE: 2.7 — AB (ref 0.6–1.3)
Glucose: 86
Potassium: 5.3 (ref 3.4–5.3)
Sodium: 139 (ref 137–147)

## 2017-03-13 DIAGNOSIS — M19042 Primary osteoarthritis, left hand: Secondary | ICD-10-CM | POA: Diagnosis not present

## 2017-03-13 DIAGNOSIS — M1812 Unilateral primary osteoarthritis of first carpometacarpal joint, left hand: Secondary | ICD-10-CM | POA: Diagnosis not present

## 2017-03-13 DIAGNOSIS — M6740 Ganglion, unspecified site: Secondary | ICD-10-CM | POA: Diagnosis not present

## 2017-03-19 DIAGNOSIS — I5022 Chronic systolic (congestive) heart failure: Secondary | ICD-10-CM | POA: Diagnosis not present

## 2017-03-19 DIAGNOSIS — I4892 Unspecified atrial flutter: Secondary | ICD-10-CM | POA: Diagnosis not present

## 2017-03-19 DIAGNOSIS — N184 Chronic kidney disease, stage 4 (severe): Secondary | ICD-10-CM | POA: Diagnosis not present

## 2017-04-07 ENCOUNTER — Telehealth (HOSPITAL_COMMUNITY): Payer: Self-pay | Admitting: Vascular Surgery

## 2017-04-07 ENCOUNTER — Other Ambulatory Visit (HOSPITAL_COMMUNITY): Payer: Self-pay | Admitting: Cardiology

## 2017-04-07 MED ORDER — APIXABAN 5 MG PO TABS: 5.0000 mg | ORAL_TABLET | Freq: Two times a day (BID) | ORAL | 3 refills | Status: DC

## 2017-04-07 NOTE — Telephone Encounter (Signed)
Left pt message to make 6 month f.u appt w/ db in sept

## 2017-04-24 ENCOUNTER — Emergency Department (HOSPITAL_COMMUNITY): Payer: Medicare HMO

## 2017-04-24 ENCOUNTER — Encounter (HOSPITAL_COMMUNITY): Payer: Self-pay

## 2017-04-24 ENCOUNTER — Observation Stay (HOSPITAL_COMMUNITY)
Admission: EM | Admit: 2017-04-24 | Discharge: 2017-04-26 | Disposition: A | Discharge status: Home / Self Care | Payer: Medicare HMO | Attending: Internal Medicine | Admitting: Internal Medicine

## 2017-04-24 DIAGNOSIS — Z7902 Long term (current) use of antithrombotics/antiplatelets: Secondary | ICD-10-CM | POA: Diagnosis not present

## 2017-04-24 DIAGNOSIS — I5022 Chronic systolic (congestive) heart failure: Secondary | ICD-10-CM | POA: Insufficient documentation

## 2017-04-24 DIAGNOSIS — R509 Fever, unspecified: Secondary | ICD-10-CM | POA: Diagnosis not present

## 2017-04-24 DIAGNOSIS — R3129 Other microscopic hematuria: Principal | ICD-10-CM | POA: Insufficient documentation

## 2017-04-24 DIAGNOSIS — I429 Cardiomyopathy, unspecified: Secondary | ICD-10-CM

## 2017-04-24 DIAGNOSIS — J449 Chronic obstructive pulmonary disease, unspecified: Secondary | ICD-10-CM | POA: Diagnosis present

## 2017-04-24 DIAGNOSIS — Z7901 Long term (current) use of anticoagulants: Secondary | ICD-10-CM | POA: Insufficient documentation

## 2017-04-24 DIAGNOSIS — N189 Chronic kidney disease, unspecified: Secondary | ICD-10-CM | POA: Insufficient documentation

## 2017-04-24 DIAGNOSIS — I4892 Unspecified atrial flutter: Secondary | ICD-10-CM | POA: Diagnosis present

## 2017-04-24 DIAGNOSIS — R404 Transient alteration of awareness: Secondary | ICD-10-CM | POA: Diagnosis not present

## 2017-04-24 DIAGNOSIS — R531 Weakness: Secondary | ICD-10-CM | POA: Diagnosis not present

## 2017-04-24 DIAGNOSIS — E039 Hypothyroidism, unspecified: Secondary | ICD-10-CM | POA: Insufficient documentation

## 2017-04-24 DIAGNOSIS — Z79899 Other long term (current) drug therapy: Secondary | ICD-10-CM | POA: Insufficient documentation

## 2017-04-24 DIAGNOSIS — R061 Stridor: Secondary | ICD-10-CM | POA: Diagnosis not present

## 2017-04-24 LAB — CBC WITH DIFFERENTIAL/PLATELET
BASOS ABS: 0 10*3/uL (ref 0.0–0.1)
BASOS PCT: 0 %
Eosinophils Absolute: 0.1 10*3/uL (ref 0.0–0.7)
Eosinophils Relative: 1 %
HCT: 38.9 % — ABNORMAL LOW (ref 39.0–52.0)
HEMOGLOBIN: 12.9 g/dL — AB (ref 13.0–17.0)
LYMPHS PCT: 4 %
Lymphs Abs: 0.5 10*3/uL — ABNORMAL LOW (ref 0.7–4.0)
MCH: 33.3 pg (ref 26.0–34.0)
MCHC: 33.2 g/dL (ref 30.0–36.0)
MCV: 100.5 fL — ABNORMAL HIGH (ref 78.0–100.0)
Monocytes Absolute: 1.2 10*3/uL — ABNORMAL HIGH (ref 0.1–1.0)
Monocytes Relative: 8 %
NEUTROS ABS: 13.2 10*3/uL — AB (ref 1.7–7.7)
NEUTROS PCT: 87 %
Platelets: 171 10*3/uL (ref 150–400)
RBC: 3.87 MIL/uL — AB (ref 4.22–5.81)
RDW: 12.2 % (ref 11.5–15.5)
WBC: 15.1 10*3/uL — ABNORMAL HIGH (ref 4.0–10.5)

## 2017-04-24 LAB — COMPREHENSIVE METABOLIC PANEL
ALBUMIN: 3.6 g/dL (ref 3.5–5.0)
ALK PHOS: 59 U/L (ref 38–126)
ALT: 18 U/L (ref 17–63)
AST: 24 U/L (ref 15–41)
Anion gap: 7 (ref 5–15)
BILIRUBIN TOTAL: 0.8 mg/dL (ref 0.3–1.2)
BUN: 45 mg/dL — AB (ref 6–20)
CALCIUM: 8.6 mg/dL — AB (ref 8.9–10.3)
CO2: 24 mmol/L (ref 22–32)
CREATININE: 2.22 mg/dL — AB (ref 0.61–1.24)
Chloride: 109 mmol/L (ref 101–111)
GFR calc Af Amer: 31 mL/min — ABNORMAL LOW (ref >60)
GFR calc non Af Amer: 27 mL/min — ABNORMAL LOW (ref >60)
GLUCOSE: 114 mg/dL — AB (ref 65–99)
POTASSIUM: 4.7 mmol/L (ref 3.5–5.1)
Sodium: 140 mmol/L (ref 135–145)
TOTAL PROTEIN: 7 g/dL (ref 6.5–8.1)

## 2017-04-24 LAB — URINALYSIS, ROUTINE W REFLEX MICROSCOPIC
BILIRUBIN URINE: NEGATIVE
Bacteria, UA: NONE SEEN
GLUCOSE, UA: NEGATIVE mg/dL
KETONES UR: NEGATIVE mg/dL
LEUKOCYTES UA: NEGATIVE
Nitrite: NEGATIVE
PROTEIN: NEGATIVE mg/dL
Specific Gravity, Urine: 1.016 (ref 1.005–1.030)
pH: 6 (ref 5.0–8.0)

## 2017-04-24 LAB — LACTIC ACID, PLASMA: Lactic Acid, Venous: 0.7 mmol/L (ref 0.5–1.9)

## 2017-04-24 MED ORDER — SODIUM CHLORIDE 0.9 % IV BOLUS (SEPSIS)
500.0000 mL | Freq: Once | INTRAVENOUS | Status: AC
  Administered 2017-04-25: 500 mL via INTRAVENOUS

## 2017-04-24 MED ORDER — ACETAMINOPHEN 325 MG PO TABS
650.0000 mg | ORAL_TABLET | Freq: Once | ORAL | Status: AC
  Administered 2017-04-24: 650 mg via ORAL
  Filled 2017-04-24: qty 2

## 2017-04-24 MED ORDER — CEPHALEXIN 500 MG PO CAPS: 500.0000 mg | ORAL_CAPSULE | Freq: Four times a day (QID) | ORAL | 0 refills | Status: DC

## 2017-04-24 MED ORDER — DEXTROSE 5 % IV SOLN
1.0000 g | Freq: Once | INTRAVENOUS | Status: AC
  Administered 2017-04-24: 1 g via INTRAVENOUS
  Filled 2017-04-24: qty 10

## 2017-04-24 MED ORDER — SODIUM CHLORIDE 0.9 % IV BOLUS (SEPSIS)
500.0000 mL | Freq: Once | INTRAVENOUS | Status: AC
  Administered 2017-04-24: 500 mL via INTRAVENOUS

## 2017-04-24 NOTE — Discharge Instructions (Signed)
Follow up with your md next week.  Take tylenol for fever

## 2017-04-24 NOTE — ED Provider Notes (Addendum)
[facility] DEPT Provider Note   CSN: [id number] Arrival date & time: 04/24/17  5462     History   Chief Complaint Chief Complaint  Patient presents with  . Weakness    HPI [name] is a 81 y.o. male.  Patient complains of fever and weakness.   The history is provided by the patient.  Weakness  Primary symptoms include no focal weakness. This is a new problem. The current episode started 12 to 24 hours ago. The problem has not changed since onset.There was no focality noted. The maximum temperature recorded prior to his arrival was 100 to 100.9 F. Pertinent negatives include no shortness of breath, no chest pain and no headaches. Associated medical issues do not include trauma.    Past Medical History:  Diagnosis Date  . Arthritis   . Cardiomyopathy    Nonischemic, LVEF 20%  . Chronic renal insufficiency   . COPD (chronic obstructive pulmonary disease) ([place])   . Hypothyroidism   . Left bundle branch block   . Mixed hyperlipidemia   . Scoliosis     Patient Active Problem List   Diagnosis Date Noted  . Long term (current) use of anticoagulants 01/31/2011  . Rash 01/13/2011  . Atrial flutter ([place]) 12/19/2010  . CHRONIC SYSTOLIC HEART FAILURE 70/35/0093  . Mixed hyperlipidemia 11/08/2010  . CARDIOMYOPATHY, SECONDARY 11/08/2010  . LBBB 11/08/2010  . ACUTE ON CHRONIC SYSTOLIC HEART FAILURE 81/82/9937    Past Surgical History:  Procedure Laterality Date  . EYE SURGERY    . LAPAROSCOPIC CHOLECYSTECTOMY  2008  . left foot surgery  1998  . MASS EXCISION Left 06/26/2016   Procedure: EXCISION Mucoid tumor;  Surgeon: Daryll Brod, MD;  Location: [facility];  Service: Orthopedics;  Laterality: Left;  FAB  . Right inguinal herniorrhaphy    . ROTATION FLAP Left 06/26/2016   Procedure: debridement of distal interphalangeal possible, ROTATION FLAP left index finger;  Surgeon: Daryll Brod, MD;  Location: [facility];  Service: Orthopedics;  Laterality: Left;  . SKIN CANCER  REMOVED  LEFT EAR         Home Medications    Prior to Admission medications   Medication Sig Start Date End Date Taking? Authorizing Provider  amiodarone (PACERONE) 200 MG tablet TAKE ONE TABLET BY MOUTH ONCE DAILY 01/06/17  Yes Bensimhon, Shaune Pascal, MD  apixaban (ELIQUIS) 5 MG TABS tablet Take 1 tablet (5 mg total) by mouth 2 (two) times daily. 04/07/17  Yes Bensimhon, Shaune Pascal, MD  carvedilol (COREG) 3.125 MG tablet TAKE ONE TABLET BY MOUTH TWICE DAILY WITH FOOD 02/02/17  Yes Clegg, Amy D, NP  diclofenac sodium (VOLTAREN) 1 % GEL Apply 1 application topically 2 (two) times daily.   Yes [provider]  hydrALAZINE (APRESOLINE) 25 MG tablet Take 1 tablet (25 mg total) by mouth 3 (three) times daily. 01/06/17  Yes Bensimhon, Shaune Pascal, MD  isosorbide mononitrate (IMDUR) 30 MG 24 hr tablet TAKE ONE TABLET BY MOUTH ONCE DAILY IN THE MORNING 02/10/17  Yes Bensimhon, Shaune Pascal, MD  levothyroxine (SYNTHROID, LEVOTHROID) 200 MCG tablet Take 200 mcg by mouth daily before breakfast.   Yes [provider]  loratadine (CLARITIN) 10 MG tablet Take 10 mg by mouth daily.   Yes [provider]  losartan (COZAAR) 50 MG tablet Take 1 tablet (50 mg total) by mouth daily. 11/25/16  Yes Bensimhon, Shaune Pascal, MD  spironolactone (ALDACTONE) 25 MG tablet TAKE ONE TABLET BY MOUTH ONCE DAILY 02/03/17  Yes Bensimhon,  Shaune Pascal, MD  aspirin EC 81 MG tablet Take 81 mg by mouth daily.    [provider]  cephALEXin (KEFLEX) 500 MG capsule Take 1 capsule (500 mg total) by mouth 4 (four) times daily. 04/24/17   Milton Ferguson, MD  furosemide (LASIX) 40 MG tablet TAKE ONE TABLET BY MOUTH EVERY OTHER DAY MAY TAKE AN EXTRA TABLET IF WEIGHT GAIN OF 146 OR GREATER 06/17/16   Clegg, Amy D, NP    Family History Family History  Problem Relation Age of Onset  . Heart disease Father        Died age 2  . Stroke Mother        Died age 91    Social History Social History  Substance Use Topics  .  Smoking status: Never Smoker  . Smokeless tobacco: Never Used  . Alcohol use No     Allergies   No known allergies   Review of Systems Review of Systems  Constitutional: Negative for appetite change and fatigue.  HENT: Negative for congestion, ear discharge and sinus pressure.   Eyes: Negative for discharge.  Respiratory: Negative for cough and shortness of breath.   Cardiovascular: Negative for chest pain.  Gastrointestinal: Negative for abdominal pain and diarrhea.  Genitourinary: Negative for frequency and hematuria.  Musculoskeletal: Negative for back pain.  Skin: Negative for rash.  Neurological: Positive for weakness. Negative for focal weakness, seizures and headaches.  Psychiatric/Behavioral: Negative for hallucinations.     Physical Exam Updated Vital Signs BP (!) 121/51 (BP Location: Left Arm)   Pulse 64   Temp 99.3 F (37.4 C) (Oral)   Resp 18   Ht 5' (1.524 m)   Wt 68 kg (150 lb)   SpO2 92%   BMI 29.29 kg/m   Physical Exam  Constitutional: He is oriented to person, place, and time. He appears well-developed.  HENT:  Head: Normocephalic.  Eyes: Conjunctivae and EOM are normal. No scleral icterus.  Neck: Neck supple. No thyromegaly present.  Cardiovascular: Normal rate and regular rhythm.  Exam reveals no gallop and no friction rub.   No murmur heard. Pulmonary/Chest: No stridor. He has no wheezes. He has no rales. He exhibits no tenderness.  Abdominal: He exhibits no distension. There is no tenderness. There is no rebound.  Musculoskeletal: Normal range of motion. He exhibits no edema.  Lymphadenopathy:    He has no cervical adenopathy.  Neurological: He is oriented to person, place, and time. He exhibits normal muscle tone. Coordination normal.  Skin: No rash noted. No erythema.  Psychiatric: He has a normal mood and affect. His behavior is normal.     ED Treatments / Results  Labs (all labs ordered are listed, but only abnormal results are  displayed) Labs Reviewed  CBC WITH DIFFERENTIAL/PLATELET - Abnormal; Notable for the following:       Result Value   WBC 15.1 (*)    RBC 3.87 (*)    Hemoglobin 12.9 (*)    HCT 38.9 (*)    MCV 100.5 (*)    Neutro Abs 13.2 (*)    Lymphs Abs 0.5 (*)    Monocytes Absolute 1.2 (*)    All other components within normal limits  COMPREHENSIVE METABOLIC PANEL - Abnormal; Notable for the following:    Glucose, Bld 114 (*)    BUN 45 (*)    Creatinine, Ser 2.22 (*)    Calcium 8.6 (*)    GFR calc non Af Amer 27 (*)  GFR calc Af Amer 31 (*)    All other components within normal limits  URINALYSIS, ROUTINE W REFLEX MICROSCOPIC - Abnormal; Notable for the following:    Hgb urine dipstick SMALL (*)    Squamous Epithelial / LPF 0-5 (*)    All other components within normal limits  URINE CULTURE  LACTIC ACID, PLASMA    EKG  EKG Interpretation  Date/Time:  Friday April 24 2017 19:31:24 EDT Ventricular Rate:  77 PR Interval:    QRS Duration: 156 QT Interval:  469 QTC Calculation: 531 R Axis:   29 Text Interpretation:  Sinus rhythm Prolonged PR interval IVCD, consider atypical LBBB Baseline wander in lead(s) I III aVL Confirmed by Milton Ferguson [phone]) on 04/24/2017 9:32:28 PM       Radiology Dg Chest 2 View  Result Date: 04/24/2017 CLINICAL DATA:  Fever and generalized weakness today. EXAM: CHEST  2 VIEW COMPARISON:  07/26/2015. FINDINGS: Stable enlarged cardiac silhouette. Clear lungs. Diffuse osteopenia. Positional scoliosis and thoracic spine degenerative changes with accentuated kyphosis of the thoracic spine. Cholecystectomy clips. IMPRESSION: No acute abnormality.  Stable cardiomegaly. Electronically Signed   By: Claudie Revering M.D.   On: 04/24/2017 20:49    Procedures Procedures (including critical care time)  Medications Ordered in ED Medications  cefTRIAXone (ROCEPHIN) 1 g in dextrose 5 % 50 mL IVPB (not administered)  sodium chloride 0.9 % bolus 500 mL (0 mLs Intravenous  Stopped 04/24/17 2113)  acetaminophen (TYLENOL) tablet 650 mg (650 mg Oral Given 04/24/17 1956)     Initial Impression / Assessment and Plan / ED Course  I have reviewed the triage vital signs and the nursing notes.  Pertinent labs & imaging results that were available during my care of the patient were reviewed by me and considered in my medical decision making (see chart for details).   patient will be admitted for observation for a febrile illness and fatigue and hematuria   Final Clinical Impressions(s) / ED Diagnoses   Final diagnoses:  Hematuria, microscopic    New Prescriptions    Milton Ferguson, MD 04/24/17 2141    Milton Ferguson, MD 04/24/17 2356

## 2017-04-24 NOTE — ED Triage Notes (Signed)
Fever and generalized weakness onset today.  Per caregiver, pt was not able to get up to ambulate as per his usual.  Pt is awake and alert, denies pain

## 2017-04-25 ENCOUNTER — Encounter (HOSPITAL_COMMUNITY): Payer: Self-pay | Admitting: Family Medicine

## 2017-04-25 DIAGNOSIS — R531 Weakness: Secondary | ICD-10-CM | POA: Diagnosis not present

## 2017-04-25 DIAGNOSIS — I4892 Unspecified atrial flutter: Secondary | ICD-10-CM | POA: Diagnosis not present

## 2017-04-25 DIAGNOSIS — I5022 Chronic systolic (congestive) heart failure: Secondary | ICD-10-CM | POA: Diagnosis not present

## 2017-04-25 DIAGNOSIS — R509 Fever, unspecified: Secondary | ICD-10-CM

## 2017-04-25 DIAGNOSIS — J449 Chronic obstructive pulmonary disease, unspecified: Secondary | ICD-10-CM | POA: Diagnosis not present

## 2017-04-25 DIAGNOSIS — R3129 Other microscopic hematuria: Principal | ICD-10-CM | POA: Insufficient documentation

## 2017-04-25 DIAGNOSIS — I429 Cardiomyopathy, unspecified: Secondary | ICD-10-CM | POA: Diagnosis not present

## 2017-04-25 LAB — BASIC METABOLIC PANEL
ANION GAP: 8 (ref 5–15)
BUN: 37 mg/dL — ABNORMAL HIGH (ref 6–20)
CHLORIDE: 108 mmol/L (ref 101–111)
CO2: 24 mmol/L (ref 22–32)
Calcium: 8 mg/dL — ABNORMAL LOW (ref 8.9–10.3)
Creatinine, Ser: 1.97 mg/dL — ABNORMAL HIGH (ref 0.61–1.24)
GFR calc Af Amer: 36 mL/min — ABNORMAL LOW (ref >60)
GFR calc non Af Amer: 31 mL/min — ABNORMAL LOW (ref >60)
GLUCOSE: 99 mg/dL (ref 65–99)
POTASSIUM: 4.1 mmol/L (ref 3.5–5.1)
Sodium: 140 mmol/L (ref 135–145)

## 2017-04-25 LAB — CBC
HEMATOCRIT: 35.9 % — AB (ref 39.0–52.0)
HEMOGLOBIN: 12 g/dL — AB (ref 13.0–17.0)
MCH: 33.6 pg (ref 26.0–34.0)
MCHC: 33.4 g/dL (ref 30.0–36.0)
MCV: 100.6 fL — ABNORMAL HIGH (ref 78.0–100.0)
Platelets: 145 10*3/uL — ABNORMAL LOW (ref 150–400)
RBC: 3.57 MIL/uL — AB (ref 4.22–5.81)
RDW: 12.1 % (ref 11.5–15.5)
WBC: 9.4 10*3/uL (ref 4.0–10.5)

## 2017-04-25 MED ORDER — FUROSEMIDE 40 MG PO TABS
40.0000 mg | ORAL_TABLET | Freq: Every day | ORAL | Status: DC
  Administered 2017-04-25 – 2017-04-26 (×2): 40 mg via ORAL
  Filled 2017-04-25 (×2): qty 1

## 2017-04-25 MED ORDER — LEVOTHYROXINE SODIUM 100 MCG PO TABS
200.0000 ug | ORAL_TABLET | Freq: Every day | ORAL | Status: DC
  Administered 2017-04-25 – 2017-04-26 (×2): 200 ug via ORAL
  Filled 2017-04-25 (×2): qty 2

## 2017-04-25 MED ORDER — ACETAMINOPHEN 325 MG PO TABS: 650.0000 mg | ORAL_TABLET | Freq: Four times a day (QID) | ORAL | Status: DC | PRN

## 2017-04-25 MED ORDER — CARVEDILOL 3.125 MG PO TABS
3.1250 mg | ORAL_TABLET | Freq: Two times a day (BID) | ORAL | Status: DC
  Filled 2017-04-25: qty 1

## 2017-04-25 MED ORDER — ACETAMINOPHEN 650 MG RE SUPP: 650.0000 mg | Freq: Four times a day (QID) | RECTAL | Status: DC | PRN

## 2017-04-25 MED ORDER — SPIRONOLACTONE 25 MG PO TABS
25.0000 mg | ORAL_TABLET | Freq: Every day | ORAL | Status: DC
  Administered 2017-04-25 – 2017-04-26 (×2): 25 mg via ORAL
  Filled 2017-04-25 (×2): qty 1

## 2017-04-25 MED ORDER — LOSARTAN POTASSIUM 50 MG PO TABS
50.0000 mg | ORAL_TABLET | Freq: Every day | ORAL | Status: DC
  Administered 2017-04-25 – 2017-04-26 (×2): 50 mg via ORAL
  Filled 2017-04-25 (×2): qty 1

## 2017-04-25 MED ORDER — HYDRALAZINE HCL 25 MG PO TABS
25.0000 mg | ORAL_TABLET | Freq: Three times a day (TID) | ORAL | Status: DC
  Filled 2017-04-25: qty 1

## 2017-04-25 MED ORDER — SODIUM CHLORIDE 0.9 % IV SOLN
INTRAVENOUS | Status: DC
  Administered 2017-04-25 (×2): via INTRAVENOUS

## 2017-04-25 MED ORDER — ISOSORBIDE MONONITRATE ER 60 MG PO TB24
30.0000 mg | ORAL_TABLET | Freq: Every day | ORAL | Status: DC
  Administered 2017-04-25 – 2017-04-26 (×2): 30 mg via ORAL
  Filled 2017-04-25 (×2): qty 1

## 2017-04-25 MED ORDER — ASPIRIN EC 81 MG PO TBEC
81.0000 mg | DELAYED_RELEASE_TABLET | Freq: Every day | ORAL | Status: DC
  Administered 2017-04-25 – 2017-04-26 (×2): 81 mg via ORAL
  Filled 2017-04-25 (×2): qty 1

## 2017-04-25 MED ORDER — DEXTROSE 5 % IV SOLN
1.0000 g | INTRAVENOUS | Status: DC
  Filled 2017-04-25 (×2): qty 10

## 2017-04-25 MED ORDER — AMIODARONE HCL 200 MG PO TABS
200.0000 mg | ORAL_TABLET | Freq: Every day | ORAL | Status: DC
  Administered 2017-04-25 – 2017-04-26 (×2): 200 mg via ORAL
  Filled 2017-04-25 (×2): qty 1

## 2017-04-25 MED ORDER — APIXABAN 5 MG PO TABS
5.0000 mg | ORAL_TABLET | Freq: Two times a day (BID) | ORAL | Status: DC
  Administered 2017-04-25: 5 mg via ORAL
  Filled 2017-04-25 (×2): qty 1

## 2017-04-25 MED ORDER — CEFAZOLIN SODIUM-DEXTROSE 1-4 GM/50ML-% IV SOLN
1.0000 g | Freq: Two times a day (BID) | INTRAVENOUS | Status: DC
  Administered 2017-04-25 – 2017-04-26 (×3): 1 g via INTRAVENOUS
  Filled 2017-04-25 (×5): qty 50

## 2017-04-25 MED ORDER — APIXABAN 2.5 MG PO TABS
2.5000 mg | ORAL_TABLET | Freq: Two times a day (BID) | ORAL | Status: DC
  Administered 2017-04-25 – 2017-04-26 (×3): 2.5 mg via ORAL
  Filled 2017-04-25 (×2): qty 1

## 2017-04-25 MED ORDER — HYDRALAZINE HCL 25 MG PO TABS
12.5000 mg | ORAL_TABLET | Freq: Three times a day (TID) | ORAL | Status: DC
  Administered 2017-04-25 – 2017-04-26 (×3): 12.5 mg via ORAL
  Filled 2017-04-25 (×2): qty 1

## 2017-04-25 MED ORDER — CARVEDILOL 3.125 MG PO TABS
6.2500 mg | ORAL_TABLET | Freq: Two times a day (BID) | ORAL | Status: DC
  Administered 2017-04-25 – 2017-04-26 (×2): 6.25 mg via ORAL
  Filled 2017-04-25 (×2): qty 2

## 2017-04-25 NOTE — Progress Notes (Signed)
Subjective: He was admitted last night with fever and weakness. He was unable to stand or walk. He had a leukocytosis of 15,000. Chest x-ray was negative. Urinalysis revealed red cells but it was a catheterized specimen. Nitrite and leukocyte esterase were negative. He had a CT scan of his head which was negative. He was treated with Rocephin. He states he feels better this morning. His temperature now is 98.4. He had a fever documented at 102.  Objective: Vital signs in last 24 hours: Vitals:   04/25/17 0022 04/25/17 0114 04/25/17 0608 04/25/17 0643  BP: (!) 115/47  (!) 114/37 (!) 101/39  Pulse: 65  63   Resp: 20 18 18    Temp: 98.7 F (37.1 C) 98.7 F (37.1 C) 98.4 F (36.9 C)   TempSrc: Oral Oral Oral   SpO2: 96% 97% 94%   Weight:  154 lb (69.9 kg)    Height:  5' (1.524 m)     Weight change:   Intake/Output Summary (Last 24 hours) at 04/25/17 0813 Last data filed at 04/25/17 0500  Gross per 24 hour  Intake           748.75 ml  Output              400 ml  Net           348.75 ml    Physical Exam: Alert. No distress. HEENT unremarkable. Lungs clear. Heart regular with no murmurs. Abdomen nontender with no hepatosplenomegaly. Extremities reveal redness and tenderness and increased warmth in the left lower leg consistent with cellulitis. He has old venous insufficiency changes also.  Lab Results:    Results for orders placed or performed during the hospital encounter of 04/24/17 (from the past 24 hour(s))  Urinalysis, Routine w reflex microscopic     Status: Abnormal   Collection Time: 04/24/17  7:43 PM  Result Value Ref Range   Color, Urine YELLOW YELLOW   APPearance CLEAR CLEAR   Specific Gravity, Urine 1.016 1.005 - 1.030   pH 6.0 5.0 - 8.0   Glucose, UA NEGATIVE NEGATIVE mg/dL   Hgb urine dipstick SMALL (A) NEGATIVE   Bilirubin Urine NEGATIVE NEGATIVE   Ketones, ur NEGATIVE NEGATIVE mg/dL   Protein, ur NEGATIVE NEGATIVE mg/dL   Nitrite NEGATIVE NEGATIVE    Leukocytes, UA NEGATIVE NEGATIVE   RBC / HPF 6-30 0 - 5 RBC/hpf   WBC, UA 0-5 0 - 5 WBC/hpf   Bacteria, UA NONE SEEN NONE SEEN   Squamous Epithelial / LPF 0-5 (A) NONE SEEN  CBC with Differential/Platelet     Status: Abnormal   Collection Time: 04/24/17  8:47 PM  Result Value Ref Range   WBC 15.1 (H) 4.0 - 10.5 K/uL   RBC 3.87 (L) 4.22 - 5.81 MIL/uL   Hemoglobin 12.9 (L) 13.0 - 17.0 g/dL   HCT 38.9 (L) 39.0 - 52.0 %   MCV 100.5 (H) 78.0 - 100.0 fL   MCH 33.3 26.0 - 34.0 pg   MCHC 33.2 30.0 - 36.0 g/dL   RDW 12.2 11.5 - 15.5 %   Platelets 171 150 - 400 K/uL   Neutrophils Relative % 87 %   Neutro Abs 13.2 (H) 1.7 - 7.7 K/uL   Lymphocytes Relative 4 %   Lymphs Abs 0.5 (L) 0.7 - 4.0 K/uL   Monocytes Relative 8 %   Monocytes Absolute 1.2 (H) 0.1 - 1.0 K/uL   Eosinophils Relative 1 %   Eosinophils Absolute 0.1 0.0 - 0.7 K/uL  Basophils Relative 0 %   Basophils Absolute 0.0 0.0 - 0.1 K/uL  Comprehensive metabolic panel     Status: Abnormal   Collection Time: 04/24/17  8:47 PM  Result Value Ref Range   Sodium 140 135 - 145 mmol/L   Potassium 4.7 3.5 - 5.1 mmol/L   Chloride 109 101 - 111 mmol/L   CO2 24 22 - 32 mmol/L   Glucose, Bld 114 (H) 65 - 99 mg/dL   BUN 45 (H) 6 - 20 mg/dL   Creatinine, Ser 2.22 (H) 0.61 - 1.24 mg/dL   Calcium 8.6 (L) 8.9 - 10.3 mg/dL   Total Protein 7.0 6.5 - 8.1 g/dL   Albumin 3.6 3.5 - 5.0 g/dL   AST 24 15 - 41 U/L   ALT 18 17 - 63 U/L   Alkaline Phosphatase 59 38 - 126 U/L   Total Bilirubin 0.8 0.3 - 1.2 mg/dL   GFR calc non Af Amer 27 (L) >60 mL/min   GFR calc Af Amer 31 (L) >60 mL/min   Anion gap 7 5 - 15  Lactic acid, plasma     Status: None   Collection Time: 04/24/17  8:47 PM  Result Value Ref Range   Lactic Acid, Venous 0.7 0.5 - 1.9 mmol/L  Basic metabolic panel     Status: Abnormal   Collection Time: 04/25/17  5:56 AM  Result Value Ref Range   Sodium 140 135 - 145 mmol/L   Potassium 4.1 3.5 - 5.1 mmol/L   Chloride 108 101 - 111  mmol/L   CO2 24 22 - 32 mmol/L   Glucose, Bld 99 65 - 99 mg/dL   BUN 37 (H) 6 - 20 mg/dL   Creatinine, Ser 1.97 (H) 0.61 - 1.24 mg/dL   Calcium 8.0 (L) 8.9 - 10.3 mg/dL   GFR calc non Af Amer 31 (L) >60 mL/min   GFR calc Af Amer 36 (L) >60 mL/min   Anion gap 8 5 - 15  CBC     Status: Abnormal   Collection Time: 04/25/17  5:56 AM  Result Value Ref Range   WBC 9.4 4.0 - 10.5 K/uL   RBC 3.57 (L) 4.22 - 5.81 MIL/uL   Hemoglobin 12.0 (L) 13.0 - 17.0 g/dL   HCT 35.9 (L) 39.0 - 52.0 %   MCV 100.6 (H) 78.0 - 100.0 fL   MCH 33.6 26.0 - 34.0 pg   MCHC 33.4 30.0 - 36.0 g/dL   RDW 12.1 11.5 - 15.5 %   Platelets 145 (L) 150 - 400 K/uL     ABGS No results for input(s): PHART, PO2ART, TCO2, HCO3 in the last 72 hours.  Invalid input(s): PCO2 CULTURES No results found for this or any previous visit (from the past 240 hour(s)). Studies/Results: Dg Chest 2 View  Result Date: 04/24/2017 CLINICAL DATA:  Fever and generalized weakness today. EXAM: CHEST  2 VIEW COMPARISON:  07/26/2015. FINDINGS: Stable enlarged cardiac silhouette. Clear lungs. Diffuse osteopenia. Positional scoliosis and thoracic spine degenerative changes with accentuated kyphosis of the thoracic spine. Cholecystectomy clips. IMPRESSION: No acute abnormality.  Stable cardiomegaly. Electronically Signed   By: Claudie Revering M.D.   On: 04/24/2017 20:49   Ct Head Wo Contrast  Result Date: 04/24/2017 CLINICAL DATA:  Fever and weakness EXAM: CT HEAD WITHOUT CONTRAST TECHNIQUE: Contiguous axial images were obtained from the base of the skull through the vertex without intravenous contrast. COMPARISON:  Head CT 07/26/2015 FINDINGS: Brain: No mass lesion, intraparenchymal hemorrhage or extra-axial  collection. No evidence of acute cortical infarct. Unchanged old lacunar infarct versus prominent perivascular space of the left lentiform nucleus. Vascular: No hyperdense vessel or unexpected calcification. Skull: Normal visualized skull base,  calvarium and extracranial soft tissues. Sinuses/Orbits: No sinus fluid levels or advanced mucosal thickening. No mastoid effusion. Normal orbits. IMPRESSION: No acute intracranial abnormality. Unchanged examination compared to 07/26/2015. Electronically Signed   By: Ulyses Jarred M.D.   On: 04/24/2017 22:56   Micro Results: No results found for this or any previous visit (from the past 240 hour(s)). Studies/Results: Dg Chest 2 View  Result Date: 04/24/2017 CLINICAL DATA:  Fever and generalized weakness today. EXAM: CHEST  2 VIEW COMPARISON:  07/26/2015. FINDINGS: Stable enlarged cardiac silhouette. Clear lungs. Diffuse osteopenia. Positional scoliosis and thoracic spine degenerative changes with accentuated kyphosis of the thoracic spine. Cholecystectomy clips. IMPRESSION: No acute abnormality.  Stable cardiomegaly. Electronically Signed   By: Claudie Revering M.D.   On: 04/24/2017 20:49   Ct Head Wo Contrast  Result Date: 04/24/2017 CLINICAL DATA:  Fever and weakness EXAM: CT HEAD WITHOUT CONTRAST TECHNIQUE: Contiguous axial images were obtained from the base of the skull through the vertex without intravenous contrast. COMPARISON:  Head CT 07/26/2015 FINDINGS: Brain: No mass lesion, intraparenchymal hemorrhage or extra-axial collection. No evidence of acute cortical infarct. Unchanged old lacunar infarct versus prominent perivascular space of the left lentiform nucleus. Vascular: No hyperdense vessel or unexpected calcification. Skull: Normal visualized skull base, calvarium and extracranial soft tissues. Sinuses/Orbits: No sinus fluid levels or advanced mucosal thickening. No mastoid effusion. Normal orbits. IMPRESSION: No acute intracranial abnormality. Unchanged examination compared to 07/26/2015. Electronically Signed   By: Ulyses Jarred M.D.   On: 04/24/2017 22:56   Medications:  I have reviewed the patient's current medications Scheduled Meds: . amiodarone  200 mg Oral Daily  . apixaban  2.5 mg  Oral BID  . aspirin EC  81 mg Oral Daily  . carvedilol  6.25 mg Oral BID WC  . furosemide  40 mg Oral Daily  . hydrALAZINE  12.5 mg Oral TID  . isosorbide mononitrate  30 mg Oral Daily  . levothyroxine  200 mcg Oral QAC breakfast  . losartan  50 mg Oral Daily  . spironolactone  25 mg Oral Daily   Continuous Infusions: .  ceFAZolin (ANCEF) IV     PRN Meds:.acetaminophen **OR** acetaminophen   Assessment/Plan: #1. Left lower extremity cellulitis. Treat with IV Ancef. Leukocytosis improved from 15.1-9.4.  Generalized weakness appears to have improved. #2. Chronic systolic heart failure. Stable. Continue current medical regimen. Doses modified to current use. #3. Atrial flutter. Continue Eiquis. Dose adjusted. #4. Chronic kidney disease stage IV. Creatinine is improved now at 1.97. Principal Problem:   Fever Active Problems:   Secondary cardiomyopathy ([facility])   Chronic systolic heart failure ([facility])   Atrial flutter ([facility])   Weakness   COPD (chronic obstructive pulmonary disease) ([facility])   CKD (chronic kidney disease)     LOS: 0 days   Deunte Bledsoe 04/25/2017, 8:13 AM

## 2017-04-25 NOTE — H&P (Signed)
History and Physical    YOSHIAKI KREUSER ZLD:[id number] DOB: 07-28-1936 DOA: 04/24/2017  PCP: Asencion Noble, MD  Patient coming from: home  Chief Complaint:   weakness  HPI: KOA ZOELLER is a 81 y.o. male with medical history significant of copd, CM, CKD comes in from home lives with wife for feeling very weak.  He reports he did not know he was running a fever but on arrival his temp was 103.  He denies any n/v/d.  No abdominal pain.  No sob or cough.  No rashes or pain anywhere on his skin.  No dysuria or urinary symptoms.  Pt was in the process of being discharged home from ED when it was thought by him and his wife that he could not get up and move around like he normally does.  He reports he ambulates without any assistance at home normally.  Review of Systems: As per HPI otherwise 10 point review of systems negative.   Past Medical History:  Diagnosis Date  . Arthritis   . Cardiomyopathy    Nonischemic, LVEF 20%  . Chronic renal insufficiency   . COPD (chronic obstructive pulmonary disease) ([place])   . Hypothyroidism   . Left bundle branch block   . Mixed hyperlipidemia   . Scoliosis     Past Surgical History:  Procedure Laterality Date  . EYE SURGERY    . LAPAROSCOPIC CHOLECYSTECTOMY  2008  . left foot surgery  1998  . MASS EXCISION Left 06/26/2016   Procedure: EXCISION Mucoid tumor;  Surgeon: Daryll Brod, MD;  Location: [facility];  Service: Orthopedics;  Laterality: Left;  FAB  . Right inguinal herniorrhaphy    . ROTATION FLAP Left 06/26/2016   Procedure: debridement of distal interphalangeal possible, ROTATION FLAP left index finger;  Surgeon: Daryll Brod, MD;  Location: [facility];  Service: Orthopedics;  Laterality: Left;  . SKIN CANCER REMOVED  LEFT EAR       reports that he has never smoked. He has never used smokeless tobacco. He reports that he does not drink alcohol or use drugs.  Allergies  Allergen Reactions  . No Known Allergies     Family History  Problem  Relation Age of Onset  . Heart disease Father        Died age 59  . Stroke Mother        Died age 68    Prior to Admission medications   Medication Sig Start Date End Date Taking? Authorizing Provider  amiodarone (PACERONE) 200 MG tablet TAKE ONE TABLET BY MOUTH ONCE DAILY 01/06/17  Yes Bensimhon, Shaune Pascal, MD  apixaban (ELIQUIS) 5 MG TABS tablet Take 1 tablet (5 mg total) by mouth 2 (two) times daily. 04/07/17  Yes Bensimhon, Shaune Pascal, MD  carvedilol (COREG) 3.125 MG tablet TAKE ONE TABLET BY MOUTH TWICE DAILY WITH FOOD 02/02/17  Yes Clegg, Amy D, NP  diclofenac sodium (VOLTAREN) 1 % GEL Apply 1 application topically 2 (two) times daily.   Yes [provider]  hydrALAZINE (APRESOLINE) 25 MG tablet Take 1 tablet (25 mg total) by mouth 3 (three) times daily. 01/06/17  Yes Bensimhon, Shaune Pascal, MD  isosorbide mononitrate (IMDUR) 30 MG 24 hr tablet TAKE ONE TABLET BY MOUTH ONCE DAILY IN THE MORNING 02/10/17  Yes Bensimhon, Shaune Pascal, MD  levothyroxine (SYNTHROID, LEVOTHROID) 200 MCG tablet Take 200 mcg by mouth daily before breakfast.   Yes [provider]  loratadine (CLARITIN) 10 MG tablet Take 10 mg by mouth  daily.   Yes [provider]  losartan (COZAAR) 50 MG tablet Take 1 tablet (50 mg total) by mouth daily. 11/25/16  Yes Bensimhon, Shaune Pascal, MD  spironolactone (ALDACTONE) 25 MG tablet TAKE ONE TABLET BY MOUTH ONCE DAILY 02/03/17  Yes Bensimhon, Shaune Pascal, MD  aspirin EC 81 MG tablet Take 81 mg by mouth daily.    [provider]  cephALEXin (KEFLEX) 500 MG capsule Take 1 capsule (500 mg total) by mouth 4 (four) times daily. 04/24/17   Milton Ferguson, MD  furosemide (LASIX) 40 MG tablet TAKE ONE TABLET BY MOUTH EVERY OTHER DAY MAY TAKE AN EXTRA TABLET IF WEIGHT GAIN OF 146 OR GREATER 06/17/16   Darrick Grinder D, NP    Physical Exam: Vitals:   04/24/17 2100 04/24/17 2115 04/24/17 2130 04/24/17 2131  BP: (!) 117/46  (!) 121/51 (!) 121/51  Pulse: 66 62 65 64  Resp: 20  19 19 18   Temp:    99.3 F (37.4 C)  TempSrc:    Oral  SpO2: 93% 95% 93% 92%  Weight:      Height:         Constitutional: NAD, calm, comfortable Vitals:   04/24/17 2100 04/24/17 2115 04/24/17 2130 04/24/17 2131  BP: (!) 117/46  (!) 121/51 (!) 121/51  Pulse: 66 62 65 64  Resp: 20 19 19 18   Temp:    99.3 F (37.4 C)  TempSrc:    Oral  SpO2: 93% 95% 93% 92%  Weight:      Height:       Eyes: PERRL, lids and conjunctivae normal ENMT: Mucous membranes are moist. Posterior pharynx clear of any exudate or lesions.Normal dentition.  Neck: normal, supple, no masses, no thyromegaly Respiratory: clear to auscultation bilaterally, no wheezing, no crackles. Normal respiratory effort. No accessory muscle use.  Cardiovascular: Regular rate and rhythm, no murmurs / rubs / gallops. No extremity edema. 2+ pedal pulses. No carotid bruits.  Abdomen: no tenderness, no masses palpated. No hepatosplenomegaly. Bowel sounds positive.  Musculoskeletal: no clubbing / cyanosis. No joint deformity upper and lower extremities. Good ROM, no contractures. Normal muscle tone.  Skin: no rashes, lesions, ulcers. No induration Neurologic: CN 2-12 grossly intact. Sensation intact, DTR normal. Strength 5/5 in all 4.  Psychiatric: Normal judgment and insight. Alert and oriented x 3. Normal mood.    Labs on Admission: I have personally reviewed following labs and imaging studies  CBC:  Recent Labs Lab 04/24/17 2047  WBC 15.1*  NEUTROABS 13.2*  HGB 12.9*  HCT 38.9*  MCV 100.5*  PLT 952   Basic Metabolic Panel:  Recent Labs Lab 04/24/17 2047  NA 140  K 4.7  CL 109  CO2 24  GLUCOSE 114*  BUN 45*  CREATININE 2.22*  CALCIUM 8.6*   GFR: Estimated Creatinine Clearance: 22.2 mL/min (A) (by C-G formula based on SCr of 2.22 mg/dL (H)). Liver Function Tests:  Recent Labs Lab 04/24/17 2047  AST 24  ALT 18  ALKPHOS 59  BILITOT 0.8  PROT 7.0  ALBUMIN 3.6    Urine analysis:    Component  Value Date/Time   COLORURINE YELLOW 04/24/2017 1943   APPEARANCEUR CLEAR 04/24/2017 1943   LABSPEC 1.016 04/24/2017 1943   PHURINE 6.0 04/24/2017 1943   GLUCOSEU NEGATIVE 04/24/2017 1943   HGBUR SMALL (A) 04/24/2017 [place] 04/24/2017 [place] 04/24/2017 1943   PROTEINUR NEGATIVE 04/24/2017 1943   UROBILINOGEN 0.2 01/24/2012 1315   NITRITE  NEGATIVE 04/24/2017 1943   LEUKOCYTESUR NEGATIVE 04/24/2017 1943    Radiological Exams on Admission: Dg Chest 2 View  Result Date: 04/24/2017 CLINICAL DATA:  Fever and generalized weakness today. EXAM: CHEST  2 VIEW COMPARISON:  07/26/2015. FINDINGS: Stable enlarged cardiac silhouette. Clear lungs. Diffuse osteopenia. Positional scoliosis and thoracic spine degenerative changes with accentuated kyphosis of the thoracic spine. Cholecystectomy clips. IMPRESSION: No acute abnormality.  Stable cardiomegaly. Electronically Signed   By: Claudie Revering M.D.   On: 04/24/2017 20:49   Ct Head Wo Contrast  Result Date: 04/24/2017 CLINICAL DATA:  Fever and weakness EXAM: CT HEAD WITHOUT CONTRAST TECHNIQUE: Contiguous axial images were obtained from the base of the skull through the vertex without intravenous contrast. COMPARISON:  Head CT 07/26/2015 FINDINGS: Brain: No mass lesion, intraparenchymal hemorrhage or extra-axial collection. No evidence of acute cortical infarct. Unchanged old lacunar infarct versus prominent perivascular space of the left lentiform nucleus. Vascular: No hyperdense vessel or unexpected calcification. Skull: Normal visualized skull base, calvarium and extracranial soft tissues. Sinuses/Orbits: No sinus fluid levels or advanced mucosal thickening. No mastoid effusion. Normal orbits. IMPRESSION: No acute intracranial abnormality. Unchanged examination compared to 07/26/2015. Electronically Signed   By: Ulyses Jarred M.D.   On: 04/24/2017 22:56    Assessment/Plan 81 yo male with fever of unclear etiology and  generalized weakness  Principal Problem:   Fever- cxr neg.  ROS grossly negative.  ua with some blood but o/w not indicative of infection, it has been cultured however and pt will be given rocephin until cx data resulted.    Active Problems:   Weakness- likely due to fever, infection.  As above.  Obtain PT eval.  Ck orthostatics   Secondary cardiomyopathy ([place])- noted   Chronic systolic heart failure ([place])- holding lasix for now and giving gentle ivf   Atrial flutter (H[facility])- rate controlled   COPD (chronic obstructive pulmonary disease) (H[facility])- stable   CKD (chronic kidney disease)- at baseline cr of 2.2     DVT prophylaxis: scds  Code Status:  full Family Communication: none  Disposition Plan:  Per day team Consults called:  none Admission status:  observation   Nicollette Wilhelmi A MD [facility]  If 7PM-7AM, please contact night-coverage [email or web address] Password [email or web address]  04/25/2017, 12:02 AM

## 2017-04-25 NOTE — ED Notes (Signed)
EMS arrived to take pt home the same time pt's wife arrived with a family friend and family did not feel like pt was able to be discharged and wanted to speak with MD. Dr. Dewayne Hatch notified and at bedside to talk with family.

## 2017-04-26 DIAGNOSIS — I5022 Chronic systolic (congestive) heart failure: Secondary | ICD-10-CM | POA: Diagnosis not present

## 2017-04-26 DIAGNOSIS — R509 Fever, unspecified: Secondary | ICD-10-CM | POA: Diagnosis not present

## 2017-04-26 DIAGNOSIS — I429 Cardiomyopathy, unspecified: Secondary | ICD-10-CM | POA: Diagnosis not present

## 2017-04-26 DIAGNOSIS — J449 Chronic obstructive pulmonary disease, unspecified: Secondary | ICD-10-CM | POA: Diagnosis not present

## 2017-04-26 DIAGNOSIS — I4892 Unspecified atrial flutter: Secondary | ICD-10-CM | POA: Diagnosis not present

## 2017-04-26 LAB — URINE CULTURE: CULTURE: NO GROWTH

## 2017-04-26 MED ORDER — CEPHALEXIN 500 MG PO CAPS: 500.0000 mg | ORAL_CAPSULE | Freq: Two times a day (BID) | ORAL | 0 refills | Status: AC

## 2017-04-26 NOTE — Plan of Care (Signed)
Problem: Tissue Perfusion: Goal: Risk factors for ineffective tissue perfusion will decrease Outcome: Progressing Pt receiving eliquis for DVT prevention

## 2017-04-26 NOTE — Discharge Summary (Signed)
Physician Discharge Summary  [name]:[id number] DOB: 1935-12-09 DOA: 04/24/2017   Admit date: 04/24/2017 Discharge date: 04/26/2017  Discharge Diagnoses:  Principal Problem:   Fever Active Problems:   Secondary cardiomyopathy ([place])   Chronic systolic heart failure (HCC)   Atrial flutter (H[facility])   Weakness   COPD (chronic obstructive pulmonary disease) (H[facility])   CKD (chronic kidney disease)    Wt Readings from Last 3 Encounters:  04/25/17 154 lb (69.9 kg)  12/12/16 157 lb 6.4 oz (71.4 kg)  06/26/16 158 lb (71.7 kg)     Hospital Course:  This patient is a 81 year old male who presented with fever and weakness. Temperature was 102. He had an associated leukocytosis. The left lower extremity revealed redness, warmth and tenderness consistent with cellulitis. He had originally been given a dose of Rocephin in the emergency room. He was treated with Ancef. His leg significantly improved. His fever resolved. Leukocytosis resolved. He was much improved and stable for discharge on the morning of August 12. He will be seen in follow-up in my office in one week. Antibiotic therapy will be continued in the form of cephalexin twice a day.   Discharge Instructions  Discharge Instructions    Diet - low sodium heart healthy    Complete by:  As directed    Increase activity slowly    Complete by:  As directed      Allergies as of 04/26/2017      Reactions   No Known Allergies       Medication List    TAKE these medications   amiodarone 200 MG tablet Commonly known as:  PACERONE TAKE ONE TABLET BY MOUTH ONCE DAILY   apixaban 5 MG Tabs tablet Commonly known as:  ELIQUIS Take 1 tablet (5 mg total) by mouth 2 (two) times daily.   aspirin EC 81 MG tablet Take 81 mg by mouth daily.   carvedilol 3.125 MG tablet Commonly known as:  COREG TAKE ONE TABLET BY MOUTH TWICE DAILY WITH FOOD   cephALEXin 500 MG capsule Commonly known as:  KEFLEX Take 1 capsule (500 mg total) by  mouth 2 (two) times daily.   diclofenac sodium 1 % Gel Commonly known as:  VOLTAREN Apply 1 application topically 2 (two) times daily.   furosemide 40 MG tablet Commonly known as:  LASIX TAKE ONE TABLET BY MOUTH EVERY OTHER DAY MAY TAKE AN EXTRA TABLET IF WEIGHT GAIN OF 146 OR GREATER   hydrALAZINE 25 MG tablet Commonly known as:  APRESOLINE Take 1 tablet (25 mg total) by mouth 3 (three) times daily.   isosorbide mononitrate 30 MG 24 hr tablet Commonly known as:  IMDUR TAKE ONE TABLET BY MOUTH ONCE DAILY IN THE MORNING   levothyroxine 200 MCG tablet Commonly known as:  SYNTHROID, LEVOTHROID Take 200 mcg by mouth daily before breakfast.   loratadine 10 MG tablet Commonly known as:  CLARITIN Take 10 mg by mouth daily.   losartan 50 MG tablet Commonly known as:  COZAAR Take 1 tablet (50 mg total) by mouth daily.   spironolactone 25 MG tablet Commonly known as:  ALDACTONE TAKE ONE TABLET BY MOUTH ONCE DAILY        Kattia Selley 04/26/2017

## 2017-04-26 NOTE — Plan of Care (Signed)
Problem: Safety: Goal: Ability to remain free from injury will improve Outcome: Progressing Bed in low position, side rails up, call bell and personal items with in reach   

## 2017-04-26 NOTE — Progress Notes (Signed)
Discharge instructions gone over with patient and family, verbalized understanding. IV removed, patient tolerated procedure well.

## 2017-05-04 DIAGNOSIS — L03115 Cellulitis of right lower limb: Secondary | ICD-10-CM | POA: Diagnosis not present

## 2017-05-14 ENCOUNTER — Other Ambulatory Visit (HOSPITAL_COMMUNITY): Payer: Self-pay | Admitting: Adult Health

## 2017-05-31 ENCOUNTER — Other Ambulatory Visit (HOSPITAL_COMMUNITY): Payer: Self-pay | Admitting: Internal Medicine

## 2017-06-03 ENCOUNTER — Ambulatory Visit (HOSPITAL_COMMUNITY)
Admission: RE | Admit: 2017-06-03 | Discharge: 2017-06-03 | Disposition: A | Discharge status: Home / Self Care | Payer: Medicare HMO | Source: Ambulatory Visit | Attending: Internal Medicine | Admitting: Internal Medicine

## 2017-06-03 ENCOUNTER — Encounter (HOSPITAL_COMMUNITY): Payer: Self-pay | Admitting: Internal Medicine

## 2017-06-03 VITALS — BP 119/53 | HR 50 | Wt 158.0 lb

## 2017-06-03 DIAGNOSIS — I428 Other cardiomyopathies: Secondary | ICD-10-CM | POA: Insufficient documentation

## 2017-06-03 DIAGNOSIS — Z7901 Long term (current) use of anticoagulants: Secondary | ICD-10-CM | POA: Insufficient documentation

## 2017-06-03 DIAGNOSIS — I5022 Chronic systolic (congestive) heart failure: Secondary | ICD-10-CM

## 2017-06-03 DIAGNOSIS — I447 Left bundle-branch block, unspecified: Secondary | ICD-10-CM | POA: Diagnosis not present

## 2017-06-03 DIAGNOSIS — Z7982 Long term (current) use of aspirin: Secondary | ICD-10-CM | POA: Diagnosis not present

## 2017-06-03 DIAGNOSIS — M419 Scoliosis, unspecified: Secondary | ICD-10-CM | POA: Insufficient documentation

## 2017-06-03 DIAGNOSIS — E039 Hypothyroidism, unspecified: Secondary | ICD-10-CM | POA: Insufficient documentation

## 2017-06-03 DIAGNOSIS — I480 Paroxysmal atrial fibrillation: Secondary | ICD-10-CM

## 2017-06-03 DIAGNOSIS — N183 Chronic kidney disease, stage 3 unspecified: Secondary | ICD-10-CM

## 2017-06-03 DIAGNOSIS — J449 Chronic obstructive pulmonary disease, unspecified: Secondary | ICD-10-CM | POA: Insufficient documentation

## 2017-06-03 DIAGNOSIS — Z79899 Other long term (current) drug therapy: Secondary | ICD-10-CM | POA: Diagnosis not present

## 2017-06-03 DIAGNOSIS — E782 Mixed hyperlipidemia: Secondary | ICD-10-CM | POA: Diagnosis not present

## 2017-06-03 MED ORDER — FUROSEMIDE 40 MG PO TABS: 40.0000 mg | ORAL_TABLET | Freq: Every day | ORAL | 3 refills | Status: DC

## 2017-06-03 NOTE — Progress Notes (Signed)
[facility]URE [facility]  Patient ID: [name], male   DOB: [birth year], 81 y.o.   MRN: [id number]   ADVANCED HEART FAILURE NOTE PCP: Dr Willey Blade  HPI: [name] is a 81 y/o male with h/o cognitive impairment, renal insufficiency (baseline about 1.5-1.6), severe CHF due to NICM (EF 15-20%), atrial flutter (s/p DC-CV in April 2012) and LBBB. He and his family are not interested in ICD.   12/2010: Coronary angiograms showed insignifcant CAD. Right heart cath (on milrinone) showed a right atrial pressure of about 12, PA pressure was 45/79 with a mean of 27,pulmonary capillary wedge pressure was 12, LV pressure was 85/5 with an EDP of about 8.  Fick cardiac output was 4.4 with a cardiac index of 2.3.  Pulmonary artery saturations were 61 and 63%.  Pulmonary vascular resistance was 3.1 [place] units.   12/2010: TEE-guided cardioversion that showed an EF of about 15-20%.  There was no evidence of left atrial appendage thrombus.  He was successfully cardioverted with 1 shock.  He was started on amiodarone to help maintain sinus rhythm.Given his LBBB we discussed the possibility of BiV-ICD but decided to defer as he was doing so well.  He returns today with his brother for HF follow up. Admitted last month with LLE cellulitis. Now resolved. Doing well. Denies SOB. Walks though [employer] without difficulty.  fluid a day. Taking all medications as prescribed. Weight stable. Taking lasix every other day.    02/2011: ECHO with EF 27-03%, Grade 1 diastolic dysfunction, ventricular septum dyssynergy.  Mildly reduced RV systolic function.   06/28/12 ECHO EF 25-30% 11/2015: Echo EF 35%.   Labs (9/16): K 4.5 Creatinine 1.8 TC 144 TG 104 HDL 45 LDL 78 Labs (10/17) K 4.8, Creatinine 2.31.   ROS: All systems negative except as listed in HPI, PMH and Problem List.  Past Medical History:  Diagnosis Date  . Arthritis   . Cardiomyopathy    Nonischemic, LVEF 20%  . Chronic renal insufficiency   . COPD  (chronic obstructive pulmonary disease) ([place])   . Hypothyroidism   . Left bundle branch block   . Mixed hyperlipidemia   . Scoliosis     Current Outpatient Prescriptions  Medication Sig Dispense Refill  . amiodarone (PACERONE) 200 MG tablet TAKE ONE TABLET BY MOUTH ONCE DAILY 30 tablet 11  . apixaban (ELIQUIS) 5 MG TABS tablet Take 1 tablet (5 mg total) by mouth 2 (two) times daily. 60 tablet 3  . aspirin EC 81 MG tablet Take 81 mg by mouth daily.    . carvedilol (COREG) 3.125 MG tablet TAKE ONE TABLET BY MOUTH TWICE DAILY WITH FOOD 60 tablet 6  . diclofenac sodium (VOLTAREN) 1 % GEL Apply 1 application topically 2 (two) times daily.    . furosemide (LASIX) 40 MG tablet TAKE ONE TABLET BY MOUTH EVERY OTHER DAY MAY TAKE AN EXTRA IF WEIGHT GAIN OF 146 OR GREATER 40 tablet 3  . hydrALAZINE (APRESOLINE) 25 MG tablet Take 1 tablet (25 mg total) by mouth 3 (three) times daily. 90 tablet 11  . isosorbide mononitrate (IMDUR) 30 MG 24 hr tablet TAKE ONE TABLET BY MOUTH ONCE DAILY IN THE MORNING 30 tablet 6  . levothyroxine (SYNTHROID, LEVOTHROID) 200 MCG tablet Take 200 mcg by mouth daily before breakfast.    . loratadine (CLARITIN) 10 MG tablet Take 10 mg by mouth daily.    [name] losartan (COZAAR) 50 MG tablet TAKE 1 TABLET BY MOUTH ONCE DAILY *NEED OFFICE VISIT FOR  FUTURE REFILLS* 90 tablet 1  . spironolactone (ALDACTONE) 25 MG tablet TAKE ONE TABLET BY MOUTH ONCE DAILY 90 tablet 3   No current facility-administered medications for this encounter.      PHYSICAL EXAM: Vitals:   06/03/17 1042  BP: (!) 119/53  Pulse: (!) 50  SpO2: 98%  Weight: 158 lb (71.7 kg)    General: Well appearing male, severe kypho-scoliosis, brother present at visit.  HEENT: normal.  Neck: supple.JVP hard to see. Looks ok  Carotids 2+ bilaterally; no bruits. No lymphadenopathy or thryomegaly appreciated. Cor: PMI laterally displaced. Loletha Grayer regular. No m,r,g Lungs: Decreased throughout. But clear.  Abdomen: obese.  Soft NT/ND soft, nontender, nondistended. No hepatosplenomegaly. No bruits or masses. Good bowel sounds. Extremities: no cyanosis, clubbing, rash. 2+ edema with venous statis changes Neuro: alert & oriented x 3, cranial nerves grossly intact. moves all 4 extremities w/o difficulty. Affect pleasant   ASSESSMENT & PLAN:  1. Chronic systolic HF ECHO 0722  EF%. 25-30% Offered ICD in the past but he declined.  Echo 3/17 EF 35% - NYHA II.  - Volume status mildly elevated. We discussed increasing lasix to daily versus switching losartan to entresto. Hard for him to afford Entresto. Will increase lasix to 40 daily. BMET next week with Dr. Willey Blade.   - Continue carvedilol 3.125 mg twice a day - Continue hydralazine 25 mg tid/Imdur 30 mg daily  - Continue losartan 50 mg daily.(see Entresto discussion above) - Continue Spiro.  - Continue compression hose 2. AFL  - maintaining SR on amio. On apixaban. No bleeding 3. CKD, stage 3  - BMET today. 4. LBBB - stable   [name], Quillian Quince, MD 11:06 AM

## 2017-06-03 NOTE — Patient Instructions (Signed)
INCREASE Lasix to 40 mg (1 tab) once every morning.  Have labs drawn with Dr. Willey Blade in 2 weeks.  Follow up 6 months with Dr. Haroldine Laws. We will call you closer to this time, or you may call our office to schedule 1 month before you are due to be seen. Take all medication as prescribed the day of your appointment. Bring all medications with you to your appointment.  Do the following things EVERYDAY: 1) Weigh yourself in the morning before breakfast. Write it down and keep it in a log. 2) Take your medicines as prescribed 3) Eat low salt foods-Limit salt (sodium) to 2000 mg per day.  4) Stay as active as you can everyday 5) Limit all fluids for the day to less than 2 liters

## 2017-06-15 DIAGNOSIS — Z79899 Other long term (current) drug therapy: Secondary | ICD-10-CM | POA: Diagnosis not present

## 2017-06-15 DIAGNOSIS — N183 Chronic kidney disease, stage 3 (moderate): Secondary | ICD-10-CM | POA: Diagnosis not present

## 2017-06-15 DIAGNOSIS — I509 Heart failure, unspecified: Secondary | ICD-10-CM | POA: Diagnosis not present

## 2017-06-15 DIAGNOSIS — E039 Hypothyroidism, unspecified: Secondary | ICD-10-CM | POA: Diagnosis not present

## 2017-06-15 DIAGNOSIS — I447 Left bundle-branch block, unspecified: Secondary | ICD-10-CM | POA: Diagnosis not present

## 2017-06-17 ENCOUNTER — Telehealth (HOSPITAL_COMMUNITY): Payer: Self-pay | Admitting: Cardiology

## 2017-06-17 NOTE — Telephone Encounter (Signed)
Abnormal labs received Labs drawn 06/15/17 Cr 2.93\ k 5.0  Per VO Jettie Booze, NP Decrease lasix to every other day as previously dosed, repeat bmet x 2 weeks   Patient aware via his brother Francee Piccolo, order sent to PCP Willey Blade) for repeat BMET

## 2017-06-18 DIAGNOSIS — N184 Chronic kidney disease, stage 4 (severe): Secondary | ICD-10-CM | POA: Diagnosis not present

## 2017-06-18 DIAGNOSIS — I4892 Unspecified atrial flutter: Secondary | ICD-10-CM | POA: Diagnosis not present

## 2017-06-18 DIAGNOSIS — I5022 Chronic systolic (congestive) heart failure: Secondary | ICD-10-CM | POA: Diagnosis not present

## 2017-06-18 DIAGNOSIS — Z23 Encounter for immunization: Secondary | ICD-10-CM | POA: Diagnosis not present

## 2017-06-26 ENCOUNTER — Other Ambulatory Visit (HOSPITAL_COMMUNITY): Payer: Self-pay | Admitting: Internal Medicine

## 2017-07-01 ENCOUNTER — Other Ambulatory Visit (HOSPITAL_COMMUNITY): Payer: Self-pay | Admitting: *Deleted

## 2017-07-01 ENCOUNTER — Other Ambulatory Visit: Payer: Self-pay | Admitting: Internal Medicine

## 2017-07-01 DIAGNOSIS — I5022 Chronic systolic (congestive) heart failure: Secondary | ICD-10-CM | POA: Diagnosis not present

## 2017-07-02 LAB — BASIC METABOLIC PANEL
BUN/Creatinine Ratio: 16 (ref 10–24)
BUN: 43 mg/dL — AB (ref 8–27)
CALCIUM: 8.6 mg/dL (ref 8.6–10.2)
CO2: 22 mmol/L (ref 20–29)
CREATININE: 2.77 mg/dL — AB (ref 0.76–1.27)
Chloride: 105 mmol/L (ref 96–106)
GFR calc Af Amer: 24 mL/min/{1.73_m2} — ABNORMAL LOW (ref >59)
GFR, EST NON AFRICAN AMERICAN: 21 mL/min/{1.73_m2} — AB (ref >59)
GLUCOSE: 91 mg/dL (ref 65–99)
Potassium: 5.1 mmol/L (ref 3.5–5.2)
SODIUM: 140 mmol/L (ref 134–144)

## 2017-07-02 LAB — SPECIMEN STATUS REPORT

## 2017-07-16 DIAGNOSIS — E039 Hypothyroidism, unspecified: Secondary | ICD-10-CM | POA: Diagnosis not present

## 2017-07-16 DIAGNOSIS — N184 Chronic kidney disease, stage 4 (severe): Secondary | ICD-10-CM | POA: Diagnosis not present

## 2017-07-16 DIAGNOSIS — Z125 Encounter for screening for malignant neoplasm of prostate: Secondary | ICD-10-CM | POA: Diagnosis not present

## 2017-07-16 DIAGNOSIS — I5022 Chronic systolic (congestive) heart failure: Secondary | ICD-10-CM | POA: Diagnosis not present

## 2017-07-16 DIAGNOSIS — Z79899 Other long term (current) drug therapy: Secondary | ICD-10-CM | POA: Diagnosis not present

## 2017-07-16 DIAGNOSIS — M40204 Unspecified kyphosis, thoracic region: Secondary | ICD-10-CM | POA: Diagnosis not present

## 2017-07-16 DIAGNOSIS — I4892 Unspecified atrial flutter: Secondary | ICD-10-CM | POA: Diagnosis not present

## 2017-07-17 ENCOUNTER — Other Ambulatory Visit (HOSPITAL_COMMUNITY): Payer: Self-pay | Admitting: Internal Medicine

## 2017-07-20 ENCOUNTER — Encounter: Payer: Self-pay | Admitting: Internal Medicine

## 2017-07-20 DIAGNOSIS — Z0001 Encounter for general adult medical examination with abnormal findings: Secondary | ICD-10-CM | POA: Diagnosis not present

## 2017-07-20 DIAGNOSIS — I482 Chronic atrial fibrillation: Secondary | ICD-10-CM | POA: Diagnosis not present

## 2017-07-20 DIAGNOSIS — N184 Chronic kidney disease, stage 4 (severe): Secondary | ICD-10-CM | POA: Diagnosis not present

## 2017-07-20 DIAGNOSIS — Z6829 Body mass index (BMI) 29.0-29.9, adult: Secondary | ICD-10-CM | POA: Diagnosis not present

## 2017-07-20 DIAGNOSIS — I4892 Unspecified atrial flutter: Secondary | ICD-10-CM | POA: Diagnosis not present

## 2017-08-19 ENCOUNTER — Encounter (HOSPITAL_COMMUNITY): Payer: Self-pay

## 2017-08-19 ENCOUNTER — Other Ambulatory Visit: Payer: Self-pay

## 2017-08-19 ENCOUNTER — Emergency Department (HOSPITAL_COMMUNITY): Payer: Medicare HMO

## 2017-08-19 ENCOUNTER — Emergency Department (HOSPITAL_COMMUNITY)
Admission: EM | Admit: 2017-08-19 | Discharge: 2017-08-19 | Disposition: A | Discharge status: Home / Self Care | Payer: Medicare HMO | Attending: Emergency Medicine | Admitting: Emergency Medicine

## 2017-08-19 DIAGNOSIS — Z7982 Long term (current) use of aspirin: Secondary | ICD-10-CM | POA: Diagnosis not present

## 2017-08-19 DIAGNOSIS — N189 Chronic kidney disease, unspecified: Secondary | ICD-10-CM | POA: Insufficient documentation

## 2017-08-19 DIAGNOSIS — E039 Hypothyroidism, unspecified: Secondary | ICD-10-CM | POA: Diagnosis not present

## 2017-08-19 DIAGNOSIS — S93402A Sprain of unspecified ligament of left ankle, initial encounter: Secondary | ICD-10-CM | POA: Insufficient documentation

## 2017-08-19 DIAGNOSIS — Z79899 Other long term (current) drug therapy: Secondary | ICD-10-CM | POA: Diagnosis not present

## 2017-08-19 DIAGNOSIS — Y929 Unspecified place or not applicable: Secondary | ICD-10-CM | POA: Diagnosis not present

## 2017-08-19 DIAGNOSIS — S0990XA Unspecified injury of head, initial encounter: Secondary | ICD-10-CM | POA: Diagnosis not present

## 2017-08-19 DIAGNOSIS — S0081XA Abrasion of other part of head, initial encounter: Secondary | ICD-10-CM | POA: Insufficient documentation

## 2017-08-19 DIAGNOSIS — J449 Chronic obstructive pulmonary disease, unspecified: Secondary | ICD-10-CM | POA: Diagnosis not present

## 2017-08-19 DIAGNOSIS — M25472 Effusion, left ankle: Secondary | ICD-10-CM | POA: Diagnosis not present

## 2017-08-19 DIAGNOSIS — S42122A Displaced fracture of acromial process, left shoulder, initial encounter for closed fracture: Secondary | ICD-10-CM | POA: Diagnosis not present

## 2017-08-19 DIAGNOSIS — W19XXXA Unspecified fall, initial encounter: Secondary | ICD-10-CM

## 2017-08-19 DIAGNOSIS — S43102A Unspecified dislocation of left acromioclavicular joint, initial encounter: Secondary | ICD-10-CM | POA: Diagnosis not present

## 2017-08-19 DIAGNOSIS — X509XXA Other and unspecified overexertion or strenuous movements or postures, initial encounter: Secondary | ICD-10-CM | POA: Diagnosis not present

## 2017-08-19 DIAGNOSIS — S42032A Displaced fracture of lateral end of left clavicle, initial encounter for closed fracture: Secondary | ICD-10-CM | POA: Insufficient documentation

## 2017-08-19 DIAGNOSIS — M79672 Pain in left foot: Secondary | ICD-10-CM | POA: Diagnosis not present

## 2017-08-19 DIAGNOSIS — Y999 Unspecified external cause status: Secondary | ICD-10-CM | POA: Diagnosis not present

## 2017-08-19 DIAGNOSIS — Y939 Activity, unspecified: Secondary | ICD-10-CM | POA: Insufficient documentation

## 2017-08-19 DIAGNOSIS — M25512 Pain in left shoulder: Secondary | ICD-10-CM | POA: Diagnosis not present

## 2017-08-19 DIAGNOSIS — S42022A Displaced fracture of shaft of left clavicle, initial encounter for closed fracture: Secondary | ICD-10-CM | POA: Diagnosis not present

## 2017-08-19 DIAGNOSIS — S0181XA Laceration without foreign body of other part of head, initial encounter: Secondary | ICD-10-CM | POA: Diagnosis not present

## 2017-08-19 NOTE — ED Triage Notes (Signed)
Pt arriving by [facility]. States he was trying to get into a [employer] this evening and he fell/ got caught in the door. Once he got caught he fell. Is complaining of left sided shoulder pain, left foot pain as well as small laceration to left side of head. No loss of consciousness.   All VSS  CBG 114.

## 2017-08-19 NOTE — ED Provider Notes (Signed)
[facility] EMERGENCY DEPARTMENT Provider Note   CSN: [id number] Arrival date & time: 08/19/17  1828     History   Chief Complaint Chief Complaint  Patient presents with  . Fall    HPI [name] is a 81 y.o. male.  HPI Patient presents after a fall.  States that he was attempting to get in the [place] when his shoulder got caught and he fell down.  States that it happened very quickly and is not sure if he passed out.  He is on Eliquis.  No loss conscious.  No pain in his head but does have a bleeding area on his left temple area.  Complaining of pain in left shoulder left ankle.  No chest or abdominal pain.  No numbness or weakness.  No neck pain. Past Medical History:  Diagnosis Date  . Arthritis   . Cardiomyopathy    Nonischemic, LVEF 20%  . Chronic renal insufficiency   . COPD (chronic obstructive pulmonary disease) ([place])   . Hypothyroidism   . Left bundle branch block   . Mixed hyperlipidemia   . Scoliosis     Patient Active Problem List   Diagnosis Date Noted  . CKD (chronic kidney disease) 04/25/2017  . Fever 04/25/2017  . COPD (chronic obstructive pulmonary disease) ([place])   . Hematuria, microscopic   . Weakness 04/24/2017  . Long term (current) use of anticoagulants 01/31/2011  . Rash 01/13/2011  . Atrial flutter ([place]) 12/19/2010  . Chronic systolic heart failure ([place]) 12/02/2010  . Mixed hyperlipidemia 11/08/2010  . Secondary cardiomyopathy ([place]) 11/08/2010  . LBBB 11/08/2010  . ACUTE ON CHRONIC SYSTOLIC HEART FAILURE 09/47/0962    Past Surgical History:  Procedure Laterality Date  . EYE SURGERY    . LAPAROSCOPIC CHOLECYSTECTOMY  2008  . left foot surgery  1998  . MASS EXCISION Left 06/26/2016   Procedure: EXCISION Mucoid tumor;  Surgeon: Daryll Brod, MD;  Location: [place];  Service: Orthopedics;  Laterality: Left;  FAB  . Right inguinal herniorrhaphy    . ROTATION FLAP Left 06/26/2016   Procedure: debridement of distal interphalangeal possible,  ROTATION FLAP left index finger;  Surgeon: Daryll Brod, MD;  Location: [place];  Service: Orthopedics;  Laterality: Left;  . SKIN CANCER REMOVED  LEFT EAR         Home Medications    Prior to Admission medications   Medication Sig Start Date End Date Taking? Authorizing Provider  amiodarone (PACERONE) 200 MG tablet TAKE ONE TABLET BY MOUTH ONCE DAILY 01/06/17  Yes Bensimhon, Shaune Pascal, MD  apixaban (ELIQUIS) 5 MG TABS tablet Take 1 tablet (5 mg total) by mouth 2 (two) times daily. 04/07/17  Yes Bensimhon, Shaune Pascal, MD  aspirin EC 81 MG tablet Take 81 mg by mouth daily.   Yes [provider]  betamethasone dipropionate (DIPROLENE) 0.05 % cream Apply topically 2 (two) times daily.   Yes [provider]  carvedilol (COREG) 3.125 MG tablet TAKE ONE TABLET BY MOUTH TWICE DAILY WITH FOOD 02/02/17  Yes Clegg, Amy D, NP  furosemide (LASIX) 40 MG tablet Take 1 tablet (40 mg total) by mouth daily. Patient taking differently: Take 40 mg by mouth every other day.  06/03/17  Yes Bensimhon, Shaune Pascal, MD  hydrALAZINE (APRESOLINE) 25 MG tablet Take 1 tablet (25 mg total) by mouth 3 (three) times daily. 01/06/17  Yes Bensimhon, Shaune Pascal, MD  isosorbide mononitrate (IMDUR) 30 MG 24 hr tablet TAKE ONE TABLET BY MOUTH ONCE  DAILY IN THE MORNING 02/10/17  Yes Bensimhon, Shaune Pascal, MD  levothyroxine (SYNTHROID, LEVOTHROID) 175 MCG tablet Take 175 mcg by mouth daily before breakfast.    Yes [provider]  loratadine (CLARITIN) 10 MG tablet Take 10 mg by mouth daily.   Yes [provider]  losartan (COZAAR) 50 MG tablet TAKE 1 TABLET BY MOUTH ONCE DAILY *NEED OFFICE VISIT FOR FUTURE REFILLS* 06/01/17  Yes Bensimhon, Shaune Pascal, MD  spironolactone (ALDACTONE) 25 MG tablet TAKE ONE TABLET BY MOUTH ONCE DAILY 02/03/17  Yes Bensimhon, Shaune Pascal, MD    Family History Family History  Problem Relation Age of Onset  . Heart disease Father        Died age 47  . Stroke Mother        Died age 14     Social History Social History   Tobacco Use  . Smoking status: Never Smoker  . Smokeless tobacco: Never Used  Substance Use Topics  . Alcohol use: No  . Drug use: No     Allergies   No known allergies   Review of Systems Review of Systems  Constitutional: Negative for appetite change.  HENT: Negative for congestion.   Respiratory: Negative for shortness of breath.   Cardiovascular: Negative for chest pain.  Gastrointestinal: Negative for abdominal pain.  Genitourinary: Negative for flank pain.  Musculoskeletal:       Left shoulder left ankle pain  Neurological: Negative for numbness.  Hematological: Bruises/bleeds easily.  Psychiatric/Behavioral: Negative for self-injury.     Physical Exam Updated Vital Signs BP (!) 125/48 (BP Location: Right Arm)   Pulse (!) 48   Temp 97.9 F (36.6 C) (Oral)   Resp 15   Ht 5' (1.524 m)   Wt 72.6 kg (160 lb)   SpO2 94%   BMI 31.25 kg/m   Physical Exam  Constitutional: He appears well-developed.  HENT:  Head: Normocephalic.  Small area of abrasion on left temple area.  No active bleeding.  Eyes: EOM are normal.  Neck: Neck supple.  No midline tenderness.  Cardiovascular:  Regular bradycardia  Pulmonary/Chest: Effort normal. He exhibits no tenderness.  Abdominal: Soft. There is no tenderness.  Musculoskeletal: He exhibits tenderness.  Tenderness to left shoulder laterally.  Slightly decreased range of motion.  No deformity.  No tenderness over elbow or wrist.  Neurovascular intact in the hand.  No right upper extremity tenderness.  No tenderness over the hips.  No tenderness over her knees.  There is mild tenderness over the left ankle posteriorly.  No deformity.  Neurological: He is alert.  Skin: Skin is warm.     ED Treatments / Results  Labs (all labs ordered are listed, but only abnormal results are displayed) Labs Reviewed - No data to display  EKG  EKG Interpretation  Date/Time:  Wednesday August 19 2017 20:32:39 EST Ventricular Rate:  44 PR Interval:    QRS Duration: 155 QT Interval:  561 QTC Calculation: 480 R Axis:   69 Text Interpretation:  Sinus bradycardia IVCD, consider atypical LBBB Confirmed by Davonna Belling [phone]) on 08/19/2017 8:35:54 PM       Radiology Dg Clavicle Left  Result Date: 08/19/2017 CLINICAL DATA:  Status post fall with left shoulder pain. EXAM: LEFT CLAVICLE - 2+ VIEWS COMPARISON:  08/19/2017 FINDINGS: There is a transverse fracture through the distal left clavicle with overlap and inferior displacement of the distal fracture fragment. The acromioclavicular interval is widened to 6 mm, suggestive of AC separation.  IMPRESSION: Impacted displaced fracture of the distal clavicle with suspected AC separation. Electronically Signed   By: Fidela Salisbury M.D.   On: 08/19/2017 20:28   Dg Ac Joints  Result Date: 08/19/2017 CLINICAL DATA:  Fall today.  Left shoulder pain. EXAM: LEFT ACROMIOCLAVICULAR JOINTS COMPARISON:  Left shoulder radiographs of the same day. FINDINGS: The distal left clavicle fracture and AC separation is demonstrated. This is exaggerated with weight-bearing. The right AC joint is intact. The heart is enlarged. Lung volumes are low. Bibasilar airspace disease is worse on the left. IMPRESSION: 1. Left distal clavicle fracture and type III or type V AC separation. The coracoclavicular distance is borderline, but certainly exaggerated with weights. Electronically Signed   By: San Morelle M.D.   On: 08/19/2017 20:33   Dg Ankle Complete Left  Result Date: 08/19/2017 CLINICAL DATA:  [place] trying to get into expand or.  Left ankle pain. EXAM: LEFT ANKLE COMPLETE - 3+ VIEW COMPARISON:  None. FINDINGS: Remote plate screw fixation is present in the distal fibula. Medial malleolus screws are placed. Neck located. Ankle effusion is present. There is no acute fracture. IMPRESSION: 1. Ankle effusion without fracture. Ligamentous injury is not  excluded. 2. Remote ORIF. Electronically Signed   By: San Morelle M.D.   On: 08/19/2017 19:24   Ct Head Wo Contrast  Result Date: 08/19/2017 CLINICAL DATA:  Fall getting to of the hand. Laceration to left-sided head. No loss of consciousness. The patient is on anticoagulation. EXAM: CT HEAD WITHOUT CONTRAST TECHNIQUE: Contiguous axial images were obtained from the base of the skull through the vertex without intravenous contrast. COMPARISON:  CT head without contrast 04/24/2017. FINDINGS: Brain: Mild atrophy and white matter changes are similar the prior exam. No acute infarct, hemorrhage, or mass lesion is present. Ventricles are stable in size. The brainstem and cerebellum are normal. Vascular: Atherosclerotic calcifications are present within the cavernous internal carotid artery is bilaterally. There is no hyperdense vessel. Skull: Calvarium is intact. No acute or healing fractures are present. The no significant focal soft tissue injury is present. Sinuses/Orbits: The paranasal sinuses and mastoid air cells are clear. Globes and orbits are within normal limits bilaterally. IMPRESSION: 1. No acute intracranial abnormality or significant change. 2. Stable mild atrophy and white matter disease. 3. Atherosclerosis. Electronically Signed   By: San Morelle M.D.   On: 08/19/2017 19:28   Dg Shoulder Left  Result Date: 08/19/2017 CLINICAL DATA:  Fall trying to get into the [place] door. Landed on his left side. EXAM: LEFT SHOULDER - 2+ VIEW COMPARISON:  Two-view chest x-ray 04/24/2017. FINDINGS: The glenohumeral joint is intact. AC separation is suspected. A distal clavicle fracture is suspected. IMPRESSION: 1. Probable AC separation and fracture. Recommend clavicle radiographs with and without weight-bearing. Electronically Signed   By: San Morelle M.D.   On: 08/19/2017 19:23    Procedures Procedures (including critical care time)  Medications Ordered in ED Medications - No data to  display   Initial Impression / Assessment and Plan / ED Course  I have reviewed the triage vital signs and the nursing notes.  Pertinent labs & imaging results that were available during my care of the patient were reviewed by me and considered in my medical decision making (see chart for details).     Patient with fall.  Mechanical.  Left distal clavicle fracture and AC separation.  Also has a bradycardia, however this appears to be similar to what he has had in previous heart failure clinic.  Will discharge home.  Final Clinical Impressions(s) / ED Diagnoses   Final diagnoses:  Fall, initial encounter  Closed displaced fracture of acromial end of left clavicle, initial encounter  Separation of left acromioclavicular joint, initial encounter  Sprain of left ankle, unspecified ligament, initial encounter    ED Discharge Orders    None       Davonna Belling, MD 08/19/17 2346

## 2017-08-19 NOTE — Discharge Instructions (Addendum)
Follow with Dr. Aline Brochure for your shoulder and your ankle.  Follow-up with the heart failure clinic for your bradycardia.

## 2017-08-19 NOTE — ED Notes (Signed)
Pt has informed that they need to use the restroom. Have told pt to use a urinal and pt is refusing. Pt is demanding to walk to the bathroom

## 2017-08-21 ENCOUNTER — Other Ambulatory Visit (HOSPITAL_COMMUNITY): Payer: Self-pay | Admitting: Internal Medicine

## 2017-08-26 ENCOUNTER — Ambulatory Visit: Payer: Self-pay | Admitting: Orthopedic Surgery

## 2017-08-28 ENCOUNTER — Other Ambulatory Visit (HOSPITAL_COMMUNITY): Payer: Self-pay | Admitting: Adult Health

## 2017-08-28 ENCOUNTER — Ambulatory Visit: Payer: Medicare HMO | Admitting: Orthopedic Surgery

## 2017-08-28 VITALS — BP 121/71 | HR 55 | Ht 64.0 in | Wt 152.0 lb

## 2017-08-28 DIAGNOSIS — S93402A Sprain of unspecified ligament of left ankle, initial encounter: Secondary | ICD-10-CM

## 2017-08-28 DIAGNOSIS — IMO0001 Reserved for inherently not codable concepts without codable children: Secondary | ICD-10-CM

## 2017-08-28 DIAGNOSIS — S42035A Nondisplaced fracture of lateral end of left clavicle, initial encounter for closed fracture: Secondary | ICD-10-CM | POA: Diagnosis not present

## 2017-08-28 NOTE — Patient Instructions (Signed)
Wear sling until you come back wear a brace on the ankle until you come back

## 2017-08-28 NOTE — Progress Notes (Signed)
NEW PATIENT OFFICE VISIT    Chief Complaint  Patient presents with  . Follow-up    ER follow up on left clavicle fracture, DOI 08-19-17.    81 year old male with poor communication due to speech impediment including the person who is with him but from what I can gather his ankle was caught somehow he fell getting into a [place] he injured his left distal clavicle there is an apparent distal clavicle injury he also injured his left ankle complains of pain there.  Date of injury was December 5 is post injury day #9 he still has some pain and swelling decreased range of motion left shoulder and he is wearing an ASO brace with some pain which is mild in the left ankle associated with a chronic planovalgus rigid hindfoot 8  Location left shoulder left ankle quality dull severity mild left ankle moderate left shoulder duration 9 days timing constant left shoulder constant left ankle    Review of Systems  Constitutional: Negative for fever.  HENT: Negative for hearing loss.   Skin: Negative for rash.  Neurological: Negative for tingling and sensory change.     Past Medical History:  Diagnosis Date  . Arthritis   . Cardiomyopathy    Nonischemic, LVEF 20%  . Chronic renal insufficiency   . COPD (chronic obstructive pulmonary disease) ([place])   . Hypothyroidism   . Left bundle branch block   . Mixed hyperlipidemia   . Scoliosis     Past Surgical History:  Procedure Laterality Date  . EYE SURGERY    . LAPAROSCOPIC CHOLECYSTECTOMY  2008  . left foot surgery  1998  . MASS EXCISION Left 06/26/2016   Procedure: EXCISION Mucoid tumor;  Surgeon: Daryll Brod, MD;  Location: [place];  Service: Orthopedics;  Laterality: Left;  FAB  . Right inguinal herniorrhaphy    . ROTATION FLAP Left 06/26/2016   Procedure: debridement of distal interphalangeal possible, ROTATION FLAP left index finger;  Surgeon: Daryll Brod, MD;  Location: [place];  Service: Orthopedics;  Laterality: Left;  . SKIN CANCER REMOVED   LEFT EAR      Family History  Problem Relation Age of Onset  . Heart disease Father        Died age 31  . Stroke Mother        Died age 5   Social History   Tobacco Use  . Smoking status: Never Smoker  . Smokeless tobacco: Never Used  Substance Use Topics  . Alcohol use: No  . Drug use: No    Allergies  Allergen Reactions  . No Known Allergies      No outpatient medications have been marked as taking for the 08/28/17 encounter (Office Visit) with Carole Civil, MD.    BP 121/71   Pulse (!) 55   Ht 5\' 4"  (1.626 m)   Wt 152 lb (68.9 kg)   BMI 26.09 kg/m   Physical Exam  Constitutional: Vital signs are normal. He is cooperative.  Non-toxic appearance. He does not have a sickly appearance. He does not appear ill. No distress.    Musculoskeletal:       Left ankle: He exhibits decreased range of motion and deformity. He exhibits no swelling, no ecchymosis, no laceration and normal pulse. Tenderness. AITFL tenderness found. No lateral malleolus, no medial malleolus, no CF ligament, no posterior TFL, no head of 5th metatarsal and no proximal fibula tenderness found. Achilles tendon exhibits no pain, no defect and normal Thompson's test results.  Feet:  Neurological: He is alert.  Skin: Skin is warm and dry. Capillary refill takes less than 2 seconds. No rash noted. There is erythema. No pallor.  Psychiatric: He has a normal mood and affect. His behavior is normal. Thought content normal.    Ortho Exam   Right shoulder looks normal no tenderness decreased range of motion is chronic there is no instability muscle tone is normal skin is intact pulses are good sensation is normal and there is no lymphadenopathy  Left shoulder tenderness over the distal clavicle with ecchymosis around the skin decreased range of motion neurovascular exam is intact cervical adenopathy is negative normal muscle tone    Encounter Diagnoses  Name Primary?  . Closed nondisplaced  fracture of acromial end of left clavicle, initial encounter Yes  . Grade 1 ankle sprain, left, initial encounter      PLAN:   Sling for 3-4 weeks x-ray when he comes back for the distal clavicle fracture which is nondisplaced.  ASO brace until he comes back no x-ray needed of that

## 2017-09-06 ENCOUNTER — Other Ambulatory Visit (HOSPITAL_COMMUNITY): Payer: Self-pay | Admitting: Internal Medicine

## 2017-09-24 DIAGNOSIS — S42002A Fracture of unspecified part of left clavicle, initial encounter for closed fracture: Secondary | ICD-10-CM | POA: Insufficient documentation

## 2017-09-25 ENCOUNTER — Ambulatory Visit (INDEPENDENT_AMBULATORY_CARE_PROVIDER_SITE_OTHER): Payer: Self-pay

## 2017-09-25 ENCOUNTER — Encounter: Payer: Self-pay | Admitting: Orthopedic Surgery

## 2017-09-25 ENCOUNTER — Ambulatory Visit (INDEPENDENT_AMBULATORY_CARE_PROVIDER_SITE_OTHER): Payer: Medicare HMO | Admitting: Orthopedic Surgery

## 2017-09-25 VITALS — BP 89/49 | HR 51 | Ht 64.0 in | Wt 152.0 lb

## 2017-09-25 DIAGNOSIS — S42035D Nondisplaced fracture of lateral end of left clavicle, subsequent encounter for fracture with routine healing: Secondary | ICD-10-CM

## 2017-09-25 DIAGNOSIS — IMO0001 Reserved for inherently not codable concepts without codable children: Secondary | ICD-10-CM

## 2017-09-25 DIAGNOSIS — S93402D Sprain of unspecified ligament of left ankle, subsequent encounter: Secondary | ICD-10-CM

## 2017-09-25 NOTE — Progress Notes (Signed)
Fracture care follow-up  Chief Complaint  Patient presents with  . Clavicle Injury    s/p fracture 08/19/17 patient improving     Encounter Diagnosis  Name Primary?  . Closed nondisplaced fracture of acromial end of left clavicle with routine healing, subsequent encounter 08/19/17     72 male fractured his left clavicle distal aspect grade 1 ankle sprain as well presents for fracture care follow-up x-ray  It is now 5 weeks post injury he is improving     Current Outpatient Medications:  .  amiodarone (PACERONE) 200 MG tablet, TAKE ONE TABLET BY MOUTH ONCE DAILY, Disp: 30 tablet, Rfl: 11 .  aspirin EC 81 MG tablet, Take 81 mg by mouth daily., Disp: , Rfl:  .  betamethasone dipropionate (DIPROLENE) 0.05 % cream, Apply topically 2 (two) times daily., Disp: , Rfl:  .  carvedilol (COREG) 3.125 MG tablet, TAKE 1 TABLET BY MOUTH TWICE DAILY WITH FOOD, Disp: 60 tablet, Rfl: 6 .  ELIQUIS 5 MG TABS tablet, TAKE 1 TABLET BY MOUTH TWICE DAILY, Disp: 60 tablet, Rfl: 3 .  furosemide (LASIX) 40 MG tablet, Take 1 tablet (40 mg total) by mouth daily. (Patient taking differently: Take 40 mg by mouth every other day. ), Disp: 90 tablet, Rfl: 3 .  hydrALAZINE (APRESOLINE) 25 MG tablet, Take 1 tablet (25 mg total) by mouth 3 (three) times daily., Disp: 90 tablet, Rfl: 11 .  isosorbide mononitrate (IMDUR) 30 MG 24 hr tablet, TAKE 1 TABLET BY MOUTH ONCE DAILY IN THE MORNING, Disp: 30 tablet, Rfl: 6 .  levothyroxine (SYNTHROID, LEVOTHROID) 175 MCG tablet, Take 175 mcg by mouth daily before breakfast. , Disp: , Rfl:  .  loratadine (CLARITIN) 10 MG tablet, Take 10 mg by mouth daily., Disp: , Rfl:  .  losartan (COZAAR) 50 MG tablet, TAKE 1 TABLET BY MOUTH ONCE DAILY *NEED OFFICE VISIT FOR FUTURE REFILLS*, Disp: 90 tablet, Rfl: 1 .  spironolactone (ALDACTONE) 25 MG tablet, TAKE ONE TABLET BY MOUTH ONCE DAILY, Disp: 90 tablet, Rfl: 3  BP (!) 89/49   Pulse (!) 51   Ht 5\' 4"  (1.626 m)   Wt 152 lb (68.9 kg)   BMI  26.09 kg/m   Physical Exam  Patient is exhibits independent forward elevation skin is normal  Ankle checked out normal he has chronic planovalgus foot   Xrays: See the dictated report no change in fracture position   Encounter Diagnoses  Name Primary?  . Closed nondisplaced fracture of acromial end of left clavicle with routine healing, subsequent encounter 08/19/17    . Grade 1 ankle sprain, left, subsequent encounter Yes    Plan  Normal active range of motion as tolerated   Remove sling and ankle brace

## 2017-10-04 DIAGNOSIS — R531 Weakness: Secondary | ICD-10-CM | POA: Diagnosis not present

## 2017-10-04 DIAGNOSIS — Z7982 Long term (current) use of aspirin: Secondary | ICD-10-CM | POA: Diagnosis not present

## 2017-10-04 DIAGNOSIS — R0602 Shortness of breath: Secondary | ICD-10-CM | POA: Diagnosis not present

## 2017-10-04 DIAGNOSIS — I517 Cardiomegaly: Secondary | ICD-10-CM | POA: Diagnosis not present

## 2017-10-04 DIAGNOSIS — Z79899 Other long term (current) drug therapy: Secondary | ICD-10-CM | POA: Diagnosis not present

## 2017-10-04 DIAGNOSIS — E860 Dehydration: Secondary | ICD-10-CM | POA: Diagnosis not present

## 2017-10-04 DIAGNOSIS — R05 Cough: Secondary | ICD-10-CM | POA: Diagnosis not present

## 2017-10-04 DIAGNOSIS — R42 Dizziness and giddiness: Secondary | ICD-10-CM | POA: Diagnosis not present

## 2017-10-04 DIAGNOSIS — I509 Heart failure, unspecified: Secondary | ICD-10-CM | POA: Diagnosis not present

## 2017-10-04 DIAGNOSIS — Z7902 Long term (current) use of antithrombotics/antiplatelets: Secondary | ICD-10-CM | POA: Diagnosis not present

## 2017-10-04 DIAGNOSIS — R0902 Hypoxemia: Secondary | ICD-10-CM | POA: Diagnosis not present

## 2017-10-04 DIAGNOSIS — R404 Transient alteration of awareness: Secondary | ICD-10-CM | POA: Diagnosis not present

## 2017-10-16 ENCOUNTER — Emergency Department (HOSPITAL_COMMUNITY): Payer: Medicare HMO

## 2017-10-16 ENCOUNTER — Observation Stay (HOSPITAL_COMMUNITY)
Admission: EM | Admit: 2017-10-16 | Discharge: 2017-10-18 | Disposition: A | Discharge status: Home / Self Care | Payer: Medicare HMO | Attending: Family Medicine | Admitting: Family Medicine

## 2017-10-16 ENCOUNTER — Other Ambulatory Visit: Payer: Self-pay

## 2017-10-16 ENCOUNTER — Encounter (HOSPITAL_COMMUNITY): Payer: Self-pay | Admitting: Emergency Medicine

## 2017-10-16 DIAGNOSIS — R0602 Shortness of breath: Secondary | ICD-10-CM | POA: Diagnosis not present

## 2017-10-16 DIAGNOSIS — I429 Cardiomyopathy, unspecified: Secondary | ICD-10-CM

## 2017-10-16 DIAGNOSIS — J449 Chronic obstructive pulmonary disease, unspecified: Secondary | ICD-10-CM | POA: Diagnosis present

## 2017-10-16 DIAGNOSIS — Z79899 Other long term (current) drug therapy: Secondary | ICD-10-CM | POA: Diagnosis not present

## 2017-10-16 DIAGNOSIS — R0789 Other chest pain: Secondary | ICD-10-CM | POA: Diagnosis present

## 2017-10-16 DIAGNOSIS — I4891 Unspecified atrial fibrillation: Secondary | ICD-10-CM | POA: Insufficient documentation

## 2017-10-16 DIAGNOSIS — M25511 Pain in right shoulder: Secondary | ICD-10-CM

## 2017-10-16 DIAGNOSIS — R7989 Other specified abnormal findings of blood chemistry: Secondary | ICD-10-CM | POA: Diagnosis not present

## 2017-10-16 DIAGNOSIS — N184 Chronic kidney disease, stage 4 (severe): Secondary | ICD-10-CM | POA: Diagnosis present

## 2017-10-16 DIAGNOSIS — I130 Hypertensive heart and chronic kidney disease with heart failure and stage 1 through stage 4 chronic kidney disease, or unspecified chronic kidney disease: Secondary | ICD-10-CM | POA: Diagnosis not present

## 2017-10-16 DIAGNOSIS — R778 Other specified abnormalities of plasma proteins: Secondary | ICD-10-CM | POA: Diagnosis present

## 2017-10-16 DIAGNOSIS — I5022 Chronic systolic (congestive) heart failure: Secondary | ICD-10-CM | POA: Diagnosis not present

## 2017-10-16 DIAGNOSIS — Z7982 Long term (current) use of aspirin: Secondary | ICD-10-CM | POA: Insufficient documentation

## 2017-10-16 DIAGNOSIS — E039 Hypothyroidism, unspecified: Secondary | ICD-10-CM | POA: Insufficient documentation

## 2017-10-16 DIAGNOSIS — R079 Chest pain, unspecified: Secondary | ICD-10-CM | POA: Diagnosis present

## 2017-10-16 DIAGNOSIS — Z7901 Long term (current) use of anticoagulants: Secondary | ICD-10-CM

## 2017-10-16 DIAGNOSIS — R531 Weakness: Secondary | ICD-10-CM

## 2017-10-16 NOTE — ED Triage Notes (Signed)
Patient has mid-sternal chest pain that started around 1030 pm tonight, radiating into right shoulder, with shortness of breath.

## 2017-10-17 ENCOUNTER — Encounter (HOSPITAL_COMMUNITY): Payer: Self-pay | Admitting: Family Medicine

## 2017-10-17 ENCOUNTER — Other Ambulatory Visit: Payer: Self-pay

## 2017-10-17 DIAGNOSIS — I5022 Chronic systolic (congestive) heart failure: Secondary | ICD-10-CM | POA: Diagnosis not present

## 2017-10-17 DIAGNOSIS — R531 Weakness: Secondary | ICD-10-CM

## 2017-10-17 DIAGNOSIS — R7989 Other specified abnormal findings of blood chemistry: Secondary | ICD-10-CM | POA: Diagnosis present

## 2017-10-17 DIAGNOSIS — R0789 Other chest pain: Secondary | ICD-10-CM | POA: Diagnosis present

## 2017-10-17 DIAGNOSIS — R079 Chest pain, unspecified: Secondary | ICD-10-CM | POA: Diagnosis not present

## 2017-10-17 DIAGNOSIS — R778 Other specified abnormalities of plasma proteins: Secondary | ICD-10-CM | POA: Diagnosis present

## 2017-10-17 DIAGNOSIS — Z7901 Long term (current) use of anticoagulants: Secondary | ICD-10-CM

## 2017-10-17 DIAGNOSIS — R0602 Shortness of breath: Secondary | ICD-10-CM | POA: Diagnosis not present

## 2017-10-17 DIAGNOSIS — J449 Chronic obstructive pulmonary disease, unspecified: Secondary | ICD-10-CM | POA: Diagnosis not present

## 2017-10-17 DIAGNOSIS — N184 Chronic kidney disease, stage 4 (severe): Secondary | ICD-10-CM | POA: Diagnosis present

## 2017-10-17 DIAGNOSIS — M25511 Pain in right shoulder: Secondary | ICD-10-CM | POA: Diagnosis not present

## 2017-10-17 DIAGNOSIS — I429 Cardiomyopathy, unspecified: Secondary | ICD-10-CM | POA: Diagnosis not present

## 2017-10-17 DIAGNOSIS — R748 Abnormal levels of other serum enzymes: Secondary | ICD-10-CM

## 2017-10-17 LAB — HEPATIC FUNCTION PANEL
ALK PHOS: 82 U/L (ref 38–126)
ALT: 20 U/L (ref 17–63)
AST: 23 U/L (ref 15–41)
Albumin: 4 g/dL (ref 3.5–5.0)
BILIRUBIN DIRECT: 0.1 mg/dL (ref 0.1–0.5)
BILIRUBIN TOTAL: 0.5 mg/dL (ref 0.3–1.2)
Indirect Bilirubin: 0.4 mg/dL (ref 0.3–0.9)
Total Protein: 8 g/dL (ref 6.5–8.1)

## 2017-10-17 LAB — D-DIMER, QUANTITATIVE: D-Dimer, Quant: 0.38 ug/mL-FEU (ref 0.00–0.50)

## 2017-10-17 LAB — CBC
HCT: 40.7 % (ref 39.0–52.0)
Hemoglobin: 12.9 g/dL — ABNORMAL LOW (ref 13.0–17.0)
MCH: 32.5 pg (ref 26.0–34.0)
MCHC: 31.7 g/dL (ref 30.0–36.0)
MCV: 102.5 fL — AB (ref 78.0–100.0)
PLATELETS: 194 10*3/uL (ref 150–400)
RBC: 3.97 MIL/uL — ABNORMAL LOW (ref 4.22–5.81)
RDW: 12.6 % (ref 11.5–15.5)
WBC: 9.6 10*3/uL (ref 4.0–10.5)

## 2017-10-17 LAB — BASIC METABOLIC PANEL
Anion gap: 8 (ref 5–15)
BUN: 42 mg/dL — AB (ref 6–20)
CO2: 26 mmol/L (ref 22–32)
CREATININE: 2.42 mg/dL — AB (ref 0.61–1.24)
Calcium: 9 mg/dL (ref 8.9–10.3)
Chloride: 106 mmol/L (ref 101–111)
GFR calc Af Amer: 28 mL/min — ABNORMAL LOW (ref >60)
GFR, EST NON AFRICAN AMERICAN: 24 mL/min — AB (ref >60)
Glucose, Bld: 108 mg/dL — ABNORMAL HIGH (ref 65–99)
Potassium: 4.7 mmol/L (ref 3.5–5.1)
SODIUM: 140 mmol/L (ref 135–145)

## 2017-10-17 LAB — I-STAT TROPONIN, ED
TROPONIN I, POC: 0.07 ng/mL (ref 0.00–0.08)
Troponin i, poc: 0.03 ng/mL (ref 0.00–0.08)
Troponin i, poc: 0.09 ng/mL (ref 0.00–0.08)
Troponin i, poc: 0.11 ng/mL (ref 0.00–0.08)

## 2017-10-17 LAB — TROPONIN I
TROPONIN I: 0.1 ng/mL — AB (ref <0.03)
Troponin I: 0.08 ng/mL (ref <0.03)
Troponin I: 0.07 ng/mL (ref <0.03)

## 2017-10-17 LAB — LIPASE, BLOOD: Lipase: 84 U/L — ABNORMAL HIGH (ref 11–51)

## 2017-10-17 LAB — BRAIN NATRIURETIC PEPTIDE: B NATRIURETIC PEPTIDE 5: 117 pg/mL — AB (ref 0.0–100.0)

## 2017-10-17 MED ORDER — ASPIRIN 81 MG PO CHEW
324.0000 mg | CHEWABLE_TABLET | Freq: Once | ORAL | Status: AC
Start: 2017-10-17 — End: 2017-10-17
  Administered 2017-10-17: 324 mg via ORAL
  Filled 2017-10-17: qty 4

## 2017-10-17 MED ORDER — SODIUM CHLORIDE 0.9 % IV BOLUS (SEPSIS)
500.0000 mL | Freq: Once | INTRAVENOUS | Status: AC
  Administered 2017-10-17: 500 mL via INTRAVENOUS

## 2017-10-17 MED ORDER — APIXABAN 5 MG PO TABS: 5.0000 mg | ORAL_TABLET | Freq: Two times a day (BID) | ORAL | Status: DC

## 2017-10-17 MED ORDER — APIXABAN 2.5 MG PO TABS: 2.5000 mg | ORAL_TABLET | Freq: Two times a day (BID) | ORAL | Status: DC

## 2017-10-17 MED ORDER — ISOSORBIDE MONONITRATE ER 60 MG PO TB24
30.0000 mg | ORAL_TABLET | Freq: Every day | ORAL | Status: DC
  Administered 2017-10-17 – 2017-10-18 (×2): 30 mg via ORAL
  Filled 2017-10-17 (×2): qty 1

## 2017-10-17 MED ORDER — LOSARTAN POTASSIUM 50 MG PO TABS
50.0000 mg | ORAL_TABLET | Freq: Every day | ORAL | Status: DC
  Administered 2017-10-17 – 2017-10-18 (×2): 50 mg via ORAL
  Filled 2017-10-17 (×2): qty 1

## 2017-10-17 MED ORDER — SODIUM CHLORIDE 0.9 % IV BOLUS (SEPSIS): 1000.0000 mL | Freq: Once | INTRAVENOUS | Status: DC

## 2017-10-17 MED ORDER — LEVOTHYROXINE SODIUM 75 MCG PO TABS
175.0000 ug | ORAL_TABLET | Freq: Every day | ORAL | Status: DC
  Administered 2017-10-17 – 2017-10-18 (×2): 175 ug via ORAL
  Filled 2017-10-17 (×2): qty 1

## 2017-10-17 MED ORDER — CARVEDILOL 3.125 MG PO TABS
3.1250 mg | ORAL_TABLET | Freq: Two times a day (BID) | ORAL | Status: DC
  Administered 2017-10-17: 3.125 mg via ORAL
  Filled 2017-10-17 (×2): qty 1

## 2017-10-17 MED ORDER — AMIODARONE HCL 200 MG PO TABS
200.0000 mg | ORAL_TABLET | Freq: Every day | ORAL | Status: DC
  Administered 2017-10-17 – 2017-10-18 (×2): 200 mg via ORAL
  Filled 2017-10-17 (×2): qty 1

## 2017-10-17 MED ORDER — ACETAMINOPHEN 325 MG PO TABS: 650.0000 mg | ORAL_TABLET | ORAL | Status: DC | PRN

## 2017-10-17 MED ORDER — APIXABAN 5 MG PO TABS
5.0000 mg | ORAL_TABLET | Freq: Two times a day (BID) | ORAL | Status: DC
  Administered 2017-10-17 – 2017-10-18 (×3): 5 mg via ORAL
  Filled 2017-10-17: qty 2
  Filled 2017-10-17: qty 1

## 2017-10-17 MED ORDER — MORPHINE SULFATE (PF) 2 MG/ML IV SOLN: 1.0000 mg | INTRAVENOUS | Status: DC | PRN

## 2017-10-17 MED ORDER — FUROSEMIDE 40 MG PO TABS
40.0000 mg | ORAL_TABLET | ORAL | Status: DC
  Administered 2017-10-18: 40 mg via ORAL
  Filled 2017-10-17: qty 1

## 2017-10-17 MED ORDER — SPIRONOLACTONE 25 MG PO TABS
25.0000 mg | ORAL_TABLET | Freq: Every day | ORAL | Status: DC
  Administered 2017-10-17 – 2017-10-18 (×2): 25 mg via ORAL
  Filled 2017-10-17 (×2): qty 1

## 2017-10-17 MED ORDER — HYDRALAZINE HCL 25 MG PO TABS
25.0000 mg | ORAL_TABLET | Freq: Three times a day (TID) | ORAL | Status: DC
  Administered 2017-10-17 – 2017-10-18 (×4): 25 mg via ORAL
  Filled 2017-10-17 (×4): qty 1

## 2017-10-17 MED ORDER — APIXABAN 5 MG PO TABS
5.0000 mg | ORAL_TABLET | Freq: Two times a day (BID) | ORAL | Status: DC
  Filled 2017-10-17: qty 1

## 2017-10-17 MED ORDER — ASPIRIN EC 81 MG PO TBEC
81.0000 mg | DELAYED_RELEASE_TABLET | Freq: Every day | ORAL | Status: DC
  Administered 2017-10-18: 81 mg via ORAL
  Filled 2017-10-17: qty 1

## 2017-10-17 MED ORDER — PANTOPRAZOLE SODIUM 40 MG PO TBEC
40.0000 mg | DELAYED_RELEASE_TABLET | Freq: Every day | ORAL | Status: DC
  Administered 2017-10-17: 40 mg via ORAL
  Filled 2017-10-17: qty 1

## 2017-10-17 MED ORDER — ONDANSETRON HCL 4 MG/2ML IJ SOLN: 4.0000 mg | Freq: Four times a day (QID) | INTRAMUSCULAR | Status: DC | PRN

## 2017-10-17 MED ORDER — ACETAMINOPHEN 325 MG PO TABS
650.0000 mg | ORAL_TABLET | Freq: Once | ORAL | Status: AC
  Administered 2017-10-17: 650 mg via ORAL
  Filled 2017-10-17: qty 2

## 2017-10-17 NOTE — Progress Notes (Signed)
CRITICAL VALUE ALERT  Critical Value:  Troponin 0.10  Date & Time Notied:  10/24/17 1150  Provider Notified: Dr. Wynetta Emery  Orders Received/Actions taken: Text-paged Dr. Wynetta Emery to notify of troponin level.

## 2017-10-17 NOTE — H&P (Signed)
History and Physical  [name] WUJ:[id number] DOB: [birth year] DOA: 10/16/2017  Referring physician: Tomi Bamberger PCP: Asencion Noble, MD   Chief Complaint: right shoulder pain   HPI: [name] is a 82 y.o. male with stage IV CKD, chronic systolic NYHA II HF ECHO 9562  EF%. 35% Offered ICD in the past but he declined presented to the ED with complaints of sudden onset of right shoulder pain that occurred around 10:30 PM last night.  He said that the pain in the right shoulder radiated into the right side of the chest as well.  He denies any falls or trauma.  He does have a history of a left clavicle fracture from 1 month ago after a fall but has since been cleared by orthopedics.  He has chronic shortness of breath from COPD but that has not changed.  He denies nausea vomiting, fever and chills.  He denies abdominal pain.  He denies weakness, focal weakness numbness and tingling.  He was seen in the ED and monitored.  There are no acute EKG changes noted.  He had some serial troponins.  There was a small trend of his troponin rising.  His troponin went from 0.03-0.11.  He is being admitted for further observation and to trend troponins to rule out an acute ischemic event.  According to records from his cardiologist his coronary angiograms showed insignificant CAD.  He is followed in the advanced heart failure clinic.  Review of Systems: All systems reviewed and apart from history of presenting illness, are negative.  Past Medical History:  Diagnosis Date  . Arthritis   . Cardiomyopathy    Nonischemic, LVEF 20%  . Chronic renal insufficiency   . COPD (chronic obstructive pulmonary disease) ([place])   . Hypothyroidism   . Left bundle branch block   . Mixed hyperlipidemia   . Scoliosis    Past Surgical History:  Procedure Laterality Date  . EYE SURGERY    . LAPAROSCOPIC CHOLECYSTECTOMY  2008  . left foot surgery  1998  . MASS EXCISION Left 06/26/2016   Procedure: EXCISION Mucoid tumor;   Surgeon: Daryll Brod, MD;  Location: [place];  Service: Orthopedics;  Laterality: Left;  FAB  . Right inguinal herniorrhaphy    . ROTATION FLAP Left 06/26/2016   Procedure: debridement of distal interphalangeal possible, ROTATION FLAP left index finger;  Surgeon: Daryll Brod, MD;  Location: [place];  Service: Orthopedics;  Laterality: Left;  . SKIN CANCER REMOVED  LEFT EAR     Social History:  reports that  has never smoked. he has never used smokeless tobacco. He reports that he does not drink alcohol or use drugs.  Allergies  Allergen Reactions  . No Known Allergies     Family History  Problem Relation Age of Onset  . Heart disease Father        Died age 49  . Stroke Mother        Died age 54    Prior to Admission medications   Medication Sig Start Date End Date Taking? Authorizing Provider  amiodarone (PACERONE) 200 MG tablet TAKE ONE TABLET BY MOUTH ONCE DAILY 01/06/17   Bensimhon, Shaune Pascal, MD  aspirin EC 81 MG tablet Take 81 mg by mouth daily.    [provider]  betamethasone dipropionate (DIPROLENE) 0.05 % cream Apply topically 2 (two) times daily.    [provider]  carvedilol (COREG) 3.125 MG tablet TAKE 1 TABLET BY MOUTH TWICE DAILY WITH FOOD  08/28/17   Larey Dresser, MD  ELIQUIS 5 MG TABS tablet TAKE 1 TABLET BY MOUTH TWICE DAILY 08/21/17   Bensimhon, Shaune Pascal, MD  furosemide (LASIX) 40 MG tablet Take 1 tablet (40 mg total) by mouth daily. Patient taking differently: Take 40 mg by mouth every other day.  06/03/17   Bensimhon, Shaune Pascal, MD  hydrALAZINE (APRESOLINE) 25 MG tablet Take 1 tablet (25 mg total) by mouth 3 (three) times daily. 01/06/17   Bensimhon, Shaune Pascal, MD  isosorbide mononitrate (IMDUR) 30 MG 24 hr tablet TAKE 1 TABLET BY MOUTH ONCE DAILY IN THE MORNING 09/10/17   Bensimhon, Shaune Pascal, MD  levothyroxine (SYNTHROID, LEVOTHROID) 175 MCG tablet Take 175 mcg by mouth daily before breakfast.     [provider]  loratadine (CLARITIN) 10 MG  tablet Take 10 mg by mouth daily.    [provider]  losartan (COZAAR) 50 MG tablet TAKE 1 TABLET BY MOUTH ONCE DAILY *NEED OFFICE VISIT FOR FUTURE REFILLS* 06/01/17   Bensimhon, Shaune Pascal, MD  spironolactone (ALDACTONE) 25 MG tablet TAKE ONE TABLET BY MOUTH ONCE DAILY 02/03/17   Bensimhon, Shaune Pascal, MD   Physical Exam: Vitals:   10/17/17 0730 10/17/17 0803 10/17/17 0830 10/17/17 0918  BP: (!) 107/52 (!) 127/50 (!) 112/47 (!) 109/42  Pulse: (!) 53 61 (!) 56 62  Resp: 17 18 18 19   Temp:    97.6 F (36.4 C)  TempSrc:    Oral  SpO2: 96% 98% 94%   Weight:    69.2 kg (152 lb 8.9 oz)  Height:    5\' 4"  (1.626 m)   Constitutional: He is oriented to person, place, and time. He appears well-developed and well-nourished. No distress.  Stuttering speech which is his baseline.  HENT: Head: Normocephalic and atraumatic. Mouth/Throat: Oropharynx is clear and moist. No oropharyngeal exudate.  Eyes: Conjunctivae and EOM are normal. Pupils are equal, round, and reactive to light.  Neck: Normal range of motion. Neck supple. No JVD.  Cardiovascular: Normal rate, regular rhythm, normal heart sounds and intact distal pulses.  Chest: pronounced thoracic kyphosis.  Pulmonary/Chest: Effort normal and breath sounds normal. No respiratory distress. He exhibits tenderness. Right-sided chest pain worse with palpation.  Abdominal: Soft. There is no tenderness. There is no rebound and no guarding.  Musculoskeletal: Normal range of motion. He exhibits tenderness. He exhibits no edema.  There is tenderness to palpation of the anterior and lateral right shoulder that extends into the right chest wall.  There is no pain with range of motion of patient's shoulder he is able to raise it above his head and touch the other shoulder.  Intact radial pulses bilaterally.  Neurological: He is alert and oriented to person, place, and time. No cranial nerve deficit. He exhibits normal muscle tone. Coordination normal.  5/5  strength throughout, CN 2-12 intact, no ataxia on finger to nose, no nystagmus  Skin: Skin is warm and dry. No rash noted.  Psychiatric: He has a normal mood and affect. His behavior is normal.   Labs on Admission:  Basic Metabolic Panel: Recent Labs  Lab 10/17/17 0014  NA 140  K 4.7  CL 106  CO2 26  GLUCOSE 108*  BUN 42*  CREATININE 2.42*  CALCIUM 9.0   Liver Function Tests: Recent Labs  Lab 10/17/17 0014  AST 23  ALT 20  ALKPHOS 82  BILITOT 0.5  PROT 8.0  ALBUMIN 4.0   Recent Labs  Lab 10/17/17 0014  LIPASE  84*   No results for input(s): AMMONIA in the last 168 hours. CBC: Recent Labs  Lab 10/17/17 0014  WBC 9.6  HGB 12.9*  HCT 40.7  MCV 102.5*  PLT 194   Cardiac Enzymes: No results for input(s): CKTOTAL, CKMB, CKMBINDEX, TROPONINI in the last 168 hours.  BNP (last 3 results) No results for input(s): PROBNP in the last 8760 hours. CBG: No results for input(s): GLUCAP in the last 168 hours.  Radiological Exams on Admission: Dg Chest 2 View  Result Date: 10/17/2017 CLINICAL DATA:  Midsternal chest pain starting around 10:30 p.m. tonight and radiating to the right shoulder. Shortness of breath. EXAM: CHEST  2 VIEW COMPARISON:  10/04/2017 FINDINGS: Shallow inspiration. Cardiac enlargement without vascular congestion. No edema or consolidation. No blunting of costophrenic angles. No pneumothorax. Calcification of the aorta. Degenerative changes in the thoracic spine. Midthoracic vertebral compression deformities resulting in severe kyphosis. Fractures are demonstrated in the left posterior second and third ribs as well as the distal left clavicle. These findings were present on previous left shoulder images from 09/25/2017. IMPRESSION: Cardiac enlargement. No evidence of active pulmonary disease. Fractures of left posterior ribs and left distal clavicle as seen on previous study. Electronically Signed   By: Lucienne Capers M.D.   On: 10/17/2017 01:11   Dg  Shoulder Right  Result Date: 10/17/2017 CLINICAL DATA:  Midsternal chest pain radiating to the right shoulder. Shortness of breath. EXAM: RIGHT SHOULDER - 2+ VIEW COMPARISON:  Bilateral shoulder views 08/19/2017 FINDINGS: Left shoulder appears intact. No evidence of acute fracture or dislocation. No focal bone lesion or bone destruction. Soft tissues are unremarkable. IMPRESSION: No acute bony abnormalities demonstrated in the right shoulder. Electronically Signed   By: Lucienne Capers M.D.   On: 10/17/2017 01:12    EKG: Independently reviewed.  Sinus rhythm no acute ST-T wave abnormalities early LBB  Assessment/Plan Active Problems:   Secondary cardiomyopathy ([place])   Chronic systolic heart failure (HCC)   Long term (current) use of anticoagulants   Weakness   COPD (chronic obstructive pulmonary disease) (HCC)   Chest pain   Right shoulder pain   Elevated troponin   Atypical chest pain   CKD (chronic kidney disease) stage 4, GFR 15-29 ml/min (HCC)   1. Atypical chest pain symptoms-I suspect his pain is likely coming from his right shoulder which seems to be better now.  Given that he had a mild bump in troponin will trend and follow clinically, monitor on telemetry, likely can go home if remains stable tomorrow.  Morphine oxygen and nitro ordered. 2. Right shoulder pain-patient reports pain is much improved now.  X-ray did not show any acute abnormalities in the shoulder.  Follow clinically.  Tylenol ordered for inflammation. 3. Chronic systolic CHF-EF 26%, seems euvolemic and stated this time follow clinically, follow-up with the advanced heart failure clinic.  Resume home medications. 4. Chronic anticoagulation-resume home Eliquis. 5. Atrial fibrillation-rate is controlled and he is anticoagulated with Eliquis. 6. Stage IV CKD- creatinine remains stable at baseline from recent testing.  I asked the pharmacist to review his medications and adjust doses as necessary for renal  function.  DVT Prophylaxis: Eliquis Code Status: Full Family Communication:  Disposition Plan: Home tomorrow if stable  Time spent: 76 minutes  Irwin Brakeman, MD [facility] Pager [phone]  If 7PM-7AM, please contact night-coverage [email or web address] Password [facility] 10/17/2017, 9:21 AM

## 2017-10-17 NOTE — ED Provider Notes (Signed)
[facility] EMERGENCY DEPARTMENT Provider Note   CSN: [id number] Arrival date & time: 10/16/17  2317     History   Chief Complaint Chief Complaint  Patient presents with  . Chest Pain    HPI [name] is a 82 y.o. male.  Level 5 caveat for speech impediment.  Patient presents with right shoulder pain that onset around 10:30 PM tonight.  He told triage she was having chest pain but he states it is only his shoulder that is hurting and it radiates somewhat into his right chest.  He denies any falls or trauma.  He did have a fall a month ago and fractured his left clavicle.  He has shortness of breath at baseline from his COPD which is unchanged.  No nausea, vomiting, fever or cough.  Denies any abdominal pain.  Denies any focal weakness, numbness or tingling.   The history is provided by the patient.  Chest Pain      Past Medical History:  Diagnosis Date  . Arthritis   . Cardiomyopathy    Nonischemic, LVEF 20%  . Chronic renal insufficiency   . COPD (chronic obstructive pulmonary disease) ([place])   . Hypothyroidism   . Left bundle branch block   . Mixed hyperlipidemia   . Scoliosis     Patient Active Problem List   Diagnosis Date Noted  . Closed fracture of left Clavicle 08/19/17 09/24/2017  . CKD (chronic kidney disease) 04/25/2017  . Fever 04/25/2017  . COPD (chronic obstructive pulmonary disease) ([place])   . Hematuria, microscopic   . Weakness 04/24/2017  . Long term (current) use of anticoagulants 01/31/2011  . Rash 01/13/2011  . Atrial flutter ([place]) 12/19/2010  . Chronic systolic heart failure ([place]) 12/02/2010  . Mixed hyperlipidemia 11/08/2010  . Secondary cardiomyopathy ([place]) 11/08/2010  . LBBB 11/08/2010  . ACUTE ON CHRONIC SYSTOLIC HEART FAILURE 40/98/1191    Past Surgical History:  Procedure Laterality Date  . EYE SURGERY    . LAPAROSCOPIC CHOLECYSTECTOMY  2008  . left foot surgery  1998  . MASS EXCISION Left 06/26/2016   Procedure: EXCISION  Mucoid tumor;  Surgeon: Daryll Brod, MD;  Location: [place];  Service: Orthopedics;  Laterality: Left;  FAB  . Right inguinal herniorrhaphy    . ROTATION FLAP Left 06/26/2016   Procedure: debridement of distal interphalangeal possible, ROTATION FLAP left index finger;  Surgeon: Daryll Brod, MD;  Location: [place];  Service: Orthopedics;  Laterality: Left;  . SKIN CANCER REMOVED  LEFT EAR         Home Medications    Prior to Admission medications   Medication Sig Start Date End Date Taking? Authorizing Provider  amiodarone (PACERONE) 200 MG tablet TAKE ONE TABLET BY MOUTH ONCE DAILY 01/06/17   Bensimhon, Shaune Pascal, MD  aspirin EC 81 MG tablet Take 81 mg by mouth daily.    [provider]  betamethasone dipropionate (DIPROLENE) 0.05 % cream Apply topically 2 (two) times daily.    [provider]  carvedilol (COREG) 3.125 MG tablet TAKE 1 TABLET BY MOUTH TWICE DAILY WITH FOOD 08/28/17   Larey Dresser, MD  ELIQUIS 5 MG TABS tablet TAKE 1 TABLET BY MOUTH TWICE DAILY 08/21/17   Bensimhon, Shaune Pascal, MD  furosemide (LASIX) 40 MG tablet Take 1 tablet (40 mg total) by mouth daily. Patient taking differently: Take 40 mg by mouth every other day.  06/03/17   Bensimhon, Shaune Pascal, MD  hydrALAZINE (APRESOLINE) 25 MG tablet Take 1  tablet (25 mg total) by mouth 3 (three) times daily. 01/06/17   Bensimhon, Shaune Pascal, MD  isosorbide mononitrate (IMDUR) 30 MG 24 hr tablet TAKE 1 TABLET BY MOUTH ONCE DAILY IN THE MORNING 09/10/17   Bensimhon, Shaune Pascal, MD  levothyroxine (SYNTHROID, LEVOTHROID) 175 MCG tablet Take 175 mcg by mouth daily before breakfast.     [provider]  loratadine (CLARITIN) 10 MG tablet Take 10 mg by mouth daily.    [provider]  losartan (COZAAR) 50 MG tablet TAKE 1 TABLET BY MOUTH ONCE DAILY *NEED OFFICE VISIT FOR FUTURE REFILLS* 06/01/17   Bensimhon, Shaune Pascal, MD  spironolactone (ALDACTONE) 25 MG tablet TAKE ONE TABLET BY MOUTH ONCE DAILY 02/03/17    Bensimhon, Shaune Pascal, MD    Family History Family History  Problem Relation Age of Onset  . Heart disease Father        Died age 53  . Stroke Mother        Died age 47    Social History Social History   Tobacco Use  . Smoking status: Never Smoker  . Smokeless tobacco: Never Used  Substance Use Topics  . Alcohol use: No  . Drug use: No     Allergies   No known allergies   Review of Systems Review of Systems  Cardiovascular: Positive for chest pain.     Physical Exam Updated Vital Signs BP (!) 143/68   Pulse 66   Temp 97.8 F (36.6 C) (Oral)   Resp 18   Ht 5\' 4"  (1.626 m)   Wt 68.9 kg (152 lb)   SpO2 98%   BMI 26.09 kg/m   Physical Exam  Constitutional: He is oriented to person, place, and time. He appears well-developed and well-nourished. No distress.  Stuttering speech, baseline per family  HENT:  Head: Normocephalic and atraumatic.  Mouth/Throat: Oropharynx is clear and moist. No oropharyngeal exudate.  Eyes: Conjunctivae and EOM are normal. Pupils are equal, round, and reactive to light.  Neck: Normal range of motion. Neck supple.  No meningismus  Cardiovascular: Normal rate, regular rhythm, normal heart sounds and intact distal pulses.  No murmur heard. Thoracic kyphosis  Pulmonary/Chest: Effort normal and breath sounds normal. No respiratory distress. He exhibits tenderness.  Right-sided chest pain worse with palpation  Abdominal: Soft. There is no tenderness. There is no rebound and no guarding.  Musculoskeletal: Normal range of motion. He exhibits tenderness. He exhibits no edema.  There is tenderness to palpation of the anterior and lateral right shoulder that extends into the right chest wall.  There is no pain with range of motion of patient's shoulder he is able to raise it above his head and touch the other shoulder.  Intact radial pulses bilaterally.  Neurological: He is alert and oriented to person, place, and time. No cranial nerve  deficit. He exhibits normal muscle tone. Coordination normal.  5/5 strength throughout, CN 2-12 intact, no ataxia on finger to nose, no nystagmus  Skin: Skin is warm and dry. No rash noted.  Psychiatric: He has a normal mood and affect. His behavior is normal.  Nursing note and vitals reviewed.    ED Treatments / Results  Labs (all labs ordered are listed, but only abnormal results are displayed) Labs Reviewed  BASIC METABOLIC PANEL - Abnormal; Notable for the following components:      Result Value   Glucose, Bld 108 (*)    BUN 42 (*)    Creatinine, Ser 2.42 (*)  GFR calc non Af Amer 24 (*)    GFR calc Af Amer 28 (*)    All other components within normal limits  CBC - Abnormal; Notable for the following components:   RBC 3.97 (*)    Hemoglobin 12.9 (*)    MCV 102.5 (*)    All other components within normal limits  BRAIN NATRIURETIC PEPTIDE - Abnormal; Notable for the following components:   B Natriuretic Peptide 117.0 (*)    All other components within normal limits  LIPASE, BLOOD - Abnormal; Notable for the following components:   Lipase 84 (*)    All other components within normal limits  I-STAT TROPONIN, ED - Abnormal; Notable for the following components:   Troponin i, poc 0.09 (*)    All other components within normal limits  D-DIMER, QUANTITATIVE (NOT AT [facility])  HEPATIC FUNCTION PANEL  I-STAT TROPONIN, ED  I-STAT TROPONIN, ED    EKG  EKG Interpretation  Date/Time:  Friday October 16 2017 23:38:56 EST Ventricular Rate:  62 PR Interval:    QRS Duration: 161 QT Interval:  495 QTC Calculation: 503 R Axis:   67 Text Interpretation:  Sinus rhythm Short PR interval Probable left atrial enlargement IVCD, consider atypical LBBB No significant change was found Confirmed by Ezequiel Essex [phone]) on 10/17/2017 12:00:27 AM       Radiology Dg Chest 2 View  Result Date: 10/17/2017 CLINICAL DATA:  Midsternal chest pain starting around 10:30 p.m. tonight and  radiating to the right shoulder. Shortness of breath. EXAM: CHEST  2 VIEW COMPARISON:  10/04/2017 FINDINGS: Shallow inspiration. Cardiac enlargement without vascular congestion. No edema or consolidation. No blunting of costophrenic angles. No pneumothorax. Calcification of the aorta. Degenerative changes in the thoracic spine. Midthoracic vertebral compression deformities resulting in severe kyphosis. Fractures are demonstrated in the left posterior second and third ribs as well as the distal left clavicle. These findings were present on previous left shoulder images from 09/25/2017. IMPRESSION: Cardiac enlargement. No evidence of active pulmonary disease. Fractures of left posterior ribs and left distal clavicle as seen on previous study. Electronically Signed   By: Lucienne Capers M.D.   On: 10/17/2017 01:11   Dg Shoulder Right  Result Date: 10/17/2017 CLINICAL DATA:  Midsternal chest pain radiating to the right shoulder. Shortness of breath. EXAM: RIGHT SHOULDER - 2+ VIEW COMPARISON:  Bilateral shoulder views 08/19/2017 FINDINGS: Left shoulder appears intact. No evidence of acute fracture or dislocation. No focal bone lesion or bone destruction. Soft tissues are unremarkable. IMPRESSION: No acute bony abnormalities demonstrated in the right shoulder. Electronically Signed   By: Lucienne Capers M.D.   On: 10/17/2017 01:12    Procedures Procedures (including critical care time)  Medications Ordered in ED Medications - No data to display   Initial Impression / Assessment and Plan / ED Course  I have reviewed the triage vital signs and the nursing notes.  Pertinent labs & imaging results that were available during my care of the patient were reviewed by me and considered in my medical decision making (see chart for details).    Patient with right-sided shoulder pain that radiates into his chest.  Denies any new trauma.  States pain is similar to his arthritis.  EKG is left bundle branch block  and unchanged.  Pain is worse with palpation and movement of the right shoulder.  Patient denies chest pain to me. Initial troponin is negative.  Chest x-ray shoulder x-ray are reassuring Cardiology records reviewed.  Patient with  history of chronic systolic CHF with EF of 09%.  Has declined AICD in the past.  Catheterization in 2012 did not show any significant CAD.  Shoulder pain has resolved with Tylenol.  Second troponin is minimally elevated at 0.07.  This may be normal due to patient's CKD.  Continues to be pain free. Troponin has risen to 0.11.  Difficult to interpret in setting of CKD. With age and risk factors, heart score 5 with rising troponin,observation admission d/w hospitlist.  Final Clinical Impressions(s) / ED Diagnoses   Final diagnoses:  Acute pain of right shoulder  Atypical chest pain  Elevated troponin    ED Discharge Orders    None       Khristian Seals, Annie Main, MD 10/17/17 [phone]

## 2017-10-17 NOTE — Progress Notes (Signed)
CRITICAL VALUE ALERT  Critical Value:  Troponin 0.08  Date & Time Notied:  1403  Provider Notified: Text Paged Dr. Wynetta Emery to notify.   Orders Received/Actions taken: Text paged MD to notify, decreased since previous level drawn.

## 2017-10-17 NOTE — ED Provider Notes (Signed)
D/w Dr Wynetta Emery.  Will admit pt.   Dorie Rank, MD 10/17/17 [phone]

## 2017-10-18 DIAGNOSIS — J449 Chronic obstructive pulmonary disease, unspecified: Secondary | ICD-10-CM | POA: Diagnosis not present

## 2017-10-18 DIAGNOSIS — R531 Weakness: Secondary | ICD-10-CM | POA: Diagnosis not present

## 2017-10-18 DIAGNOSIS — I5022 Chronic systolic (congestive) heart failure: Secondary | ICD-10-CM | POA: Diagnosis not present

## 2017-10-18 DIAGNOSIS — M25511 Pain in right shoulder: Secondary | ICD-10-CM

## 2017-10-18 DIAGNOSIS — N184 Chronic kidney disease, stage 4 (severe): Secondary | ICD-10-CM | POA: Diagnosis not present

## 2017-10-18 DIAGNOSIS — I429 Cardiomyopathy, unspecified: Secondary | ICD-10-CM | POA: Diagnosis not present

## 2017-10-18 DIAGNOSIS — R748 Abnormal levels of other serum enzymes: Secondary | ICD-10-CM | POA: Diagnosis not present

## 2017-10-18 DIAGNOSIS — Z7901 Long term (current) use of anticoagulants: Secondary | ICD-10-CM | POA: Diagnosis not present

## 2017-10-18 DIAGNOSIS — R0789 Other chest pain: Secondary | ICD-10-CM | POA: Diagnosis not present

## 2017-10-18 LAB — LIPID PANEL
CHOL/HDL RATIO: 3.4 ratio
Cholesterol: 164 mg/dL (ref 0–200)
HDL: 48 mg/dL (ref >40)
LDL CALC: 90 mg/dL (ref 0–99)
Triglycerides: 129 mg/dL (ref <150)
VLDL: 26 mg/dL (ref 0–40)

## 2017-10-18 LAB — TSH: TSH: 0.521 u[IU]/mL (ref 0.350–4.500)

## 2017-10-18 LAB — HEMOGLOBIN A1C
HEMOGLOBIN A1C: 5.5 % (ref 4.8–5.6)
MEAN PLASMA GLUCOSE: 111.15 mg/dL

## 2017-10-18 MED ORDER — FUROSEMIDE 40 MG PO TABS: 40.0000 mg | ORAL_TABLET | ORAL | Status: DC

## 2017-10-18 NOTE — Progress Notes (Signed)
Pt discharged in stable condition via wheelchair into the care of his family via private vehicle.  PIV removed intact w/o S&S of complications.  Discharge instructions reviewed with pt.  Pt verbalized understanding.

## 2017-10-18 NOTE — Discharge Instructions (Signed)
Follow with Primary MD  Asencion Noble, MD  and other consultant's as instructed your Hospitalist MD  Please get a complete blood count and chemistry panel checked by your Primary MD at your next visit, and again as instructed by your Primary MD.  Get Medicines reviewed and adjusted: Please take all your medications with you for your next visit with your Primary MD  Laboratory/radiological data: Please request your Primary MD to go over all hospital tests and procedure/radiological results at the follow up, please ask your Primary MD to get all Hospital records sent to his/her office.  In some cases, they will be blood work, cultures and biopsy results pending at the time of your discharge. Please request that your primary care M.D. follows up on these results.  Also Note the following: If you experience worsening of your admission symptoms, develop shortness of breath, life threatening emergency, suicidal or homicidal thoughts you must seek medical attention immediately by calling 911 or calling your MD immediately  if symptoms less severe.  You must read complete instructions/literature along with all the possible adverse reactions/side effects for all the Medicines you take and that have been prescribed to you. Take any new Medicines after you have completely understood and accpet all the possible adverse reactions/side effects.   Do not drive when taking Pain medications or sleeping medications (Benzodaizepines)  Do not take more than prescribed Pain, Sleep and Anxiety Medications. It is not advisable to combine anxiety,sleep and pain medications without talking with your primary care practitioner  Special Instructions: If you have smoked or chewed Tobacco  in the last 2 yrs please stop smoking, stop any regular Alcohol  and or any Recreational drug use.  Wear Seat belts while driving.  Please note: You were cared for by a hospitalist during your hospital stay. Once you are discharged, your  primary care physician will handle any further medical issues. Please note that NO REFILLS for any discharge medications will be authorized once you are discharged, as it is imperative that you return to your primary care physician (or establish a relationship with a primary care physician if you do not have one) for your post hospital discharge needs so that they can reassess your need for medications and monitor your lab values.

## 2017-10-18 NOTE — Discharge Summary (Signed)
Physician Discharge Summary  TAM DELISLE CNO:[id number] DOB: October 16, 1935 DOA: 10/16/2017  PCP: Asencion Noble, MD  Admit date: 10/16/2017 Discharge date: 10/18/2017  Admitted From: Home  Disposition:   Recommendations for Outpatient Follow-up:  1. Follow up with PCP in 1 weeks 2. Please obtain BMP/CBC in one week 3. Follow up with advanced heart failure clinic in 2 weeks 4. Follow up with orthopedics about shoulder pain 1 week  Discharge Condition: STABLE   CODE STATUS: FULL    Brief Hospitalization Summary: Please see all hospital notes, images, labs for full details of the hospitalization. HPI: BOLESLAW BORGHI is a 82 y.o. male with stage IV CKD, chronic systolic NYHA II HF ECHO 2947 EF%. 35% Offered ICD in the past but he declined presented to the ED with complaints of sudden onset of right shoulder pain that occurred around 10:30 PM last night.  He said that the pain in the right shoulder radiated into the right side of the chest as well.  He denies any falls or trauma.  He does have a history of a left clavicle fracture from 1 month ago after a fall but has since been cleared by orthopedics.  He has chronic shortness of breath from COPD but that has not changed.  He denies nausea vomiting, fever and chills.  He denies abdominal pain.  He denies weakness, focal weakness numbness and tingling.  He was seen in the ED and monitored.  There are no acute EKG changes noted.  He had some serial troponins.  There was a small trend of his troponin rising.  His troponin went from 0.03-0.11.  He is being admitted for further observation and to trend troponins to rule out an acute ischemic event.  According to records from his cardiologist his coronary angiograms showed insignificant CAD.  He is followed in the advanced heart failure clinic.   1. Atypical chest pain symptoms-I suspect his pain is likely coming from his right shoulder which seems to be better now.  Given that he had a mild bump in  troponin he was observed, monitored on telemetry, troponin came down and ACS was ruled out:  Troponins: 0.10, 0.08, 0.07.   2. Right shoulder pain-patient reports pain is much improved now.  X-ray did not show any acute abnormalities in the shoulder.  Follow clinically.  Tylenol ordered for inflammation. Follow up with orthopedics in 1 week.  3. Chronic systolic CHF-EF 65%, seems euvolemic and stated this time follow clinically, follow-up with the advanced heart failure clinic.  Resume home medications. 4. Chronic anticoagulation-resume home Eliquis. 5. Atrial fibrillation-rate is controlled and he is anticoagulated with Eliquis. 6. Stage IV CKD- creatinine remains stable at baseline from recent testing.  I asked the pharmacist to review his medications and adjust doses as necessary for renal function.  DVT Prophylaxis: Eliquis Code Status: Full Family Communication:  Disposition Plan: Home   Discharge Diagnoses:  Active Problems:   Secondary cardiomyopathy ([place])   Chronic systolic heart failure (HCC)   Long term (current) use of anticoagulants   Weakness   COPD (chronic obstructive pulmonary disease) (HCC)   Chest pain   Right shoulder pain   Elevated troponin   Atypical chest pain   CKD (chronic kidney disease) stage 4, GFR 15-29 ml/min [facility])  Discharge Instructions: Discharge Instructions    Increase activity slowly   Complete by:  As directed      Allergies as of 10/18/2017      Reactions   No Known Allergies  Medication List    TAKE these medications   aspirin EC 81 MG tablet Take 81 mg by mouth daily.   betamethasone dipropionate 0.05 % cream Commonly known as:  DIPROLENE Apply topically 2 (two) times daily.   carvedilol 3.125 MG tablet Commonly known as:  COREG TAKE 1 TABLET BY MOUTH TWICE DAILY WITH FOOD   ELIQUIS 5 MG Tabs tablet Generic drug:  apixaban TAKE 1 TABLET BY MOUTH TWICE DAILY   furosemide 40 MG tablet Commonly known as:  LASIX Take 1  tablet (40 mg total) by mouth every other day.   hydrALAZINE 25 MG tablet Commonly known as:  APRESOLINE Take 1 tablet (25 mg total) by mouth 3 (three) times daily.   isosorbide mononitrate 30 MG 24 hr tablet Commonly known as:  IMDUR TAKE 1 TABLET BY MOUTH ONCE DAILY IN THE MORNING   levothyroxine 175 MCG tablet Commonly known as:  SYNTHROID, LEVOTHROID Take 175 mcg by mouth daily before breakfast.   loratadine 10 MG tablet Commonly known as:  CLARITIN Take 10 mg by mouth daily.   losartan 50 MG tablet Commonly known as:  COZAAR TAKE 1 TABLET BY MOUTH ONCE DAILY *NEED OFFICE VISIT FOR FUTURE REFILLS*   spironolactone 25 MG tablet Commonly known as:  ALDACTONE TAKE ONE TABLET BY MOUTH ONCE DAILY      Follow-up Information    Asencion Noble, MD. Schedule an appointment as soon as possible for a visit in 2 week(s).   Specialty:  Internal Medicine Why:  Hospital follow-up Contact information: [address] [place] [address] [phone]        Carole Civil, MD. Schedule an appointment as soon as possible for a visit in 1 week(s).   Specialties:  Orthopedic Surgery, Radiology Why:  Hospital follow-up right shoulder pain Contact information: [address] [place] [address] [phone]        Bensimhon, Shaune Pascal, MD. Schedule an appointment as soon as possible for a visit in 2 week(s).   Specialty:  Cardiology Why:  Hospital Follow Up  Contact information: [address] [place] [address] [phone]          Allergies  Allergen Reactions  . No Known Allergies    Allergies as of 10/18/2017      Reactions   No Known Allergies       Medication List    TAKE these medications   aspirin EC 81 MG tablet Take 81 mg by mouth daily.   betamethasone dipropionate 0.05 % cream Commonly known as:  DIPROLENE Apply topically 2 (two) times daily.   carvedilol 3.125 MG tablet Commonly known as:  COREG TAKE 1 TABLET  BY MOUTH TWICE DAILY WITH FOOD   ELIQUIS 5 MG Tabs tablet Generic drug:  apixaban TAKE 1 TABLET BY MOUTH TWICE DAILY   furosemide 40 MG tablet Commonly known as:  LASIX Take 1 tablet (40 mg total) by mouth every other day.   hydrALAZINE 25 MG tablet Commonly known as:  APRESOLINE Take 1 tablet (25 mg total) by mouth 3 (three) times daily.   isosorbide mononitrate 30 MG 24 hr tablet Commonly known as:  IMDUR TAKE 1 TABLET BY MOUTH ONCE DAILY IN THE MORNING   levothyroxine 175 MCG tablet Commonly known as:  SYNTHROID, LEVOTHROID Take 175 mcg by mouth daily before breakfast.   loratadine 10 MG tablet Commonly known as:  CLARITIN Take 10 mg by mouth daily.   losartan 50 MG tablet Commonly  known as:  COZAAR TAKE 1 TABLET BY MOUTH ONCE DAILY *NEED OFFICE VISIT FOR FUTURE REFILLS*   spironolactone 25 MG tablet Commonly known as:  ALDACTONE TAKE ONE TABLET BY MOUTH ONCE DAILY       Procedures/Studies: Dg Chest 2 View  Result Date: 10/17/2017 CLINICAL DATA:  Midsternal chest pain starting around 10:30 p.m. tonight and radiating to the right shoulder. Shortness of breath. EXAM: CHEST  2 VIEW COMPARISON:  10/04/2017 FINDINGS: Shallow inspiration. Cardiac enlargement without vascular congestion. No edema or consolidation. No blunting of costophrenic angles. No pneumothorax. Calcification of the aorta. Degenerative changes in the thoracic spine. Midthoracic vertebral compression deformities resulting in severe kyphosis. Fractures are demonstrated in the left posterior second and third ribs as well as the distal left clavicle. These findings were present on previous left shoulder images from 09/25/2017. IMPRESSION: Cardiac enlargement. No evidence of active pulmonary disease. Fractures of left posterior ribs and left distal clavicle as seen on previous study. Electronically Signed   By: Lucienne Capers M.D.   On: 10/17/2017 01:11   Dg Clavicle Left  Result Date: 09/25/2017 Radiology  report dictating by Dr. Aline Brochure Patient has a left distal clavicle fracture probably a type II variant X-rays show prominent left distal clavicle joint is intact no obvious callus no further displacement Impression distal clavicle fracture no change in position  Dg Shoulder Right  Result Date: 10/17/2017 CLINICAL DATA:  Midsternal chest pain radiating to the right shoulder. Shortness of breath. EXAM: RIGHT SHOULDER - 2+ VIEW COMPARISON:  Bilateral shoulder views 08/19/2017 FINDINGS: Left shoulder appears intact. No evidence of acute fracture or dislocation. No focal bone lesion or bone destruction. Soft tissues are unremarkable. IMPRESSION: No acute bony abnormalities demonstrated in the right shoulder. Electronically Signed   By: Lucienne Capers M.D.   On: 10/17/2017 01:12     Subjective: Pt without complaints this morning.  No CP, no SOB.   Discharge Exam: Vitals:   10/18/17 0430 10/18/17 0734  BP: (!) 107/35 (!) 103/49  Pulse: (!) 47 (!) 44  Resp: 20   Temp: 98.5 F (36.9 C)   SpO2: 95%    Vitals:   10/17/17 2030 10/18/17 0030 10/18/17 0430 10/18/17 0734  BP: (!) 114/41 (!) 141/50 (!) 107/35 (!) 103/49  Pulse: (!) 50 62 (!) 47 (!) 44  Resp: 17 20 20    Temp: 98.3 F (36.8 C) 98 F (36.7 C) 98.5 F (36.9 C)   TempSrc: Oral  Oral   SpO2: 97% 97% 95%   Weight:      Height:        General: Pt is alert, awake, not in acute distress Cardiovascular: RRR, S1/S2 +, no rubs, no gallops Respiratory: CTA bilaterally, no wheezing, no rhonchi Abdominal: Soft, NT, ND, bowel sounds + Extremities: no edema, no cyanosis   The results of significant diagnostics from this hospitalization (including imaging, microbiology, ancillary and laboratory) are listed below for reference.     Microbiology: No results found for this or any previous visit (from the past 240 hour(s)).   Labs: BNP (last 3 results) Recent Labs    10/17/17 0014  BNP 811.9*   Basic Metabolic Panel: Recent Labs   Lab 10/17/17 0014  NA 140  K 4.7  CL 106  CO2 26  GLUCOSE 108*  BUN 42*  CREATININE 2.42*  CALCIUM 9.0   Liver Function Tests: Recent Labs  Lab 10/17/17 0014  AST 23  ALT 20  ALKPHOS 82  BILITOT 0.5  PROT  8.0  ALBUMIN 4.0   Recent Labs  Lab 10/17/17 0014  LIPASE 84*   No results for input(s): AMMONIA in the last 168 hours. CBC: Recent Labs  Lab 10/17/17 0014  WBC 9.6  HGB 12.9*  HCT 40.7  MCV 102.5*  PLT 194   Cardiac Enzymes: Recent Labs  Lab 10/17/17 1001 10/17/17 1251 10/17/17 1547  TROPONINI 0.10* 0.08* 0.07*   BNP: Invalid input(s): POCBNP CBG: No results for input(s): GLUCAP in the last 168 hours. D-Dimer Recent Labs    10/17/17 0014  DDIMER 0.38   Hgb A1c No results for input(s): HGBA1C in the last 72 hours. Lipid Profile Recent Labs    10/18/17 0818  CHOL 164  HDL 48  LDLCALC 90  TRIG 129  CHOLHDL 3.4   Thyroid function studies Recent Labs    10/18/17 0818  TSH 0.521   Anemia work up No results for input(s): VITAMINB12, FOLATE, FERRITIN, TIBC, IRON, RETICCTPCT in the last 72 hours. Urinalysis    Component Value Date/Time   COLORURINE YELLOW 04/24/2017 1943   APPEARANCEUR CLEAR 04/24/2017 1943   LABSPEC 1.016 04/24/2017 1943   PHURINE 6.0 04/24/2017 1943   GLUCOSEU NEGATIVE 04/24/2017 1943   HGBUR SMALL (A) 04/24/2017 1943   BILIRUBINUR NEGATIVE 04/24/2017 [place] 04/24/2017 1943   PROTEINUR NEGATIVE 04/24/2017 1943   UROBILINOGEN 0.2 01/24/2012 1315   NITRITE NEGATIVE 04/24/2017 1943   LEUKOCYTESUR NEGATIVE 04/24/2017 1943   Sepsis Labs Invalid input(s): PROCALCITONIN,  WBC,  LACTICIDVEN Microbiology No results found for this or any previous visit (from the past 240 hour(s)).  Time coordinating discharge:   SIGNED:  Irwin Brakeman, MD  [facility] 10/18/2017, 10:25 AM Pager [phone]  If 7PM-7AM, please contact night-coverage [email or web address] Password [email or web address]

## 2017-10-19 LAB — VITAMIN D 25 HYDROXY (VIT D DEFICIENCY, FRACTURES): VIT D 25 HYDROXY: 21 ng/mL — AB (ref 30.0–100.0)

## 2017-10-26 DIAGNOSIS — I5022 Chronic systolic (congestive) heart failure: Secondary | ICD-10-CM | POA: Diagnosis not present

## 2017-10-26 DIAGNOSIS — E039 Hypothyroidism, unspecified: Secondary | ICD-10-CM | POA: Diagnosis not present

## 2017-10-26 DIAGNOSIS — N184 Chronic kidney disease, stage 4 (severe): Secondary | ICD-10-CM | POA: Diagnosis not present

## 2017-11-29 ENCOUNTER — Other Ambulatory Visit (HOSPITAL_COMMUNITY): Payer: Self-pay | Admitting: Internal Medicine

## 2017-12-20 ENCOUNTER — Other Ambulatory Visit (HOSPITAL_COMMUNITY): Payer: Self-pay | Admitting: Internal Medicine

## 2018-01-03 ENCOUNTER — Other Ambulatory Visit (HOSPITAL_COMMUNITY): Payer: Self-pay | Admitting: Cardiology

## 2018-01-03 ENCOUNTER — Other Ambulatory Visit (HOSPITAL_COMMUNITY): Payer: Self-pay | Admitting: Internal Medicine

## 2018-01-04 ENCOUNTER — Other Ambulatory Visit (HOSPITAL_COMMUNITY): Payer: Self-pay | Admitting: *Deleted

## 2018-01-04 DIAGNOSIS — Z79899 Other long term (current) drug therapy: Secondary | ICD-10-CM | POA: Diagnosis not present

## 2018-01-04 DIAGNOSIS — I5022 Chronic systolic (congestive) heart failure: Secondary | ICD-10-CM | POA: Diagnosis not present

## 2018-01-04 DIAGNOSIS — N184 Chronic kidney disease, stage 4 (severe): Secondary | ICD-10-CM | POA: Diagnosis not present

## 2018-01-04 DIAGNOSIS — E039 Hypothyroidism, unspecified: Secondary | ICD-10-CM | POA: Diagnosis not present

## 2018-01-04 LAB — BASIC METABOLIC PANEL
BUN: 42 — AB (ref 4–21)
CREATININE: 2.6 — AB (ref 0.6–1.3)
GLUCOSE: 87
Potassium: 5.1 (ref 3.4–5.3)
Sodium: 142 (ref 137–147)

## 2018-01-04 MED ORDER — LOSARTAN POTASSIUM 50 MG PO TABS: ORAL_TABLET | ORAL | 0 refills | Status: DC

## 2018-01-11 DIAGNOSIS — I5022 Chronic systolic (congestive) heart failure: Secondary | ICD-10-CM | POA: Diagnosis not present

## 2018-01-11 DIAGNOSIS — N184 Chronic kidney disease, stage 4 (severe): Secondary | ICD-10-CM | POA: Diagnosis not present

## 2018-01-11 DIAGNOSIS — E039 Hypothyroidism, unspecified: Secondary | ICD-10-CM | POA: Diagnosis not present

## 2018-01-20 ENCOUNTER — Encounter: Payer: Self-pay | Admitting: Internal Medicine

## 2018-01-20 NOTE — Progress Notes (Signed)
MMSE score 21/30 01/14/18 and failed clock.

## 2018-01-22 ENCOUNTER — Encounter (HOSPITAL_COMMUNITY): Payer: Self-pay | Admitting: Internal Medicine

## 2018-01-22 ENCOUNTER — Ambulatory Visit (HOSPITAL_COMMUNITY)
Admission: RE | Admit: 2018-01-22 | Discharge: 2018-01-22 | Disposition: A | Discharge status: Home / Self Care | Payer: Medicare HMO | Source: Ambulatory Visit | Attending: Internal Medicine | Admitting: Internal Medicine

## 2018-01-22 VITALS — BP 115/48 | HR 50 | Wt 153.0 lb

## 2018-01-22 DIAGNOSIS — Z7901 Long term (current) use of anticoagulants: Secondary | ICD-10-CM | POA: Diagnosis not present

## 2018-01-22 DIAGNOSIS — E039 Hypothyroidism, unspecified: Secondary | ICD-10-CM | POA: Insufficient documentation

## 2018-01-22 DIAGNOSIS — N183 Chronic kidney disease, stage 3 (moderate): Secondary | ICD-10-CM | POA: Insufficient documentation

## 2018-01-22 DIAGNOSIS — I428 Other cardiomyopathies: Secondary | ICD-10-CM | POA: Insufficient documentation

## 2018-01-22 DIAGNOSIS — I4892 Unspecified atrial flutter: Secondary | ICD-10-CM | POA: Insufficient documentation

## 2018-01-22 DIAGNOSIS — Z7982 Long term (current) use of aspirin: Secondary | ICD-10-CM | POA: Diagnosis not present

## 2018-01-22 DIAGNOSIS — I5022 Chronic systolic (congestive) heart failure: Secondary | ICD-10-CM | POA: Diagnosis not present

## 2018-01-22 DIAGNOSIS — I447 Left bundle-branch block, unspecified: Secondary | ICD-10-CM | POA: Diagnosis not present

## 2018-01-22 DIAGNOSIS — J449 Chronic obstructive pulmonary disease, unspecified: Secondary | ICD-10-CM | POA: Insufficient documentation

## 2018-01-22 DIAGNOSIS — I480 Paroxysmal atrial fibrillation: Secondary | ICD-10-CM

## 2018-01-22 DIAGNOSIS — Z79899 Other long term (current) drug therapy: Secondary | ICD-10-CM | POA: Diagnosis not present

## 2018-01-22 DIAGNOSIS — E782 Mixed hyperlipidemia: Secondary | ICD-10-CM | POA: Insufficient documentation

## 2018-01-22 DIAGNOSIS — Z79890 Hormone replacement therapy (postmenopausal): Secondary | ICD-10-CM | POA: Insufficient documentation

## 2018-01-22 NOTE — Patient Instructions (Signed)
The following orders were sent to [facility]:   Continue current meds  Continue to wear compression hose  Follow up with echocardiogram in 6 months

## 2018-01-22 NOTE — Progress Notes (Signed)
ADVANCED HEART FAILURE CLINIC  Patient ID: [name], male   DOB: 01-May-1936, 82 y.o.   MRN: [id number]   ADVANCED HEART FAILURE NOTE PCP: Dr Willey Blade  HPI: [name] is a 82 y/o male with h/o cognitive impairment, renal insufficiency (baseline about 1.5-1.6), severe CHF due to NICM (EF 15-20%), atrial flutter (s/p DC-CV in April 2012) and LBBB. He and his family are not interested in ICD.   12/2010: Coronary angiograms showed insignifcant CAD. Right heart cath (on milrinone) showed a right atrial pressure of about 12, PA pressure was 45/79 with a mean of 27,pulmonary capillary wedge pressure was 12, LV pressure was 85/5 with an EDP of about 8.  Fick cardiac output was 4.4 with a cardiac index of 2.3.  Pulmonary artery saturations were 61 and 63%.  Pulmonary vascular resistance was 3.1 [place] units.   12/2010: TEE-guided cardioversion that showed an EF of about 15-20%.  There was no evidence of left atrial appendage thrombus.  He was successfully cardioverted with 1 shock.  He was started on amiodarone to help maintain sinus rhythm.Given his LBBB we discussed the possibility of BiV-ICD but decided to defer as he was doing so well.  He returns today with his brother for HF follow up. Has been at [facility] for about 1.5 weeks. Doing well there. No SOB, edema, orthopnea or PND. No CP or palpitations.  Remains on lasix 40mg  every other daily. Had bloodwork with Dr. Willey Blade 2 weeks ago    02/2011: ECHO with EF 02-54%, Grade 1 diastolic dysfunction, ventricular septum dyssynergy.  Mildly reduced RV systolic function.   06/28/12 ECHO EF 25-30% 11/2015: Echo EF 35%.   Labs (9/16): K 4.5 Creatinine 1.8 TC 144 TG 104 HDL 45 LDL 78 Labs (10/17) K 4.8, Creatinine 2.31.   ROS: All systems negative except as listed in HPI, PMH and Problem List.  Past Medical History:  Diagnosis Date  . Arthritis   . Cardiomyopathy    Nonischemic, LVEF 20%  . Chronic renal insufficiency   . COPD  (chronic obstructive pulmonary disease) ([place])   . Hypothyroidism   . Left bundle branch block   . Mixed hyperlipidemia   . Scoliosis     Current Outpatient Medications  Medication Sig Dispense Refill  . amiodarone (PACERONE) 200 MG tablet TAKE 1 TABLET BY MOUTH ONCE DAILY 30 tablet 11  . aspirin EC 81 MG tablet Take 81 mg by mouth daily.    . betamethasone dipropionate (DIPROLENE) 0.05 % cream Apply topically 2 (two) times daily.    . carvedilol (COREG) 3.125 MG tablet TAKE 1 TABLET BY MOUTH TWICE DAILY WITH FOOD 60 tablet 6  . ELIQUIS 5 MG TABS tablet TAKE 1 TABLET BY MOUTH TWICE DAILY 60 tablet 3  . furosemide (LASIX) 40 MG tablet Take 1 tablet (40 mg total) by mouth every other day.    . hydrALAZINE (APRESOLINE) 25 MG tablet TAKE 1 TABLET BY MOUTH THREE TIMES DAILY 90 tablet 11  . isosorbide mononitrate (IMDUR) 30 MG 24 hr tablet TAKE 1 TABLET BY MOUTH ONCE DAILY IN THE MORNING 30 tablet 6  . levothyroxine (SYNTHROID, LEVOTHROID) 175 MCG tablet Take 175 mcg by mouth daily before breakfast.     . loratadine (CLARITIN) 10 MG tablet Take 10 mg by mouth daily.    [name] losartan (COZAAR) 50 MG tablet TAKE 1 TABLET BY MOUTH ONCE DAILY 90 tablet 0  . spironolactone (ALDACTONE) 25 MG tablet TAKE ONE TABLET BY MOUTH ONCE DAILY 90  tablet 3   No current facility-administered medications for this encounter.      PHYSICAL EXAM: Vitals:   01/22/18 1448  BP: (!) 115/48  Pulse: (!) 50  SpO2: 97%  Weight: 153 lb (69.4 kg)    General: Well appearing male, severe kypho-scoliosis, brother present at visit.  HEENT: normal Neck: supple. no obvious JVD. Carotids 2+ bilat; no bruits. No lymphadenopathy or thryomegaly appreciated. Cor: PMI nondisplaced. Brady regular No rubs, gallops or murmurs. Lungs: clear Abdomen: soft, nontender, nondistended. No hepatosplenomegaly. No bruits or masses. Good bowel sounds. Extremities: no cyanosis, clubbing, rash, 2+ edema on LLE with venous stasis changes. Trace  edema on R Neuro: alert & orientedx3, cranial nerves grossly intact. moves all 4 extremities w/o difficulty. Affect pleasant   ASSESSMENT & PLAN:  1. Chronic systolic HF ECHO 3244  EF%. 25-30% Offered ICD in the past but he declined.  Echo 3/17 EF 35% - NYHA II.  - Overall volume status looks good. Does have some edema on LLE but has failed lasix titration in past due to AKI. Continue current regimen. Continue to use compression stockings - Continue carvedilol 3.125 mg twice a day - Continue hydralazine 25 mg tid/Imdur 30 mg daily  - Continue losartan 50 mg daily.(has not wanted to switch to [place] due to cost) - [employer] social [employer].  - Continue compression hose - Repeat echo at next visit  2. AFL  - maintaining SR on amio 200 bid. On apixaban. No bleeding. If HR decreases further will need to stop carvedilol  3. CKD, stage 3  - check labs today 4. LBBB - stable   Glori Bickers, MD 3:01 PM

## 2018-01-29 ENCOUNTER — Telehealth (HOSPITAL_COMMUNITY): Payer: Self-pay

## 2018-01-29 NOTE — Telephone Encounter (Signed)
[facility] of [facility] called b/c she was confused of the dosage of Lasix pt was taking. She had in her records 40 mg (1 tab) daily and we have 40 mg (1 tab) every other day. It was changed during an ED visit in Feb,2019. She is faxing over her MAR to for Dr. Haroldine Laws  To decide what dose the Pt should be on.

## 2018-02-01 NOTE — Telephone Encounter (Signed)
[name] made aware on 01/29/2018 and agreeable to plan will fax BMET results to office and no further questions

## 2018-02-08 DIAGNOSIS — Z79899 Other long term (current) drug therapy: Secondary | ICD-10-CM | POA: Diagnosis not present

## 2018-02-08 DIAGNOSIS — D649 Anemia, unspecified: Secondary | ICD-10-CM | POA: Diagnosis not present

## 2018-02-08 LAB — BASIC METABOLIC PANEL
BUN: 44 — AB (ref 4–21)
BUN: 44 — AB (ref 4–21)
Creatinine: 2.4 — AB (ref 0.6–1.3)
Creatinine: 2.4 — AB (ref 0.6–1.3)
GLUCOSE: 92
Glucose: 92
Potassium: 4.7 (ref 3.4–5.3)
Potassium: 4.7 (ref 3.4–5.3)
Sodium: 140 (ref 137–147)
Sodium: 140 (ref 137–147)

## 2018-02-24 ENCOUNTER — Encounter: Payer: Self-pay | Admitting: Internal Medicine

## 2018-02-24 ENCOUNTER — Other Ambulatory Visit (HOSPITAL_COMMUNITY): Payer: Self-pay | Admitting: Internal Medicine

## 2018-02-24 ENCOUNTER — Non-Acute Institutional Stay: Payer: Medicare HMO | Admitting: Internal Medicine

## 2018-02-24 VITALS — BP 120/60 | HR 54 | Temp 98.1°F | Ht 64.0 in | Wt 156.0 lb

## 2018-02-24 DIAGNOSIS — N184 Chronic kidney disease, stage 4 (severe): Secondary | ICD-10-CM

## 2018-02-24 DIAGNOSIS — R21 Rash and other nonspecific skin eruption: Secondary | ICD-10-CM

## 2018-02-24 DIAGNOSIS — I429 Cardiomyopathy, unspecified: Secondary | ICD-10-CM

## 2018-02-24 DIAGNOSIS — E782 Mixed hyperlipidemia: Secondary | ICD-10-CM

## 2018-02-24 DIAGNOSIS — I4892 Unspecified atrial flutter: Secondary | ICD-10-CM

## 2018-02-24 DIAGNOSIS — Z23 Encounter for immunization: Secondary | ICD-10-CM | POA: Diagnosis not present

## 2018-02-24 DIAGNOSIS — Z7901 Long term (current) use of anticoagulants: Secondary | ICD-10-CM

## 2018-02-24 DIAGNOSIS — I5022 Chronic systolic (congestive) heart failure: Secondary | ICD-10-CM

## 2018-02-24 NOTE — Progress Notes (Signed)
Provider:  Rexene Edison. Mariea Clonts, D.O., C.M.D. Location:  Occupational psychologist of Service:  Clinic (12)  Previous PCP: Bensimhon, Shaune Pascal, MD Patient Care Team: Bensimhon, Shaune Pascal, MD as PCP - General (Cardiology) Gayland Curry, DO as PCP - Internal Medicine (Geriatric Medicine) Royal Hawthorn, NP as Nurse Practitioner (Nurse Practitioner)  Extended Emergency Contact Information Primary Emergency Contact: Ferreri,Roger Address: [address]          [place] [address] [name] Phone: [phone] Mobile Phone: [phone] Relation: Brother  Code Status: DNR Goals of Care: Advanced Directive information Advanced Directives 02/24/2018  Does Patient Have a Medical Advance Directive? Yes  Type of Advance Directive Out of facility DNR (pink MOST or yellow form);Living will;Healthcare Power of Attorney  Does patient want to make changes to medical advance directive? No - Patient declined  Copy of [place] in Chart? Yes  Would patient like information on creating a medical advance directive? -  Pre-existing out of facility DNR order (yellow form or pink MOST form) Yellow form placed in chart (order not valid for inpatient use)   Chief Complaint  Patient presents with  . Establish Care    new patient    HPI: Patient is a 82 y.o. male seen today to establish with [facility].  Records have been requested from Dr. Willey Blade and received.  He has a h/o BPH without LUTS, chronic systolic heart failure with cardiomyopathy, hypothyroidism, LBBB, atrial flutter, CKDIV, COPD, hyperlipidemia, scoliosis.  Mr and Mrs. Tankard have been married for 53 years and lived in a home in [place] prior to their move.  Dr. Willey Blade and his wife have assisted in making this move and are actively involved in their care.    Tanor is doing quite well.  He reports no concerns whatsoever.  They are adjusting to assisted living at [facility].    He has had  difficulty with his vision and it's been difficult to get accurate eye testing for him because of problems responding to the questions asked during the exam.  He has some developmental disability, as does his wife.  I suggested he get his vision checked with Dr. Ellie Lunch.  There were apparently problems with getting the best lenses for him in the past.  He not had any recent falls, walks without assistive device.  He does have scoliosis.   He denies chest pain, shortness of breath, orthopnea, PND, and palpitations.  His CHF is followed by Dr. Haroldine Laws in the CHF clinic.    He has hypothyroidism and staff have been waking him up way too early to take this. He does not eat breakfast until at least 8am.  They like to sleep in a little bit.    He has not had his shingrix vaccine series or recent labs.  Past Medical History:  Diagnosis Date  . Arthritis   . Atrial fibrillation ([place])   . Atrial flutter ([place])   . Cardiomyopathy    Nonischemic, LVEF 20%  . CHF (congestive heart failure) ([place])   . Chronic renal insufficiency   . COPD (chronic obstructive pulmonary disease) ([place])   . Hypothyroidism   . Left bundle branch block   . Mixed hyperlipidemia   . Renal failure   . Scoliosis    Past Surgical History:  Procedure Laterality Date  . EYE SURGERY    . LAPAROSCOPIC CHOLECYSTECTOMY  2008  . left foot surgery  1998  . MASS EXCISION Left 06/26/2016  Procedure: EXCISION Mucoid tumor;  Surgeon: Daryll Brod, MD;  Location: [facility];  Service: Orthopedics;  Laterality: Left;  FAB  . Right inguinal herniorrhaphy    . ROTATION FLAP Left 06/26/2016   Procedure: debridement of distal interphalangeal possible, ROTATION FLAP left index finger;  Surgeon: Daryll Brod, MD;  Location: [facility];  Service: Orthopedics;  Laterality: Left;  . SKIN CANCER REMOVED  LEFT EAR    . TONSILLECTOMY      Social History   Socioeconomic History  . Marital status: Married    Spouse name: Not on file  . Number of  children: Not on file  . Years of education: Not on file  . Highest education level: Not on file  Occupational History  . Occupation: Retired    Comment: Previously worked at [employer]  . Financial resource strain: Not on file  . Food insecurity:    Worry: Not on file    Inability: Not on file  . Transportation needs:    Medical: Not on file    Non-medical: Not on file  Tobacco Use  . Smoking status: Never Smoker  . Smokeless tobacco: Never Used  Substance and Sexual Activity  . Alcohol use: Not Currently  . Drug use: No  . Sexual activity: Not on file  Lifestyle  . Physical activity:    Days per week: Not on file    Minutes per session: Not on file  . Stress: Not on file  Relationships  . Social connections:    Talks on phone: Not on file    Gets together: Not on file    Attends religious service: Not on file    Active member of club or organization: Not on file    Attends meetings of clubs or organizations: Not on file    Relationship status: Not on file  Other Topics Concern  . Not on file  Social History Narrative   Tobacco use, amount per day now: No      Past tobacco use, amount per day: No      How many years did you use tobacco: No      Alcohol use (drinks per week): No      Diet: Low Salt       Do you drink/eat things with caffeine? Tea      Marital status: Married             What year were you married? 1975      Do you live in a house, apartment, assisted living, condo, trailer?      Is it one or more stories?      How many persons live in your home?      Do you have any pets in your home?      Current or past profession?      Do you exercise?             How often?      Do you have a living will? Yes      Do you have a DNR form? Yes           If not, do you want to discuss one?      Do you have signed POA/HPOA forms? Yes               reports that he has never smoked. He has never used smokeless tobacco. He reports that  he drank alcohol. He reports that he does not use  drugs.  Functional Status Survey:    Family History  Problem Relation Age of Onset  . Heart disease Father        Died age 58  . Stroke Mother        Died age 49  . Diabetes Mother   . Pancreatic cancer Brother   . Arthritis Brother   . Throat cancer Brother   . Arthritis Brother   . Diabetes Sister   . Seizures Sister     Health Maintenance  Topic Date Due  . Samul Dada  08/05/1955  . PNA vac Low Risk Adult (2 of 2 - PPSV23) 02/06/2017  . INFLUENZA VACCINE  04/15/2018    No Known Allergies  Outpatient Encounter Medications as of 02/24/2018  Medication Sig  . amiodarone (PACERONE) 200 MG tablet TAKE 1 TABLET BY MOUTH ONCE DAILY  . aspirin EC 81 MG tablet Take 81 mg by mouth daily.  . betamethasone dipropionate (DIPROLENE) 0.05 % cream Apply topically 2 (two) times daily.  . carvedilol (COREG) 3.125 MG tablet TAKE 1 TABLET BY MOUTH TWICE DAILY WITH FOOD  . ELIQUIS 5 MG TABS tablet TAKE 1 TABLET BY MOUTH TWICE DAILY  . furosemide (LASIX) 40 MG tablet Take 1 tablet (40 mg total) by mouth every other day.  . hydrALAZINE (APRESOLINE) 25 MG tablet TAKE 1 TABLET BY MOUTH THREE TIMES DAILY  . isosorbide mononitrate (IMDUR) 30 MG 24 hr tablet TAKE 1 TABLET BY MOUTH ONCE DAILY IN THE MORNING  . levothyroxine (SYNTHROID, LEVOTHROID) 175 MCG tablet Take 175 mcg by mouth daily before breakfast.   . loratadine (CLARITIN) 10 MG tablet Take 10 mg by mouth daily.  [name] losartan (COZAAR) 50 MG tablet TAKE 1 TABLET BY MOUTH ONCE DAILY  . spironolactone (ALDACTONE) 25 MG tablet TAKE ONE TABLET BY MOUTH ONCE DAILY   No facility-administered encounter medications on file as of 02/24/2018.     Review of Systems  Constitutional: Negative for chills, fever, malaise/fatigue and weight loss.  HENT: Negative for congestion.   Eyes: Negative for blurred vision.       Glasses, looks down a lot when walking  Respiratory: Negative for cough,  shortness of breath and wheezing.   Cardiovascular: Negative for chest pain, palpitations and leg swelling.  Gastrointestinal: Negative for abdominal pain, blood in stool, constipation and melena.  Genitourinary: Negative for dysuria.  Musculoskeletal: Negative for back pain, falls, joint pain, myalgias and neck pain.  Skin: Positive for itching and rash.       Uses a cream for rash on arms that comes and goes  Neurological: Negative for dizziness and loss of consciousness.       Dysarthric speech lifelong  Endo/Heme/Allergies: Bruises/bleeds easily.  Psychiatric/Behavioral: Negative for depression and memory loss. The patient is not nervous/anxious and does not have insomnia.     Vitals:   02/24/18 1451  BP: 120/60  Pulse: (!) 54  Temp: 98.1 F (36.7 C)  TempSrc: Oral  SpO2: 93%  Weight: 156 lb (70.8 kg)  Height: 5\' 4"  (1.626 m)   Body mass index is 26.78 kg/m. Physical Exam  Constitutional: He is oriented to person, place, and time. He appears well-developed and well-nourished. No distress.  HENT:  Head: Normocephalic and atraumatic.  Right Ear: External ear normal.  Left Ear: External ear normal.  Nose: Nose normal.  Mouth/Throat: Oropharynx is clear and moist. No oropharyngeal exudate.  Eyes: Pupils are equal, round, and reactive to light. Conjunctivae and EOM are normal.  wears glasses  Neck: Normal range of motion. Neck supple. No JVD present. No tracheal deviation present. No thyromegaly present.  Cardiovascular: Intact distal pulses.  irreg irreg  Pulmonary/Chest: Effort normal and breath sounds normal. No respiratory distress.  Abdominal: Soft. Bowel sounds are normal. He exhibits no distension and no mass. There is no tenderness. There is no rebound and no guarding.  Lymphadenopathy:    He has no cervical adenopathy.  Neurological: He is alert and oriented to person, place, and time.  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  Capillary refill takes less than 2 seconds. There is erythema.  On papular rash on forearms very minimal at present (using cream)  Psychiatric: He has a normal mood and affect.    Labs reviewed: Basic Metabolic Panel: Recent Labs    04/25/17 0556 07/01/17 0915 10/17/17 0014 02/08/18  NA 140 140 140 140  K 4.1 5.1 4.7 4.7  CL 108 105 106  --   CO2 24 22 26   --   GLUCOSE 99 91 108*  --   BUN 37* 43* 42* 44*  CREATININE 1.97* 2.77* 2.42* 2.4*  CALCIUM 8.0* 8.6 9.0  --    Liver Function Tests: Recent Labs    04/24/17 2047 10/17/17 0014  AST 24 23  ALT 18 20  ALKPHOS 59 82  BILITOT 0.8 0.5  PROT 7.0 8.0  ALBUMIN 3.6 4.0   Recent Labs    10/17/17 0014  LIPASE 84*   No results for input(s): AMMONIA in the last 8760 hours. CBC: Recent Labs    04/24/17 2047 04/25/17 0556 10/17/17 0014  WBC 15.1* 9.4 9.6  NEUTROABS 13.2*  --   --   HGB 12.9* 12.0* 12.9*  HCT 38.9* 35.9* 40.7  MCV 100.5* 100.6* 102.5*  PLT 171 145* 194   Cardiac Enzymes: Recent Labs    10/17/17 1001 10/17/17 1251 10/17/17 1547  TROPONINI 0.10* 0.08* 0.07*   BNP: Invalid input(s): POCBNP Lab Results  Component Value Date   HGBA1C 5.5 10/18/2017   Lab Results  Component Value Date   TSH 0.521 10/18/2017   Assessment/Plan 1. Chronic systolic heart failure (HCC) -stable at this time -follows with cardiology -continue on losartan, coreg, aldactone, hydralazine and nitrate  2. Secondary cardiomyopathy ([place]) -as in #1  3. Atrial flutter, unspecified type ([place]) -rate controlled with coreg; on amiodarone so check TSH and transaminases; followed by cardiology  4. CKD (chronic kidney disease) stage 4, GFR 15-29 ml/min (HCC) -Avoid nephrotoxic agents like nsaids, dose adjust renally excreted meds, hydrate.  5. Long term (current) use of anticoagulants -on eliquis at appropriate dose, no signs of bleeding, does bruise easily, also on asa  6. Rash -manages well with current cream  (betamethasone diproprionate 0.05%)  7. Mixed hyperlipidemia -not on statin therapy at his age; goals are more QOL focused and comfort focused for he and his wife  8.  Need for shingles vaccine -will arrange for him to get series when available from pharmacy   Labs/tests ordered:  Cbc, cmp, flp, tsh next draw  F/u 4 mos med mgt  Myquan Schaumburg L. Sachi Boulay, D.O. [facility] [address], [place] [address] Cell Phone (Mon-Fri 8am-5pm):  [phone] On Call:  [phone] & follow prompts after 5pm & weekends Office Phone:  [phone] Office Fax:  [phone]

## 2018-02-25 DIAGNOSIS — R7989 Other specified abnormal findings of blood chemistry: Secondary | ICD-10-CM | POA: Diagnosis not present

## 2018-02-25 DIAGNOSIS — E039 Hypothyroidism, unspecified: Secondary | ICD-10-CM | POA: Diagnosis not present

## 2018-02-25 DIAGNOSIS — R799 Abnormal finding of blood chemistry, unspecified: Secondary | ICD-10-CM | POA: Diagnosis not present

## 2018-02-25 DIAGNOSIS — D649 Anemia, unspecified: Secondary | ICD-10-CM | POA: Diagnosis not present

## 2018-02-25 DIAGNOSIS — R899 Unspecified abnormal finding in specimens from other organs, systems and tissues: Secondary | ICD-10-CM | POA: Diagnosis not present

## 2018-02-25 DIAGNOSIS — E785 Hyperlipidemia, unspecified: Secondary | ICD-10-CM | POA: Diagnosis not present

## 2018-02-25 LAB — CBC AND DIFFERENTIAL
HCT: 36 — AB (ref 41–53)
Hemoglobin: 12.2 — AB (ref 13.5–17.5)
Platelets: 174 (ref 150–399)
WBC: 5.8

## 2018-02-25 LAB — BASIC METABOLIC PANEL
BUN: 40 — AB (ref 4–21)
Creatinine: 2.4 — AB (ref 0.6–1.3)
Glucose: 96
Potassium: 4.9 (ref 3.4–5.3)
Sodium: 141 (ref 137–147)

## 2018-02-25 LAB — TSH: TSH: 0.2 — AB (ref 0.41–5.90)

## 2018-02-25 LAB — HEPATIC FUNCTION PANEL
ALT: 15 (ref 10–40)
AST: 21 (ref 14–40)
Alkaline Phosphatase: 74 (ref 25–125)
Bilirubin, Total: 0.4

## 2018-02-25 LAB — LIPID PANEL
Cholesterol: 173 (ref 0–200)
HDL: 55 (ref 35–70)
LDL Cholesterol: 95
Triglycerides: 117 (ref 40–160)

## 2018-03-03 ENCOUNTER — Non-Acute Institutional Stay: Payer: Medicare HMO

## 2018-03-03 DIAGNOSIS — Z0000 Encounter for general adult medical examination without abnormal findings: Secondary | ICD-10-CM | POA: Diagnosis not present

## 2018-03-03 NOTE — Progress Notes (Signed)
Subjective:   [name] is a 82 y.o. male who presents for an Initial Medicare Annual Wellness Visit at [facility]   Objective:    Today's Vitals   03/03/18 1122  BP: 132/60  Pulse: (!) 47  Temp: (!) 97.2 F (36.2 C)  TempSrc: Oral  SpO2: 96%  Weight: 156 lb (70.8 kg)  Height: 5\' 4"  (1.626 m)   Body mass index is 26.78 kg/m.  Advanced Directives 03/03/2018 02/24/2018 10/17/2017 10/16/2017 08/19/2017 04/25/2017 06/26/2016  Does Patient Have a Medical Advance Directive? Yes Yes - No No;Yes Yes -  Type of Advance Directive Out of facility DNR (pink MOST or yellow form);Living will;Healthcare Power of Attorney Out of facility DNR (pink MOST or yellow form);Living will;Healthcare Power of [place];Living will [place] -  Does patient want to make changes to medical advance directive? No - Patient declined No - Patient declined - - - No - Patient declined -  Copy of [place] in Chart? Yes Yes - - No - copy requested No - copy requested -  Would patient like information on creating a medical advance directive? - - No - Patient declined No - Patient declined No - Patient declined - No - patient declined information  Pre-existing out of facility DNR order (yellow form or pink MOST form) Yellow form placed in chart (order not valid for inpatient use) Yellow form placed in chart (order not valid for inpatient use) - - - - -    Current Medications (verified) Outpatient Encounter Medications as of 03/03/2018  Medication Sig  . amiodarone (PACERONE) 200 MG tablet TAKE 1 TABLET BY MOUTH ONCE DAILY  . aspirin EC 81 MG tablet Take 81 mg by mouth daily.  . betamethasone dipropionate (DIPROLENE) 0.05 % cream Apply topically 2 (two) times daily.  . carvedilol (COREG) 3.125 MG tablet TAKE 1 TABLET BY MOUTH TWICE DAILY WITH FOOD  . ELIQUIS 5 MG TABS tablet TAKE 1 TABLET BY MOUTH TWICE DAILY  . furosemide (LASIX)  40 MG tablet Take 1 tablet (40 mg total) by mouth every other day.  . hydrALAZINE (APRESOLINE) 25 MG tablet TAKE 1 TABLET BY MOUTH THREE TIMES DAILY  . isosorbide mononitrate (IMDUR) 30 MG 24 hr tablet TAKE 1 TABLET BY MOUTH ONCE DAILY IN THE MORNING  . levothyroxine (SYNTHROID, LEVOTHROID) 175 MCG tablet Take 175 mcg by mouth daily before breakfast.   . loratadine (CLARITIN) 10 MG tablet Take 10 mg by mouth daily.  [name] losartan (COZAAR) 50 MG tablet TAKE 1 TABLET BY MOUTH ONCE DAILY  . spironolactone (ALDACTONE) 25 MG tablet TAKE ONE TABLET BY MOUTH ONCE DAILY   No facility-administered encounter medications on file as of 03/03/2018.     Allergies (verified) Patient has no known allergies.   History: Past Medical History:  Diagnosis Date  . Arthritis   . Atrial fibrillation ([place])   . Atrial flutter ([place])   . Cardiomyopathy    Nonischemic, LVEF 20%  . CHF (congestive heart failure) ([place])   . Chronic renal insufficiency   . COPD (chronic obstructive pulmonary disease) ([place])   . Fracture of unspecified part of left clavicle, initial encounter for closed fracture   . Hypothyroidism   . Left ankle sprain   . Left bundle branch block   . Mixed hyperlipidemia   . Renal failure   . Scoliosis    Past Surgical History:  Procedure Laterality Date  . EYE  SURGERY    . LAPAROSCOPIC CHOLECYSTECTOMY  2008  . left foot surgery  1998  . MASS EXCISION Left 06/26/2016   Procedure: EXCISION Mucoid tumor;  Surgeon: Daryll Brod, MD;  Location: [facility];  Service: Orthopedics;  Laterality: Left;  FAB  . Right inguinal herniorrhaphy    . ROTATION FLAP Left 06/26/2016   Procedure: debridement of distal interphalangeal possible, ROTATION FLAP left index finger;  Surgeon: Daryll Brod, MD;  Location: [facility];  Service: Orthopedics;  Laterality: Left;  . SKIN CANCER REMOVED  LEFT EAR    . TONSILLECTOMY     Family History  Problem Relation Age of Onset  . Heart disease Father        Died age 65  . Stroke  Mother        Died age 58  . Diabetes Mother   . Pancreatic cancer Brother   . Arthritis Brother   . Throat cancer Brother   . Arthritis Brother   . Diabetes Sister   . Seizures Sister    Social History   Socioeconomic History  . Marital status: Married    Spouse name: Not on file  . Number of children: Not on file  . Years of education: Not on file  . Highest education level: Not on file  Occupational History  . Occupation: Retired    Comment: Previously worked at [employer]  . Financial resource strain: Not hard at all  . Food insecurity:    Worry: Never true    Inability: Never true  . Transportation needs:    Medical: No    Non-medical: No  Tobacco Use  . Smoking status: Never Smoker  . Smokeless tobacco: Never Used  Substance and Sexual Activity  . Alcohol use: Not Currently  . Drug use: No  . Sexual activity: Not on file  Lifestyle  . Physical activity:    Days per week: 7 days    Minutes per session: 20 min  . Stress: Not at all  Relationships  . Social connections:    Talks on phone: Twice a week    Gets together: Twice a week    Attends religious service: Never    Active member of club or organization: No    Attends meetings of clubs or organizations: Never    Relationship status: Married  Other Topics Concern  . Not on file  Social History Narrative   Tobacco use, amount per day now: No      Past tobacco use, amount per day: No      How many years did you use tobacco: No      Alcohol use (drinks per week): No      Diet: Low Salt       Do you drink/eat things with caffeine? Tea      Marital status: Married             What year were you married? 1975      Do you live in a house, apartment, assisted living, condo, trailer?      Is it one or more stories?      How many persons live in your home?      Do you have any pets in your home?      Current or past profession?      Do you exercise?             How often?      Do  you have a  living will? Yes      Do you have a DNR form? Yes           If not, do you want to discuss one?      Do you have signed POA/HPOA forms? Yes              Tobacco Counseling Counseling given: Not Answered   Clinical Intake:  Pre-visit preparation completed: No  Pain : No/denies pain     Nutritional Risks: None Diabetes: No  How often do you need to have someone help you when you read instructions, pamphlets, or other written materials from your doctor or pharmacy?: 2 - Rarely What is the last grade level you completed in school?: HS  Interpreter Needed?: No  Information entered by :: Tyson Dense, RN  Activities of Daily Living In your present state of health, do you have any difficulty performing the following activities: 03/03/2018 10/17/2017  Hearing? N -  Vision? N -  Difficulty concentrating or making decisions? Y -  Comment - -  Walking or climbing stairs? Y -  Dressing or bathing? N -  Doing errands, shopping? Y N  Preparing Food and eating ? Y -  Using the Toilet? N -  In the past six months, have you accidently leaked urine? N -  Do you have problems with loss of bowel control? N -  Managing your Medications? Y -  Managing your Finances? Y -  Housekeeping or managing your Housekeeping? Y -  Some recent data might be hidden     Immunizations and Health Maintenance Immunization History  Administered Date(s) Administered  . Influenza Inj Mdck Quad Pf 07/04/2016  . Influenza Inj Mdck Quad With Preservative 06/18/2017  . Influenza Split 06/02/2014, 06/02/2014, 06/14/2015  . Pneumococcal Conjugate-13 02/07/2016   Health Maintenance Due  Topic Date Due  . TETANUS/TDAP  08/05/1955  . PNA vac Low Risk Adult (2 of 2 - PPSV23) 02/06/2017    Patient Care Team: Bensimhon, Shaune Pascal, MD as PCP - General (Cardiology) Gayland Curry, DO as PCP - Internal Medicine (Geriatric Medicine) Royal Hawthorn, NP as Nurse Practitioner (Nurse  Practitioner)  Indicate any recent Medical Services you may have received from other than [facility] providers in the past year (date may be approximate).    Assessment:   This is a routine wellness examination for [place].  Hearing/Vision screen Hearing Screening Comments: Patient stated no issues with hearing Vision Screening Comments: Sees eye doctor annually.   Dietary issues and exercise activities discussed: Current Exercise Habits: Home exercise routine, Type of exercise: walking, Time (Minutes): 20, Frequency (Times/Week): 7, Weekly Exercise (Minutes/Week): 140, Intensity: Mild, Exercise limited by: None identified  Goals    None     Depression Screen PHQ 2/9 Scores 03/03/2018 02/24/2018  PHQ - 2 Score 0 0    Fall Risk Fall Risk  03/03/2018 02/24/2018  Falls in the past year? Yes No  Number falls in past yr: 1 -  Injury with Fall? Yes -  Comment L arm in sling -    Is the patient's home free of loose throw rugs in walkways, pet beds, electrical cords, etc?   yes      Grab bars in the bathroom? yes      Handrails on the stairs?   yes      Adequate lighting?   yes  Timed Get Up and Go performed: 25 seconds  Cognitive Function: completed within last year  Screening Tests Health Maintenance  Topic Date Due  . TETANUS/TDAP  08/05/1955  . PNA vac Low Risk Adult (2 of 2 - PPSV23) 02/06/2017  . INFLUENZA VACCINE  04/15/2018    Qualifies for Shingles Vaccine? Yes  Cancer Screenings: Lung: Low Dose CT Chest recommended if Age 56-80 years, 30 pack-year currently smoking OR have quit w/in 15years. Patient does not qualify. Colorectal: up to date  Additional Screenings: Hepatitis C Screening: declined PNA 23 due: ordered TDAP PPI:[id number]      Plan:    I have personally reviewed and addressed the Medicare Annual Wellness questionnaire and have noted the following in the patient's chart:  A. Medical and social history B. Use of alcohol, tobacco or illicit drugs   C. Current medications and supplements D. Functional ability and status E.  Nutritional status F.  Physical activity G. Advance directives H. List of other physicians I.  Hospitalizations, surgeries, and ER visits in previous 12 months J.  [place] to include hearing, vision, cognitive, depression L. Referrals and appointments - none  In addition, I have reviewed and discussed with patient certain preventive protocols, quality metrics, and best practice recommendations. A written personalized care plan for preventive services as well as general preventive health recommendations were provided to patient.  See attached scanned questionnaire for additional information.   Signed,   Tyson Dense, RN Nurse Health Advisor  Patient Concerns: None

## 2018-03-03 NOTE — Patient Instructions (Addendum)
[name] , Thank you for taking time to come for your Medicare Wellness Visit. I appreciate your ongoing commitment to your health goals. Please review the following plan we discussed and let me know if I can assist you in the future.   Screening recommendations/referrals: Colonoscopy excluded, over age 80 Recommended yearly ophthalmology/optometry visit for glaucoma screening and checkup Recommended yearly dental visit for hygiene and checkup  Vaccinations: Influenza vaccine up to date, due 2019 fall season Pneumococcal vaccine due, ordered Tdap vaccine due, ordered Shingles vaccine not in past records, previously ordered  Advanced directives: in chart  Conditions/risks identified: none  Next appointment: Dr. Mariea Clonts 06/16/2018 @ 3:30pm  Preventive Care 64 Years and Older, Male Preventive care refers to lifestyle choices and visits with your health care provider that can promote health and wellness. What does preventive care include?  A yearly physical exam. This is also called an annual well check.  Dental exams once or twice a year.  Routine eye exams. Ask your health care provider how often you should have your eyes checked.  Personal lifestyle choices, including:  Daily care of your teeth and gums.  Regular physical activity.  Eating a healthy diet.  Avoiding tobacco and drug use.  Limiting alcohol use.  Practicing safe sex.  Taking low doses of aspirin every day.  Taking vitamin and mineral supplements as recommended by your health care provider. What happens during an annual well check? The services and screenings done by your health care provider during your annual well check will depend on your age, overall health, lifestyle risk factors, and family history of disease. Counseling  Your health care provider may ask you questions about your:  Alcohol use.  Tobacco use.  Drug use.  Emotional well-being.  Home and relationship well-being.  Sexual  activity.  Eating habits.  History of falls.  Memory and ability to understand (cognition).  Work and work Statistician. Screening  You may have the following tests or measurements:  Height, weight, and BMI.  Blood pressure.  Lipid and cholesterol levels. These may be checked every 5 years, or more frequently if you are over 35 years old.  Skin check.  Lung cancer screening. You may have this screening every year starting at age 50 if you have a 30-pack-year history of smoking and currently smoke or have quit within the past 15 years.  Fecal occult blood test (FOBT) of the stool. You may have this test every year starting at age 7.  Flexible sigmoidoscopy or colonoscopy. You may have a sigmoidoscopy every 5 years or a colonoscopy every 10 years starting at age 50.  Prostate cancer screening. Recommendations will vary depending on your family history and other risks.  Hepatitis C blood test.  Hepatitis B blood test.  Sexually transmitted disease (STD) testing.  Diabetes screening. This is done by checking your blood sugar (glucose) after you have not eaten for a while (fasting). You may have this done every 1-3 years.  Abdominal aortic aneurysm (AAA) screening. You may need this if you are a current or former smoker.  Osteoporosis. You may be screened starting at age 70 if you are at high risk. Talk with your health care provider about your test results, treatment options, and if necessary, the need for more tests. Vaccines  Your health care provider may recommend certain vaccines, such as:  Influenza vaccine. This is recommended every year.  Tetanus, diphtheria, and acellular pertussis (Tdap, Td) vaccine. You may need a Td booster every 10  years.  Zoster vaccine. You may need this after age 92.  Pneumococcal 13-valent conjugate (PCV13) vaccine. One dose is recommended after age 61.  Pneumococcal polysaccharide (PPSV23) vaccine. One dose is recommended after age  72. Talk to your health care provider about which screenings and vaccines you need and how often you need them. This information is not intended to replace advice given to you by your health care provider. Make sure you discuss any questions you have with your health care provider. Document Released: 09/28/2015 Document Revised: 05/21/2016 Document Reviewed: 07/03/2015 Elsevier Interactive Patient Education  2017 [place] Prevention in the Home Falls can cause injuries. They can happen to people of all ages. There are many things you can do to make your home safe and to help prevent falls. What can I do on the outside of my home?  Regularly fix the edges of walkways and driveways and fix any cracks.  Remove anything that might make you trip as you walk through a door, such as a raised step or threshold.  Trim any bushes or trees on the path to your home.  Use bright outdoor lighting.  Clear any walking paths of anything that might make someone trip, such as rocks or tools.  Regularly check to see if handrails are loose or broken. Make sure that both sides of any steps have handrails.  Any raised decks and porches should have guardrails on the edges.  Have any leaves, snow, or ice cleared regularly.  Use sand or salt on walking paths during winter.  Clean up any spills in your garage right away. This includes oil or grease spills. What can I do in the bathroom?  Use night lights.  Install grab bars by the toilet and in the tub and shower. Do not use towel bars as grab bars.  Use non-skid mats or decals in the tub or shower.  If you need to sit down in the shower, use a plastic, non-slip stool.  Keep the floor dry. Clean up any water that spills on the floor as soon as it happens.  Remove soap buildup in the tub or shower regularly.  Attach bath mats securely with double-sided non-slip rug tape.  Do not have throw rugs and other things on the floor that can make  you trip. What can I do in the bedroom?  Use night lights.  Make sure that you have a light by your bed that is easy to reach.  Do not use any sheets or blankets that are too big for your bed. They should not hang down onto the floor.  Have a firm chair that has side arms. You can use this for support while you get dressed.  Do not have throw rugs and other things on the floor that can make you trip. What can I do in the kitchen?  Clean up any spills right away.  Avoid walking on wet floors.  Keep items that you use a lot in easy-to-reach places.  If you need to reach something above you, use a strong step stool that has a grab bar.  Keep electrical cords out of the way.  Do not use floor polish or wax that makes floors slippery. If you must use wax, use non-skid floor wax.  Do not have throw rugs and other things on the floor that can make you trip. What can I do with my stairs?  Do not leave any items on the stairs.  Make sure that  there are handrails on both sides of the stairs and use them. Fix handrails that are broken or loose. Make sure that handrails are as long as the stairways.  Check any carpeting to make sure that it is firmly attached to the stairs. Fix any carpet that is loose or worn.  Avoid having throw rugs at the top or bottom of the stairs. If you do have throw rugs, attach them to the floor with carpet tape.  Make sure that you have a light switch at the top of the stairs and the bottom of the stairs. If you do not have them, ask someone to add them for you. What else can I do to help prevent falls?  Wear shoes that:  Do not have high heels.  Have rubber bottoms.  Are comfortable and fit you well.  Are closed at the toe. Do not wear sandals.  If you use a stepladder:  Make sure that it is fully opened. Do not climb a closed stepladder.  Make sure that both sides of the stepladder are locked into place.  Ask someone to hold it for you, if  possible.  Clearly mark and make sure that you can see:  Any grab bars or handrails.  First and last steps.  Where the edge of each step is.  Use tools that help you move around (mobility aids) if they are needed. These include:  Canes.  Walkers.  Scooters.  Crutches.  Turn on the lights when you go into a dark area. Replace any light bulbs as soon as they burn out.  Set up your furniture so you have a clear path. Avoid moving your furniture around.  If any of your floors are uneven, fix them.  If there are any pets around you, be aware of where they are.  Review your medicines with your doctor. Some medicines can make you feel dizzy. This can increase your chance of falling. Ask your doctor what other things that you can do to help prevent falls. This information is not intended to replace advice given to you by your health care provider. Make sure you discuss any questions you have with your health care provider. Document Released: 06/28/2009 Document Revised: 02/07/2016 Document Reviewed: 10/06/2014 Elsevier Interactive Patient Education  2017 [employer].

## 2018-03-09 ENCOUNTER — Encounter: Payer: Self-pay | Admitting: Internal Medicine

## 2018-03-30 DIAGNOSIS — D485 Neoplasm of uncertain behavior of skin: Secondary | ICD-10-CM | POA: Diagnosis not present

## 2018-03-30 DIAGNOSIS — L853 Xerosis cutis: Secondary | ICD-10-CM | POA: Diagnosis not present

## 2018-03-30 DIAGNOSIS — L218 Other seborrheic dermatitis: Secondary | ICD-10-CM | POA: Diagnosis not present

## 2018-03-30 DIAGNOSIS — L814 Other melanin hyperpigmentation: Secondary | ICD-10-CM | POA: Diagnosis not present

## 2018-03-30 DIAGNOSIS — D044 Carcinoma in situ of skin of scalp and neck: Secondary | ICD-10-CM | POA: Diagnosis not present

## 2018-06-12 DIAGNOSIS — E018 Other iodine-deficiency related thyroid disorders and allied conditions: Secondary | ICD-10-CM | POA: Diagnosis not present

## 2018-06-12 DIAGNOSIS — E039 Hypothyroidism, unspecified: Secondary | ICD-10-CM | POA: Diagnosis not present

## 2018-06-12 LAB — TSH: TSH: 0.58 (ref <=5.90)

## 2018-06-15 ENCOUNTER — Encounter: Payer: Self-pay | Admitting: Internal Medicine

## 2018-06-16 ENCOUNTER — Encounter: Payer: Self-pay | Admitting: Internal Medicine

## 2018-06-16 ENCOUNTER — Non-Acute Institutional Stay: Payer: Medicare HMO | Admitting: Internal Medicine

## 2018-06-16 VITALS — BP 118/60 | HR 44 | Temp 98.0°F | Ht 64.0 in | Wt 155.0 lb

## 2018-06-16 DIAGNOSIS — E039 Hypothyroidism, unspecified: Secondary | ICD-10-CM

## 2018-06-16 DIAGNOSIS — I5022 Chronic systolic (congestive) heart failure: Secondary | ICD-10-CM | POA: Diagnosis not present

## 2018-06-16 DIAGNOSIS — C4442 Squamous cell carcinoma of skin of scalp and neck: Secondary | ICD-10-CM

## 2018-06-16 DIAGNOSIS — N184 Chronic kidney disease, stage 4 (severe): Secondary | ICD-10-CM | POA: Diagnosis not present

## 2018-06-16 DIAGNOSIS — M25572 Pain in left ankle and joints of left foot: Secondary | ICD-10-CM | POA: Diagnosis not present

## 2018-06-16 DIAGNOSIS — E782 Mixed hyperlipidemia: Secondary | ICD-10-CM

## 2018-06-16 DIAGNOSIS — I429 Cardiomyopathy, unspecified: Secondary | ICD-10-CM

## 2018-06-16 DIAGNOSIS — Z7901 Long term (current) use of anticoagulants: Secondary | ICD-10-CM | POA: Diagnosis not present

## 2018-06-16 DIAGNOSIS — I4892 Unspecified atrial flutter: Secondary | ICD-10-CM | POA: Diagnosis not present

## 2018-06-16 DIAGNOSIS — I872 Venous insufficiency (chronic) (peripheral): Secondary | ICD-10-CM

## 2018-06-16 DIAGNOSIS — G8929 Other chronic pain: Secondary | ICD-10-CM
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
## 2018-08-19 DIAGNOSIS — L814 Other melanin hyperpigmentation: Secondary | ICD-10-CM | POA: Diagnosis not present

## 2018-08-19 DIAGNOSIS — D044 Carcinoma in situ of skin of scalp and neck: Secondary | ICD-10-CM | POA: Diagnosis not present

## 2018-08-19 DIAGNOSIS — L853 Xerosis cutis: Secondary | ICD-10-CM | POA: Diagnosis not present

## 2018-08-19 DIAGNOSIS — L218 Other seborrheic dermatitis: Secondary | ICD-10-CM | POA: Diagnosis not present

## 2018-08-19 DIAGNOSIS — L570 Actinic keratosis: Secondary | ICD-10-CM | POA: Diagnosis not present

## 2018-09-22 ENCOUNTER — Encounter: Payer: Self-pay | Admitting: Internal Medicine

## 2018-09-22 ENCOUNTER — Non-Acute Institutional Stay: Payer: Medicare HMO | Admitting: Internal Medicine

## 2018-09-22 VITALS — BP 100/60 | HR 53 | Temp 97.7°F | Ht 64.0 in | Wt 159.0 lb

## 2018-09-22 DIAGNOSIS — I5022 Chronic systolic (congestive) heart failure: Secondary | ICD-10-CM

## 2018-09-22 DIAGNOSIS — I447 Left bundle-branch block, unspecified: Secondary | ICD-10-CM | POA: Diagnosis not present

## 2018-09-22 DIAGNOSIS — Z7901 Long term (current) use of anticoagulants: Secondary | ICD-10-CM | POA: Diagnosis not present

## 2018-09-22 DIAGNOSIS — R42 Dizziness and giddiness: Secondary | ICD-10-CM

## 2018-09-22 DIAGNOSIS — D649 Anemia, unspecified: Secondary | ICD-10-CM | POA: Diagnosis not present

## 2018-09-22 DIAGNOSIS — J0100 Acute maxillary sinusitis, unspecified: Secondary | ICD-10-CM

## 2018-09-22 DIAGNOSIS — R001 Bradycardia, unspecified: Secondary | ICD-10-CM | POA: Diagnosis not present

## 2018-09-22 DIAGNOSIS — N184 Chronic kidney disease, stage 4 (severe): Secondary | ICD-10-CM | POA: Diagnosis not present

## 2018-09-22 DIAGNOSIS — M6281 Muscle weakness (generalized): Secondary | ICD-10-CM | POA: Diagnosis not present

## 2018-09-22 DIAGNOSIS — R231 Pallor: Secondary | ICD-10-CM | POA: Diagnosis not present

## 2018-09-22 LAB — BASIC METABOLIC PANEL
BUN: 44 — AB (ref 4–21)
Creatinine: 2.6 — AB (ref 0.6–1.3)
Potassium: 4.8 (ref 3.4–5.3)
Sodium: 137 (ref 137–147)

## 2018-09-22 NOTE — Progress Notes (Signed)
Location:  [facility] clinic Provider:  Fredrich Cory L. Mariea Clonts, D.O., C.M.D.  Code Status: Has DNR completed Goals of Care:  Advanced Directives 09/22/2018  Does Patient Have a Medical Advance Directive? Yes  Type of Advance Directive Out of facility DNR (pink MOST or yellow form);Healthcare Power of Attorney  Does patient want to make changes to medical advance directive? No - Patient declined  Copy of [place] in Chart? Yes - validated most recent copy scanned in chart (See row information)  Would patient like information on creating a medical advance directive? -  Pre-existing out of facility DNR order (yellow form or pink MOST form) Yellow form placed in chart (order not valid for inpatient use)   Chief Complaint  Patient presents with  . Medical Management of Chronic Issues    37mth follow-up    HPI: Patient is a 83 y.o. male seen today for medical management of chronic diseases.    Patient feels warm, is diaphoretic, his teeth hurt, his nose is running and he's got sinus congestion with some aching of his face.  An EKG was done due to pallor, hypotension 98/58 and HR 40.  He remained afebrile.  EKG shows sinus brady at 40 with first degree AV block and complete LBBB.  He's been to the restroom three times in the past hour to urinate, as well.  He is not speaking much and does not seem himself.  Perrin Smack is concerned, as well.  They also asked about the date of his mohs surgery for his scalp.    Past Medical History:  Diagnosis Date  . Arthritis   . Atrial fibrillation ([place])   . Atrial flutter ([place])   . Cardiomyopathy    Nonischemic, LVEF 20%  . CHF (congestive heart failure) ([place])   . Chronic renal insufficiency   . COPD (chronic obstructive pulmonary disease) ([place])   . Fracture of unspecified part of left clavicle, initial encounter for closed fracture   . Hypothyroidism   . Left ankle sprain   . Left bundle branch block   . Mixed hyperlipidemia   . Renal failure     . Scoliosis     Past Surgical History:  Procedure Laterality Date  . EYE SURGERY    . LAPAROSCOPIC CHOLECYSTECTOMY  2008  . left foot surgery  1998  . MASS EXCISION Left 06/26/2016   Procedure: EXCISION Mucoid tumor;  Surgeon: Daryll Brod, MD;  Location: [facility];  Service: Orthopedics;  Laterality: Left;  FAB  . Right inguinal herniorrhaphy    . ROTATION FLAP Left 06/26/2016   Procedure: debridement of distal interphalangeal possible, ROTATION FLAP left index finger;  Surgeon: Daryll Brod, MD;  Location: [facility];  Service: Orthopedics;  Laterality: Left;  . SKIN CANCER REMOVED  LEFT EAR    . TONSILLECTOMY      No Known Allergies  Outpatient Encounter Medications as of 09/22/2018  Medication Sig  . amiodarone (PACERONE) 200 MG tablet TAKE 1 TABLET BY MOUTH ONCE DAILY  . ammonium lactate (AMLACTIN) 12 % cream Apply topically as needed for dry skin.  [name] aspirin EC 81 MG tablet Take 81 mg by mouth daily.  . betamethasone dipropionate (DIPROLENE) 0.05 % cream Apply topically 2 (two) times daily.  . carvedilol (COREG) 3.125 MG tablet TAKE 1 TABLET BY MOUTH TWICE DAILY WITH FOOD  . ELIQUIS 5 MG TABS tablet TAKE 1 TABLET BY MOUTH TWICE DAILY  . furosemide (LASIX) 40 MG tablet Take 1 tablet (40 mg total)  by mouth every other day.  . hydrALAZINE (APRESOLINE) 25 MG tablet TAKE 1 TABLET BY MOUTH THREE TIMES DAILY  . hydrocortisone valerate cream (WESTCORT) 0.2 % Apply 1 application topically 2 (two) times daily.  . isosorbide mononitrate (IMDUR) 30 MG 24 hr tablet TAKE 1 TABLET BY MOUTH ONCE DAILY IN THE MORNING  . ketoconazole (NIZORAL) 2 % cream Apply 1 application topically daily.  [name] ketoconazole (NIZORAL) 2 % shampoo Apply 1 application topically 2 (two) times a week.  . levothyroxine (SYNTHROID, LEVOTHROID) 175 MCG tablet Take 175 mcg by mouth daily before breakfast.   . loratadine (CLARITIN) 10 MG tablet Take 10 mg by mouth daily.  [name] losartan (COZAAR) 50 MG tablet TAKE 1 TABLET BY MOUTH ONCE  DAILY  . spironolactone (ALDACTONE) 25 MG tablet TAKE ONE TABLET BY MOUTH ONCE DAILY   No facility-administered encounter medications on file as of 09/22/2018.     Review of Systems:  Review of Systems  Constitutional: Positive for chills, diaphoresis and malaise/fatigue. Negative for fever.  HENT: Positive for congestion, hearing loss and sinus pain. Negative for ear pain and sore throat.   Eyes: Positive for blurred vision.       Glasses  Respiratory: Negative for cough, sputum production, shortness of breath and wheezing.   Cardiovascular: Negative for chest pain, palpitations and leg swelling.  Gastrointestinal: Negative for blood in stool, diarrhea, melena, nausea and vomiting.  Genitourinary: Negative for dysuria.  Musculoskeletal: Positive for back pain and joint pain. Negative for falls.  Skin:       Skin cancer of scalp  Neurological: Positive for dizziness and weakness. Negative for loss of consciousness and headaches.  Psychiatric/Behavioral: Negative for depression.    Health Maintenance  Topic Date Due  . TETANUS/TDAP  03/06/2028  . INFLUENZA VACCINE  Completed  . PNA vac Low Risk Adult  Completed    Physical Exam: Vitals:   09/22/18 1122  BP: 100/60  Pulse: (!) 53  Temp: 97.7 F (36.5 C)  TempSrc: Oral  SpO2: 96%  Weight: 159 lb (72.1 kg)  Height: 5\' 4"  (1.626 m)   Body mass index is 27.29 kg/m. Physical Exam Constitutional:      General: He is not in acute distress.    Appearance: He is ill-appearing and diaphoretic.     Comments: Less talkative, appears ill  HENT:     Head: Normocephalic and atraumatic.     Ears:     Comments: Hearing aids    Nose: Congestion and rhinorrhea present.  Eyes:     Conjunctiva/sclera: Conjunctivae normal.     Comments: glasses  Neck:     Musculoskeletal: Neck supple.  Cardiovascular:     Rate and Rhythm: Regular rhythm. Bradycardia present.     Pulses: Normal pulses.     Heart sounds: Normal heart sounds.    Pulmonary:     Effort: Pulmonary effort is normal.     Breath sounds: Normal breath sounds. No rhonchi or rales.  Abdominal:     General: Bowel sounds are normal.  Musculoskeletal: Normal range of motion.     Comments: Severe kyphoscoliosis, shuffling gait, does not use assistive device, but dizzy and required wheelchair to return to AL  Skin:    General: Skin is warm.     Comments: Skin moist and pale  Neurological:     General: No focal deficit present.     Mental Status: He is alert and oriented to person, place, and time.  Psychiatric:  Mood and Affect: Mood normal.     Labs reviewed: Basic Metabolic Panel: Recent Labs    10/17/17 0014 10/18/17 0818  02/08/18 02/08/18 1016 02/25/18 06/12/18  NA 140  --    < > 140 140 141  --   K 4.7  --    < > 4.7 4.7 4.9  --   CL 106  --   --   --   --   --   --   CO2 26  --   --   --   --   --   --   GLUCOSE 108*  --   --   --   --   --   --   BUN 42*  --    < > 44* 44* 40*  --   CREATININE 2.42*  --    < > 2.4* 2.4* 2.4*  --   CALCIUM 9.0  --   --   --   --   --   --   TSH  --  0.521  --   --   --  0.20* 0.58   < > = values in this interval not displayed.   Liver Function Tests: Recent Labs    10/17/17 0014 02/25/18 0700  AST 23 21  ALT 20 15  ALKPHOS 82 74  BILITOT 0.5  --   PROT 8.0  --   ALBUMIN 4.0  --    Recent Labs    10/17/17 0014  LIPASE 84*   No results for input(s): AMMONIA in the last 8760 hours. CBC: Recent Labs    10/17/17 0014 02/25/18  WBC 9.6 5.8  HGB 12.9* 12.2*  HCT 40.7 36*  MCV 102.5*  --   PLT 194 174   Lipid Panel: Recent Labs    10/18/17 0818 02/25/18  CHOL 164 173  HDL 48 55  LDLCALC 90 95  TRIG 129 117  CHOLHDL 3.4  --    Lab Results  Component Value Date   HGBA1C 5.5 10/18/2017    Procedures since last visit: EKG:  Sinus bradycardia, first deg AV block, complete LBBB  Assessment/Plan 1. Dizziness -acute onset today -reviewed that pulse has been in 40s before  but without dizziness -other new symptoms today of nasal congestion, teeth hurting, warm feeling, sweats with other residents with URIs -will monitor bp and hr and hold rate control meds this afternoon -f/u cbc with diff to ensure not anemic given anticoagulation  2. Acute non-recurrent maxillary sinusitis -will monitor him clinically, may need abx if fever occurs--suspect URI may be cause of dizziness  3. Bradycardia with 31-40 beats per minute -HR 40 on ekg, pt symptomatic -will hold his pm coreg, hydralazine (2pm and 8pm) and monitor bp and HR q 2 h while awake this afternoon and evening  4. CKD (chronic kidney disease) stage 4, GFR 15-29 ml/min (HCC) -f/u bmp stat today  5. Chronic systolic heart failure (HCC) -no signs of acute chf, will hold pm coreg and hydralazine today  6. Left bundle branch block (LBBB) determined by electrocardiography -remains present  7. Long term (current) use of anticoagulants -on baby asa and eliquis -check cbc  Labs/tests ordered:  .cbc with diff, bmp stat, also check UA c+s and flu swab  Next appt:  3 mos routinely but prn if he's not improving   Tarae Wooden L. Maycen Degregory, D.O. [facility] [address], [place] [address] Cell Phone (Mon-Fri  8am-5pm):  [phone] On Call:  [phone] & follow prompts after 5pm & weekends Office Phone:  [phone] Office Fax:  [phone]

## 2018-09-24 ENCOUNTER — Encounter: Payer: Self-pay | Admitting: Internal Medicine

## 2018-09-24 DIAGNOSIS — R531 Weakness: Secondary | ICD-10-CM | POA: Diagnosis not present

## 2018-09-24 DIAGNOSIS — R319 Hematuria, unspecified: Secondary | ICD-10-CM | POA: Diagnosis not present

## 2018-09-24 DIAGNOSIS — R231 Pallor: Secondary | ICD-10-CM | POA: Diagnosis not present

## 2018-09-24 LAB — CBC AND DIFFERENTIAL
HCT: 37 — AB (ref 41–53)
Hemoglobin: 12.7 — AB (ref 13.5–17.5)
Platelets: 203 (ref 150–399)
WBC: 10.5

## 2018-09-29 ENCOUNTER — Encounter: Payer: Self-pay | Admitting: Internal Medicine

## 2018-11-09 ENCOUNTER — Ambulatory Visit (HOSPITAL_COMMUNITY)
Admission: RE | Admit: 2018-11-09 | Discharge: 2018-11-09 | Disposition: A | Discharge status: Home / Self Care | Payer: Medicare HMO | Source: Ambulatory Visit | Attending: Internal Medicine | Admitting: Internal Medicine

## 2018-11-09 VITALS — BP 118/56 | HR 51 | Wt 161.0 lb

## 2018-11-09 DIAGNOSIS — R4189 Other symptoms and signs involving cognitive functions and awareness: Secondary | ICD-10-CM | POA: Diagnosis not present

## 2018-11-09 DIAGNOSIS — I4892 Unspecified atrial flutter: Secondary | ICD-10-CM | POA: Insufficient documentation

## 2018-11-09 DIAGNOSIS — N183 Chronic kidney disease, stage 3 (moderate): Secondary | ICD-10-CM | POA: Diagnosis not present

## 2018-11-09 DIAGNOSIS — E039 Hypothyroidism, unspecified: Secondary | ICD-10-CM | POA: Insufficient documentation

## 2018-11-09 DIAGNOSIS — N179 Acute kidney failure, unspecified: Secondary | ICD-10-CM | POA: Insufficient documentation

## 2018-11-09 DIAGNOSIS — I5022 Chronic systolic (congestive) heart failure: Secondary | ICD-10-CM | POA: Diagnosis not present

## 2018-11-09 DIAGNOSIS — J449 Chronic obstructive pulmonary disease, unspecified: Secondary | ICD-10-CM | POA: Diagnosis not present

## 2018-11-09 DIAGNOSIS — I480 Paroxysmal atrial fibrillation: Secondary | ICD-10-CM | POA: Diagnosis not present

## 2018-11-09 DIAGNOSIS — I2510 Atherosclerotic heart disease of native coronary artery without angina pectoris: Secondary | ICD-10-CM | POA: Insufficient documentation

## 2018-11-09 DIAGNOSIS — M1990 Unspecified osteoarthritis, unspecified site: Secondary | ICD-10-CM | POA: Diagnosis not present

## 2018-11-09 DIAGNOSIS — M419 Scoliosis, unspecified: Secondary | ICD-10-CM | POA: Diagnosis not present

## 2018-11-09 DIAGNOSIS — I429 Cardiomyopathy, unspecified: Secondary | ICD-10-CM | POA: Insufficient documentation

## 2018-11-09 DIAGNOSIS — Z7982 Long term (current) use of aspirin: Secondary | ICD-10-CM | POA: Insufficient documentation

## 2018-11-09 DIAGNOSIS — I447 Left bundle-branch block, unspecified: Secondary | ICD-10-CM | POA: Diagnosis not present

## 2018-11-09 DIAGNOSIS — E782 Mixed hyperlipidemia: Secondary | ICD-10-CM | POA: Insufficient documentation

## 2018-11-09 DIAGNOSIS — Z7901 Long term (current) use of anticoagulants: Secondary | ICD-10-CM | POA: Diagnosis not present

## 2018-11-09 DIAGNOSIS — R001 Bradycardia, unspecified: Secondary | ICD-10-CM | POA: Diagnosis not present

## 2018-11-09 DIAGNOSIS — Z79899 Other long term (current) drug therapy: Secondary | ICD-10-CM | POA: Diagnosis not present

## 2018-11-09 DIAGNOSIS — I4891 Unspecified atrial fibrillation: Secondary | ICD-10-CM | POA: Diagnosis not present

## 2018-11-09 LAB — COMPREHENSIVE METABOLIC PANEL
ALBUMIN: 3.3 g/dL — AB (ref 3.5–5.0)
ALT: 14 U/L (ref 0–44)
ANION GAP: 5 (ref 5–15)
AST: 22 U/L (ref 15–41)
Alkaline Phosphatase: 57 U/L (ref 38–126)
BILIRUBIN TOTAL: 0.8 mg/dL (ref 0.3–1.2)
BUN: 43 mg/dL — ABNORMAL HIGH (ref 8–23)
CO2: 27 mmol/L (ref 22–32)
Calcium: 8.5 mg/dL — ABNORMAL LOW (ref 8.9–10.3)
Chloride: 105 mmol/L (ref 98–111)
Creatinine, Ser: 2.54 mg/dL — ABNORMAL HIGH (ref 0.61–1.24)
GFR calc Af Amer: 27 mL/min — ABNORMAL LOW (ref >60)
GFR calc non Af Amer: 23 mL/min — ABNORMAL LOW (ref >60)
Glucose, Bld: 93 mg/dL (ref 70–99)
POTASSIUM: 4.8 mmol/L (ref 3.5–5.1)
SODIUM: 137 mmol/L (ref 135–145)
TOTAL PROTEIN: 6.7 g/dL (ref 6.5–8.1)

## 2018-11-09 LAB — CBC
HCT: 38.3 % — ABNORMAL LOW (ref 39.0–52.0)
Hemoglobin: 11.9 g/dL — ABNORMAL LOW (ref 13.0–17.0)
MCH: 31.6 pg (ref 26.0–34.0)
MCHC: 31.1 g/dL (ref 30.0–36.0)
MCV: 101.9 fL — ABNORMAL HIGH (ref 80.0–100.0)
PLATELETS: 182 10*3/uL (ref 150–400)
RBC: 3.76 MIL/uL — ABNORMAL LOW (ref 4.22–5.81)
RDW: 11.9 % (ref 11.5–15.5)
WBC: 5.3 10*3/uL (ref 4.0–10.5)
nRBC: 0 % (ref 0.0–0.2)

## 2018-11-09 LAB — TSH: TSH: 1.33 u[IU]/mL (ref 0.350–4.500)

## 2018-11-09 LAB — T4, FREE: Free T4: 1.86 ng/dL — ABNORMAL HIGH (ref 0.82–1.77)

## 2018-11-09 MED ORDER — APIXABAN 2.5 MG PO TABS: 2.5000 mg | ORAL_TABLET | Freq: Two times a day (BID) | ORAL | 6 refills | Status: AC

## 2018-11-09 NOTE — Patient Instructions (Addendum)
STOP Carvedilol  DECREASE Eliquis to 2.5mg  (1 tab) twice a day  Your physician has requested that you have an echocardiogram. Echocardiography is a painless test that uses sound waves to create images of your heart. It provides your doctor with information about the size and shape of your heart and how well your heart's chambers and valves are working. This procedure takes approximately one hour. There are no restrictions for this procedure.  Your physician recommends that you schedule a follow-up appointment in: 6 months with an ECHO on day of MD appointment.  You will get a call to schedule this appointment.

## 2018-11-09 NOTE — Progress Notes (Signed)
ADVANCED HEART FAILURE CLINIC  Patient ID: [name], male   DOB: Mar 01, 1936, 83 y.o.   MRN: [id number]   ADVANCED HEART FAILURE NOTE PCP: Dr Willey Blade  HPI: [name] is a 83 y/o male with h/o cognitive impairment, renal insufficiency (baseline about 1.5-1.6), severe CHF due to NICM (EF 15-20%), atrial flutter (s/p DC-CV in April 2012) and LBBB. He and his family are not interested in ICD.   12/2010: Coronary angiograms showed insignifcant CAD. Right heart cath (on milrinone) showed a right atrial pressure of about 12, PA pressure was 45/79 with a mean of 27,pulmonary capillary wedge pressure was 12, LV pressure was 85/5 with an EDP of about 8.  Fick cardiac output was 4.4 with a cardiac index of 2.3.  Pulmonary artery saturations were 61 and 63%.  Pulmonary vascular resistance was 3.1 [place] units.   12/2010: TEE-guided cardioversion that showed an EF of about 15-20%.  There was no evidence of left atrial appendage thrombus.  He was successfully cardioverted with 1 shock.  He was started on amiodarone to help maintain sinus rhythm.Given his LBBB we discussed the possibility of BiV-ICD but decided to defer as he was doing so well.  He returns today with his wife and [facility] aide for HF follow up. Has been at [facility]. Unfortunately his brother has been diagnosed with lung CA and was unable to bring him today. Doing well. Denies SOB, edema, orthopnea or PND. Remains on lasix 40mg  every other daily. Recent labs with creatinine 2.6 which is stable.      02/2011: ECHO with EF 85-46%, Grade 1 diastolic dysfunction, ventricular septum dyssynergy.  Mildly reduced RV systolic function.   06/28/12 ECHO EF 25-30% 11/2015: Echo EF 35%.   Labs (9/16): K 4.5 Creatinine 1.8 TC 144 TG 104 HDL 45 LDL 78 Labs (10/17) K 4.8, Creatinine 2.31.   ROS: All systems negative except as listed in HPI, PMH and Problem List.  Past Medical History:  Diagnosis Date  . Arthritis   . Atrial fibrillation  ([place])   . Atrial flutter ([place])   . Cardiomyopathy    Nonischemic, LVEF 20%  . CHF (congestive heart failure) ([place])   . Chronic renal insufficiency   . COPD (chronic obstructive pulmonary disease) ([place])   . Fracture of unspecified part of left clavicle, initial encounter for closed fracture   . Hypothyroidism   . Left ankle sprain   . Left bundle branch block   . Mixed hyperlipidemia   . Renal failure   . Scoliosis     Current Outpatient Medications  Medication Sig Dispense Refill  . amiodarone (PACERONE) 200 MG tablet TAKE 1 TABLET BY MOUTH ONCE DAILY 30 tablet 11  . ammonium lactate (AMLACTIN) 12 % cream Apply topically as needed for dry skin.    [name] aspirin EC 81 MG tablet Take 81 mg by mouth daily.    . betamethasone dipropionate (DIPROLENE) 0.05 % cream Apply topically 2 (two) times daily.    . carvedilol (COREG) 3.125 MG tablet TAKE 1 TABLET BY MOUTH TWICE DAILY WITH FOOD 60 tablet 6  . ELIQUIS 5 MG TABS tablet TAKE 1 TABLET BY MOUTH TWICE DAILY 60 tablet 3  . furosemide (LASIX) 40 MG tablet Take 1 tablet (40 mg total) by mouth every other day.    . hydrALAZINE (APRESOLINE) 25 MG tablet TAKE 1 TABLET BY MOUTH THREE TIMES DAILY 90 tablet 11  . hydrocortisone valerate cream (WESTCORT) 0.2 % Apply 1 application topically 2 (two) times  daily.    . isosorbide mononitrate (IMDUR) 30 MG 24 hr tablet TAKE 1 TABLET BY MOUTH ONCE DAILY IN THE MORNING 30 tablet 6  . ketoconazole (NIZORAL) 2 % cream Apply 1 application topically daily.    [name] ketoconazole (NIZORAL) 2 % shampoo Apply 1 application topically 2 (two) times a week.    . levothyroxine (SYNTHROID, LEVOTHROID) 175 MCG tablet Take 175 mcg by mouth daily before breakfast.     . loratadine (CLARITIN) 10 MG tablet Take 10 mg by mouth daily.    [name] losartan (COZAAR) 50 MG tablet TAKE 1 TABLET BY MOUTH ONCE DAILY 90 tablet 0  . spironolactone (ALDACTONE) 25 MG tablet TAKE ONE TABLET BY MOUTH ONCE DAILY 90 tablet 3   No current  facility-administered medications for this encounter.      PHYSICAL EXAM: Vitals:   11/09/18 1035  BP: (!) 118/56  Pulse: (!) 51  SpO2: 92%  Weight: 73 kg (161 lb)   General: Well appearing male, severe kypho-scoliosis HEENT: normal Neck: supple. no JVD. Carotids 2+ bilat; no bruits. No lymphadenopathy or thryomegaly appreciated. Cor: PMI nondisplaced. Brady regularNo rubs, gallops or murmurs. Lungs: clear Abdomen: soft, nontender, nondistended. No hepatosplenomegaly. No bruits or masses. Good bowel sounds. Extremities: no cyanosis, clubbing, rash, trace edema Neuro: alert & orientedx3, cranial nerves grossly intact. moves all 4 extremities w/o difficulty. Affect pleasant   SB 49 with 1AVB + LBBB Personally reviewed   ASSESSMENT & PLAN:  1. Chronic systolic HF ECHO 5465  EF%. 25-30% Offered ICD in the past but he declined.  Echo 3/17 EF 35% - NYHA II.  - Overall volume status looks good. LE edema improved. Of note, has failed lasix titration in past due to AKI. Continue current regimen. Continue to use compression stockings - Stop carvedilol with bradycardia - Continue hydralazine 25 mg tid/Imdur 30 mg daily  - Continue losartan 50 mg daily. (has not wanted to switch to [facility] due to cost). Need to watch renal function closely. May have to stop in near future.  - Continue Arlyce Harman.  - Continue compression hose - Repeat echo at next visit  2. AFL  - maintaining SR on amio 200 daily. On apixaban. No bleeding  - With age >= 80 and Cr > 1.5 decrease apixaban to 2.5 bid  - check thyroid and CMET 3. CKD, stage 3  - recent creatinine stable at 2.6 4. LBBB - stable 5. Bradycardia - has significant underlying conduction disease. Stop carvedilol  - may need to consider PPM (CRT) at some point but does not meet criteria yet  Glori Bickers, MD 10:52 AM

## 2018-11-10 LAB — T3, FREE: T3, Free: 1.4 pg/mL — ABNORMAL LOW (ref 2.0–4.4)

## 2018-11-25 DIAGNOSIS — D044 Carcinoma in situ of skin of scalp and neck: Secondary | ICD-10-CM | POA: Diagnosis not present

## 2018-12-29 ENCOUNTER — Encounter: Payer: Self-pay | Admitting: Internal Medicine

## 2018-12-29 ENCOUNTER — Other Ambulatory Visit: Payer: Self-pay

## 2018-12-29 ENCOUNTER — Non-Acute Institutional Stay: Payer: Medicare HMO | Admitting: Internal Medicine

## 2018-12-29 VITALS — BP 100/60 | HR 74 | Temp 97.7°F | Ht 64.0 in | Wt 161.0 lb

## 2018-12-29 DIAGNOSIS — I952 Hypotension due to drugs: Secondary | ICD-10-CM

## 2018-12-29 DIAGNOSIS — N184 Chronic kidney disease, stage 4 (severe): Secondary | ICD-10-CM

## 2018-12-29 DIAGNOSIS — R42 Dizziness and giddiness: Secondary | ICD-10-CM | POA: Diagnosis not present

## 2018-12-29 DIAGNOSIS — I5022 Chronic systolic (congestive) heart failure: Secondary | ICD-10-CM | POA: Diagnosis not present

## 2018-12-29 DIAGNOSIS — I4892 Unspecified atrial flutter: Secondary | ICD-10-CM | POA: Diagnosis not present

## 2018-12-29 DIAGNOSIS — E039 Hypothyroidism, unspecified: Secondary | ICD-10-CM

## 2018-12-29 DIAGNOSIS — R001 Bradycardia, unspecified: Secondary | ICD-10-CM

## 2018-12-29 MED ORDER — LOSARTAN POTASSIUM 25 MG PO TABS: ORAL_TABLET | ORAL | 5 refills | Status: DC

## 2018-12-29 NOTE — Progress Notes (Signed)
Location:  [facility]up[facility] psychologist of Service:  Clinic (12)  Provider: Carolyn Sylvia L. Mariea Clonts, D.O., C.M.D.  Code Status: DNR; need to do MOST form with his brother who is HCPOA  Goals of Care:  Advanced Directives 09/22/2018  Does Patient Have a Medical Advance Directive? Yes  Type of Advance Directive Out of facility DNR (pink MOST or yellow form);Healthcare Power of Attorney  Does patient want to make changes to medical advance directive? No - Patient declined  Copy of [place] in Chart? Yes - validated most recent copy scanned in chart (See row information)  Would patient like information on creating a medical advance directive? -  Pre-existing out of facility DNR order (yellow form or pink MOST form) Yellow form placed in chart (order not valid for inpatient use)     Chief Complaint  Patient presents with  . Medical Management of Chronic Issues    28mth follow-up    HPI: Patient is a 83 y.o. male seen today for medical management of chronic diseases.    BP continues to run low.  He's had dizziness when he goes to use the bathroom and struggles to get back up.  No chest pain.  No shortness of breath that he reports, but he does look a bit dyspneic when they arrive at clinic after walking from [place].  Right knee has a little bony spot that hurts sometimes under it.    He is still healing from his mohs surgery on his scalp.  Staff are transmitting photos to derm to keep track of the healing process.  He can't wait to not have to have a dressing on it.    He also has sutures that are self-dissolving in his left neck where he also had derm surgery.  He had his thyroid checked not long ago due to his amiodarone and being on levothyroxine and tsh was normal (he asked about this).    He continues with nizoral shampoo and cream for his skin and hydrocortisone and ammonium lactate, as well--all derm prescriptions.    He has rhinitis despite his claritin.      Past Medical History:  Diagnosis Date  . Arthritis   . Atrial fibrillation ([place])   . Atrial flutter ([place])   . Cardiomyopathy    Nonischemic, LVEF 20%  . CHF (congestive heart failure) ([place])   . Chronic renal insufficiency   . COPD (chronic obstructive pulmonary disease) ([place])   . Fracture of unspecified part of left clavicle, initial encounter for closed fracture   . Hypothyroidism   . Left ankle sprain   . Left bundle branch block   . Mixed hyperlipidemia   . Renal failure   . Scoliosis     Past Surgical History:  Procedure Laterality Date  . EYE SURGERY    . LAPAROSCOPIC CHOLECYSTECTOMY  2008  . left foot surgery  1998  . MASS EXCISION Left 06/26/2016   Procedure: EXCISION Mucoid tumor;  Surgeon: Daryll Brod, MD;  Location: [facility];  Service: Orthopedics;  Laterality: Left;  FAB  . Right inguinal herniorrhaphy    . ROTATION FLAP Left 06/26/2016   Procedure: debridement of distal interphalangeal possible, ROTATION FLAP left index finger;  Surgeon: Daryll Brod, MD;  Location: [facility];  Service: Orthopedics;  Laterality: Left;  . SKIN CANCER REMOVED  LEFT EAR    . TONSILLECTOMY      No Known Allergies  Outpatient Encounter Medications as of 12/29/2018  Medication Sig  .  acetaminophen (TYLENOL) 325 MG tablet Take 650 mg by mouth every 6 (six) hours as needed.  [name] amiodarone (PACERONE) 200 MG tablet TAKE 1 TABLET BY MOUTH ONCE DAILY  . ammonium lactate (AMLACTIN) 12 % cream Apply topically as needed for dry skin.  [name] apixaban (ELIQUIS) 2.5 MG TABS tablet Take 1 tablet (2.5 mg total) by mouth 2 (two) times daily. Please cancel all previous orders for current medication. Change in dosage or pill size.  [name] aspirin EC 81 MG tablet Take 81 mg by mouth daily.  . furosemide (LASIX) 40 MG tablet Take 1 tablet (40 mg total) by mouth every other day.  . hydrALAZINE (APRESOLINE) 25 MG tablet TAKE 1 TABLET BY MOUTH THREE TIMES DAILY  . hydrocortisone valerate cream (WESTCORT) 0.2 % Apply 1  application topically 2 (two) times daily.  . isosorbide mononitrate (IMDUR) 30 MG 24 hr tablet TAKE 1 TABLET BY MOUTH ONCE DAILY IN THE MORNING  . ketoconazole (NIZORAL) 2 % cream Apply 1 application topically daily.  [name] ketoconazole (NIZORAL) 2 % shampoo Apply 1 application topically 2 (two) times a week.  . levothyroxine (SYNTHROID, LEVOTHROID) 175 MCG tablet Take 175 mcg by mouth daily before breakfast.   . loratadine (CLARITIN) 10 MG tablet Take 10 mg by mouth daily.  [name] losartan (COZAAR) 50 MG tablet TAKE 1 TABLET BY MOUTH ONCE DAILY  . spironolactone (ALDACTONE) 25 MG tablet TAKE ONE TABLET BY MOUTH ONCE DAILY  . [DISCONTINUED] betamethasone dipropionate (DIPROLENE) 0.05 % cream Apply topically 2 (two) times daily.   No facility-administered encounter medications on file as of 12/29/2018.     Review of Systems:  Review of Systems  Constitutional: Negative for chills, fever and malaise/fatigue.  HENT: Positive for hearing loss. Negative for congestion.        Rhinitis  Eyes: Positive for double vision.       Glasses, chronic visual difficulty  Respiratory: Negative for cough and shortness of breath.   Cardiovascular: Negative for chest pain, palpitations and leg swelling.  Gastrointestinal: Negative for blood in stool, constipation and melena.  Genitourinary: Negative for dysuria.  Musculoskeletal: Positive for joint pain. Negative for falls.       Small bump below right knee hurts if touched; kyphosis  Skin:       telfa on scalp where mohs surgery was; sutures slowly dissolving in left neck from other surgery  Neurological: Negative for dizziness and loss of consciousness.  Endo/Heme/Allergies: Bruises/bleeds easily.  Psychiatric/Behavioral: Negative for depression and memory loss. The patient is not nervous/anxious and does not have insomnia.     Health Maintenance  Topic Date Due  . INFLUENZA VACCINE  04/16/2019  . TETANUS/TDAP  03/06/2028  . PNA vac Low Risk Adult   Completed    Physical Exam: Vitals:   12/29/18 1404  BP: 100/60  Pulse: 74  Temp: 97.7 F (36.5 C)  TempSrc: Oral  SpO2: 96%  Weight: 161 lb (73 kg)  Height: 5\' 4"  (1.626 m)   Body mass index is 27.64 kg/m. Physical Exam Vitals signs and nursing note reviewed.  Constitutional:      General: He is not in acute distress.    Appearance: He is normal weight. He is not toxic-appearing.  HENT:     Head: Normocephalic and atraumatic.     Right Ear: There is no impacted cerumen.     Left Ear: There is no impacted cerumen.  Cardiovascular:     Rate and Rhythm: Rhythm irregular.  Pulmonary:  Breath sounds: No rales.     Comments: Slight dyspnea on exertion upon arrival to clinic (long walk from [place]) Abdominal:     General: Bowel sounds are normal.  Musculoskeletal:     Comments: Kyphosis, uses walker to ambulate  Skin:    General: Skin is warm and dry.     Capillary Refill: Capillary refill takes less than 2 seconds.     Comments: telfa over scalp wound from mohs surgery; sutures in place left base of neck  Neurological:     General: No focal deficit present.     Mental Status: He is alert and oriented to person, place, and time.  Psychiatric:        Mood and Affect: Mood normal.     Labs reviewed: Basic Metabolic Panel: Recent Labs    02/25/18 06/12/18 09/22/18 11/09/18 1108  NA 141  --  137 137  K 4.9  --  4.8 4.8  CL  --   --   --  105  CO2  --   --   --  27  GLUCOSE  --   --   --  93  BUN 40*  --  44* 43*  CREATININE 2.4*  --  2.6* 2.54*  CALCIUM  --   --   --  8.5*  TSH 0.20* 0.58  --  1.330   Liver Function Tests: Recent Labs    02/25/18 0700 11/09/18 1108  AST 21 22  ALT 15 14  ALKPHOS 74 57  BILITOT  --  0.8  PROT  --  6.7  ALBUMIN  --  3.3*   No results for input(s): LIPASE, AMYLASE in the last 8760 hours. No results for input(s): AMMONIA in the last 8760 hours. CBC: Recent Labs    02/25/18 09/24/18 11/09/18 1108  WBC 5.8 10.5 5.3  HGB  12.2* 12.7* 11.9*  HCT 36* 37* 38.3*  MCV  --   --  101.9*  PLT 174 203 182   Lipid Panel: Recent Labs    02/25/18  CHOL 173  HDL 55  LDLCALC 95  TRIG 117   Lab Results  Component Value Date   HGBA1C 5.5 10/18/2017   Assessment/Plan 1. Chronic systolic heart failure (HCC) -severe, but family is not wanting to pursue ICD for him per cardiology notes -due to dizziness and hypotension even after resolution of bradycardia, will reduce losartan dose - losartan (COZAAR) 25 MG tablet; TAKE 1 TABLET BY MOUTH ONCE DAILY  Dispense: 30 tablet; Refill: 5 -cont aldactone daily and lasix qod, no signs of volume overload -if bps remain low, might reduce hydralazine also or decrease it and put losartan back since it has other cardiac benefits -staff to check bp twice weekly and provide results to me   2 Bradycardia with 31-40 beats per minute -resolved with  Coreg being stopped  3. Acquired hypothyroidism -cont current levothyroxine, check tsh twice a year with amiodarone  4. Hypotension due to drugs -will reduce losartan dose to 25mg  from 50mg  due to many low bps, only one in the 150s, majority as low as 90s to 110s and he's getting dizzy then and at risk for falling -cont diuretics for chf/cardiomyopathy and I was hesitant to touch his imdur for fear he'll develop chest pains -he's also on hydralazine 25mg  po tid and could consider reducing it instead; don't want to stop ARB with his cardiomyopathy and ckd  5. CKD (chronic kidney disease) stage 4, GFR 15-29 ml/min (HCC) -Avoid nephrotoxic  agents like nsaids, dose adjust renally excreted meds, hydrate. -if he gets dry with infection, hold lasix and/or aldactone and arb  -Dr. Haroldine Laws appropriately reduced his eliquis due to this and his age  65. Atrial flutter, unspecified type ([place]) -rate controlled but no longer bradycardic (74 today) -cont amiodarone  8. Dizziness -suspect due to low bps--review of readings indicates he is running  in upper 90s and lower 400N systolic at times  -will reduce losartan dose to address this  Labs/tests ordered:  @ORDERS @ Next appt:  Visit date not found  Danzel Marszalek L. Jonaya Freshour, D.O. [facility] [address], [place] [address] Cell Phone (Mon-Fri 8am-5pm):  [phone] On Call:  [phone] & follow prompts after 5pm & weekends Office Phone:  [phone] Office Fax:  [phone]

## 2019-01-09 ENCOUNTER — Encounter: Payer: Self-pay | Admitting: Internal Medicine

## 2019-02-23 DIAGNOSIS — Z20828 Contact with and (suspected) exposure to other viral communicable diseases: Secondary | ICD-10-CM | POA: Diagnosis not present

## 2019-03-14 IMAGING — CT CT HEAD W/O CM
3 series · 16 of 47 positions shown, 19 images · non-contrast
Comparison: CT head without contrast 04/24/2017.

CLINICAL DATA: Fall getting to of the hand. Laceration to
left-sided head. No loss of consciousness. The patient is on
anticoagulation.

EXAM:
CT HEAD WITHOUT CONTRAST
TECHNIQUE: Contiguous axial images were obtained from the base of the skull
through the vertex without intravenous contrast.

[Series 2: head trauma wo · axial · 0.39mm/px · z∈[+1512,+1636]mm · 10 of 30 slices shown, 13 images]
[im 3/30  brain]
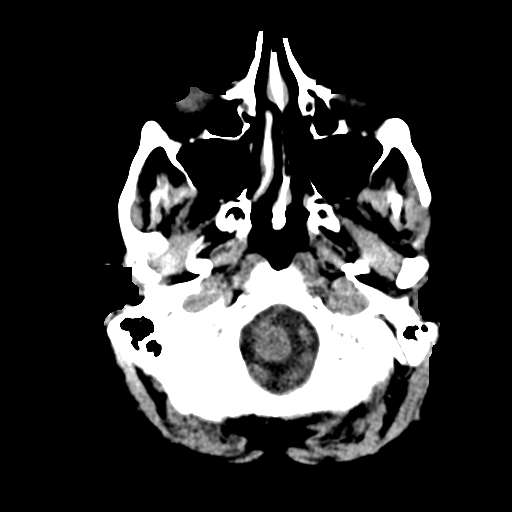
[im 3/30  bone]
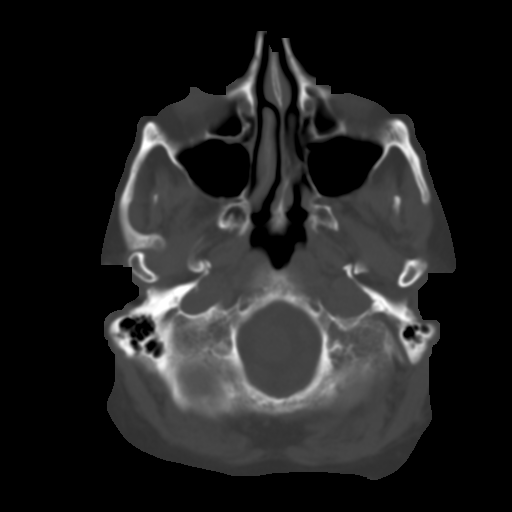
[im 6/30  brain]
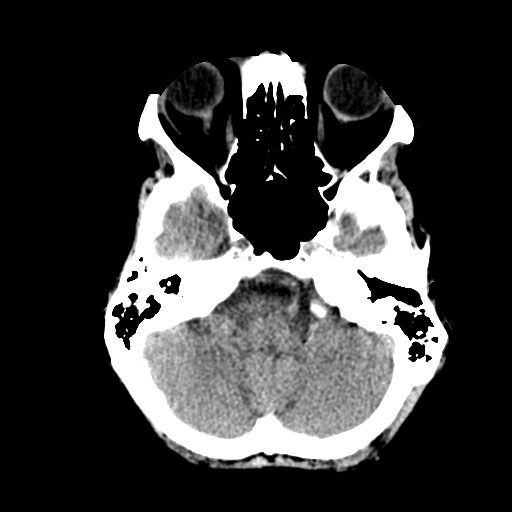
[im 9/30  brain]
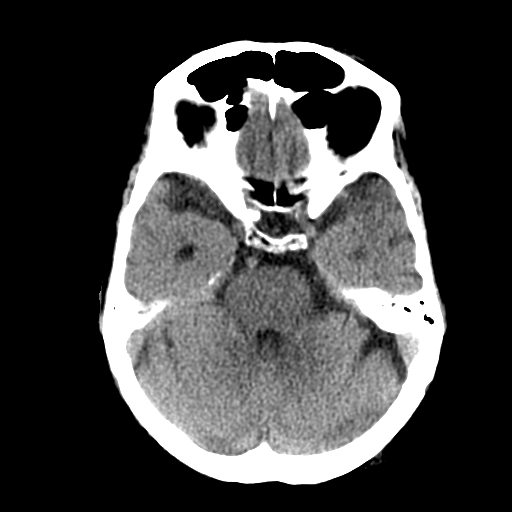
[im 11/30  brain]
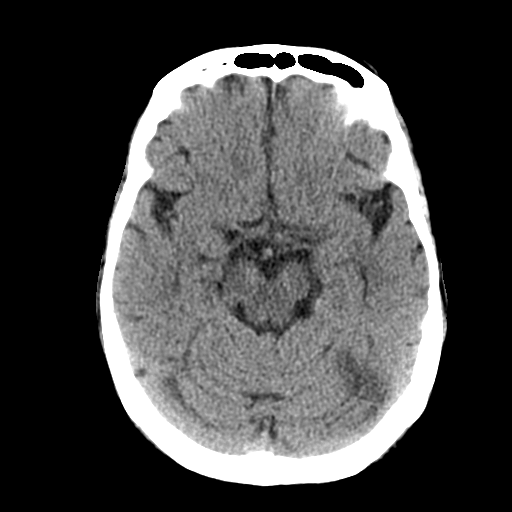
[im 14/30  brain]
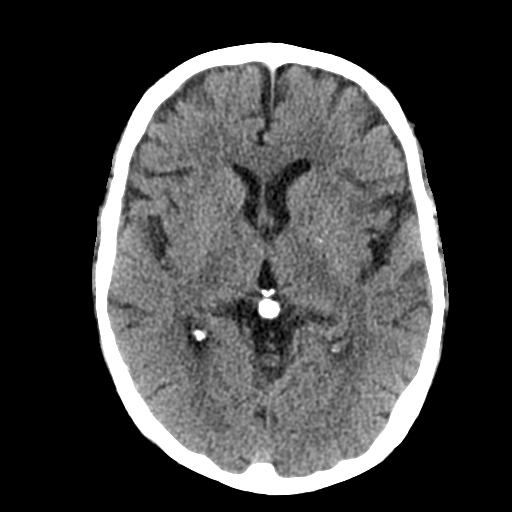
[im 14/30  bone]
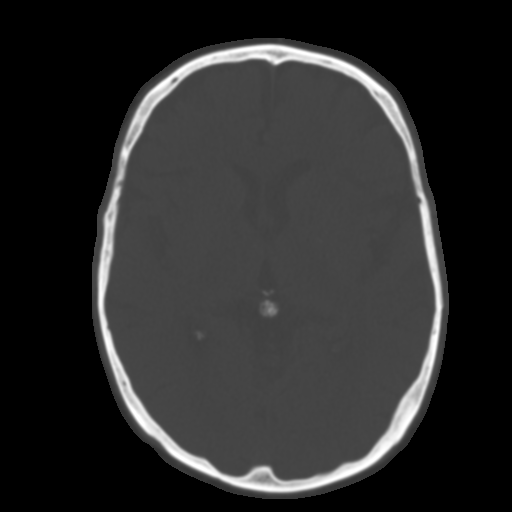
[im 17/30  brain]
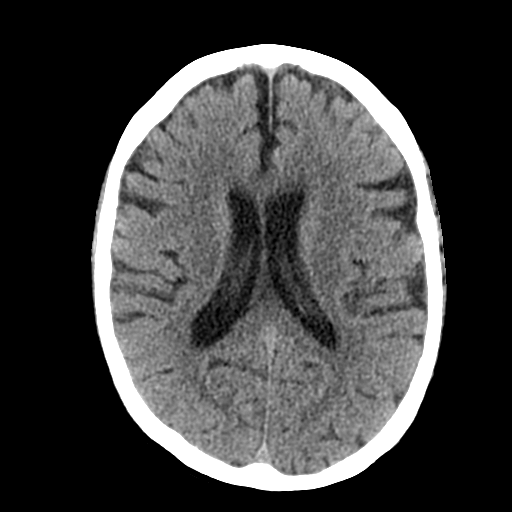
[im 20/30  brain]
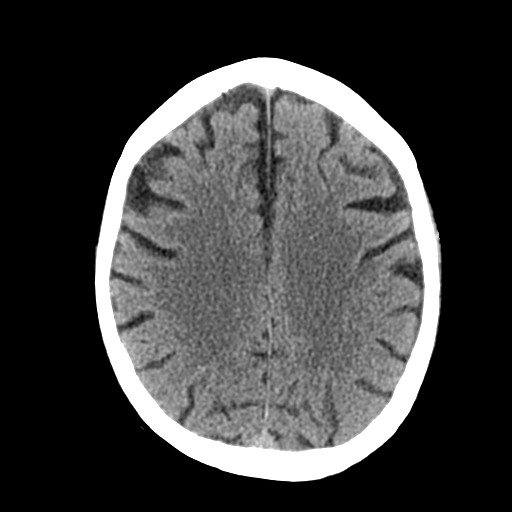
[im 23/30  brain]
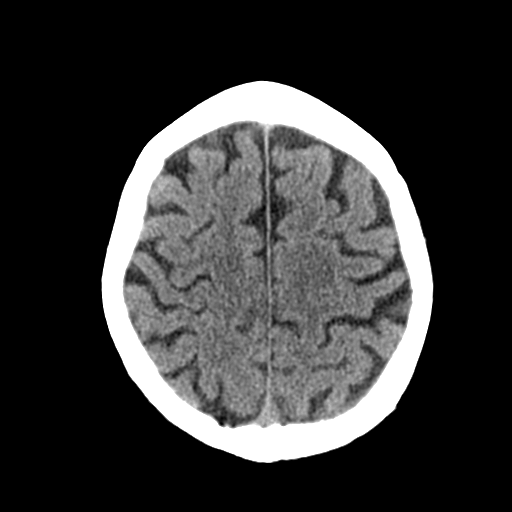
[im 25/30  brain]
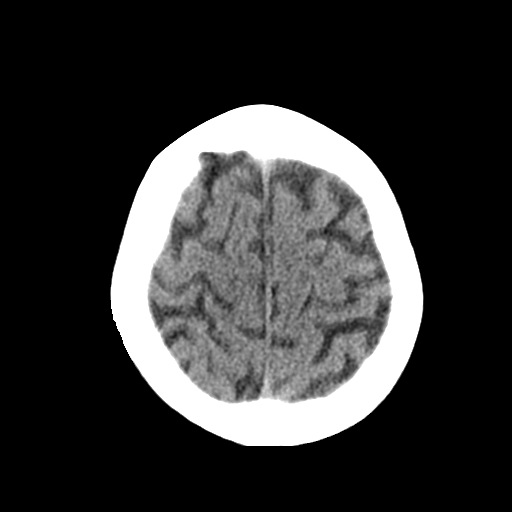
[im 25/30  bone]
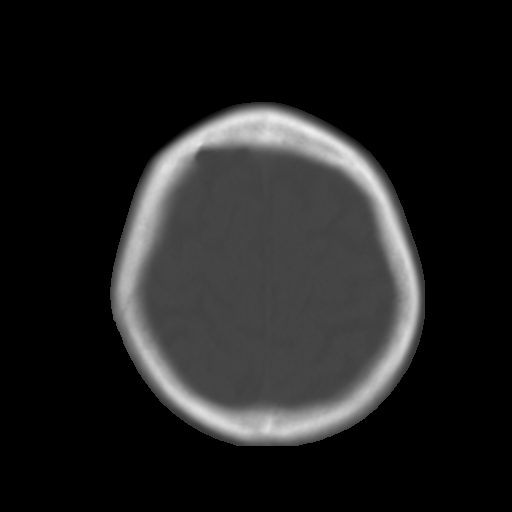
[im 28/30  brain]
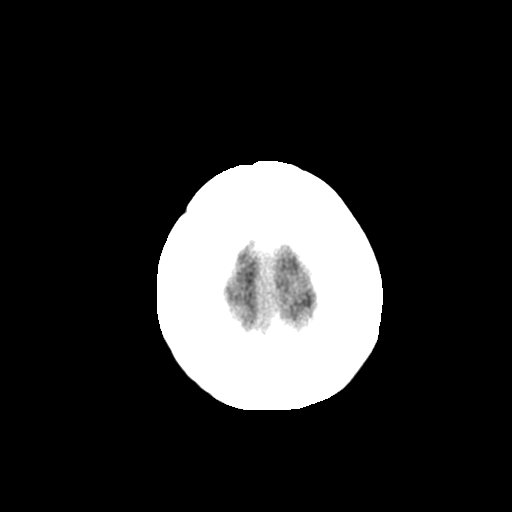

[Series 4: coronal soft tissue · coronal · 0.31mm/px · 3 of 63 slices shown]
[im 21/63  brain]
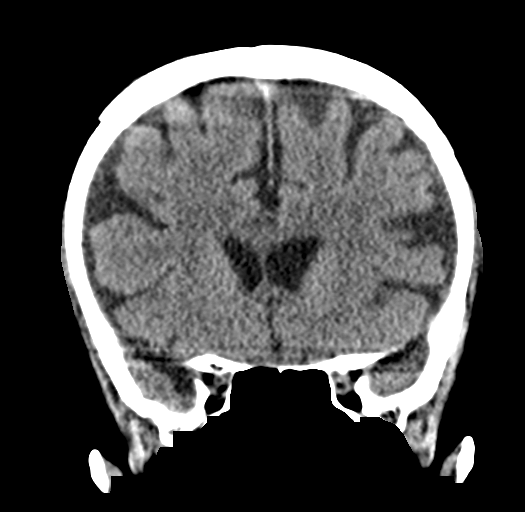
[im 28/63  brain]
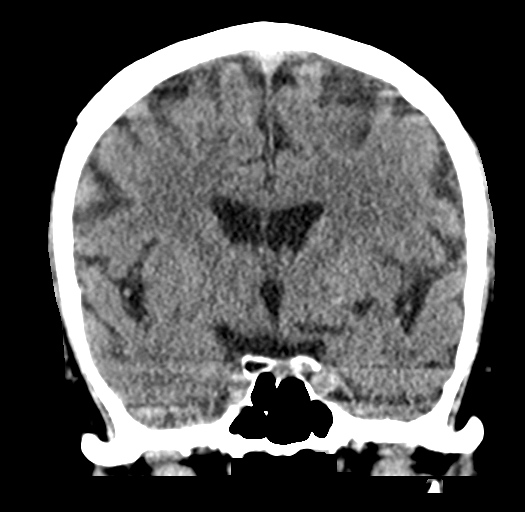
[im 35/63  brain]
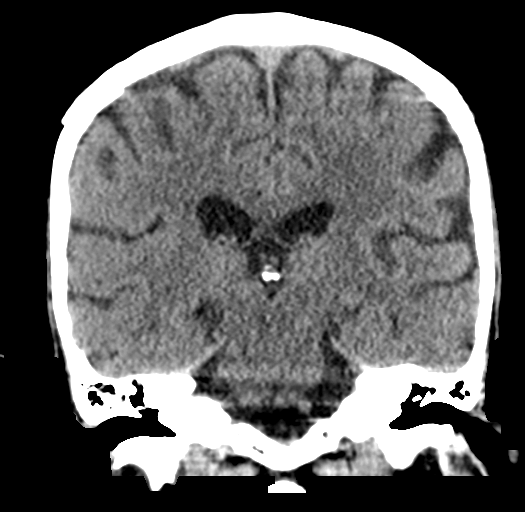

[Series 5: sagittal soft tissue · sagittal · 0.34mm/px · 3 of 50 slices shown]
[im 17/50  brain]
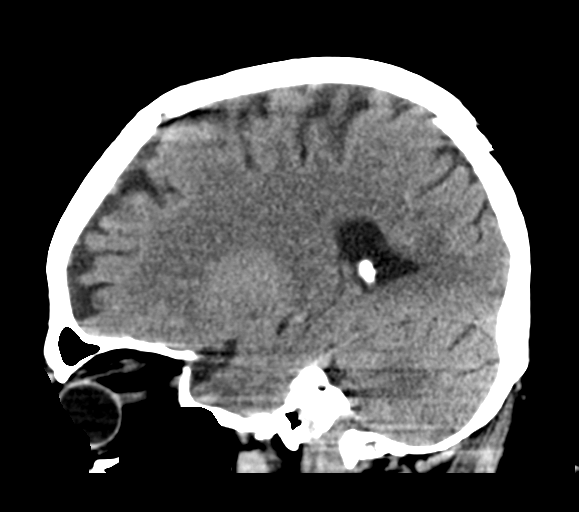
[im 25/50  brain]
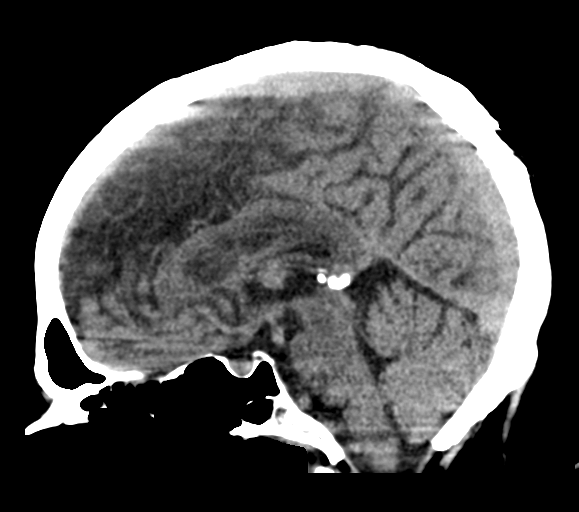
[im 33/50  brain]
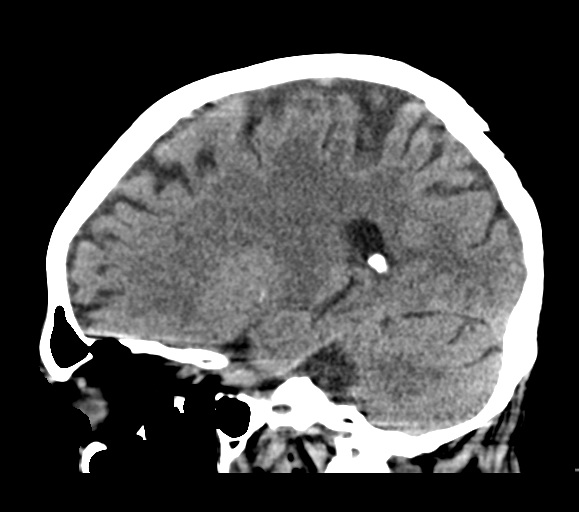

[16 of 47 positions shown; findings below may reference images not displayed]

FINDINGS: Brain: Mild atrophy and white matter changes are similar the prior
exam. No acute infarct, hemorrhage, or mass lesion is present.
Ventricles are stable in size. The brainstem and cerebellum are
normal.

Vascular: Atherosclerotic calcifications are present within the
cavernous internal carotid artery is bilaterally. There is no
hyperdense vessel.

Skull: Calvarium is intact. No acute or healing fractures are
present. The no significant focal soft tissue injury is present.

Sinuses/Orbits: The paranasal sinuses and mastoid air cells are
clear. Globes and orbits are within normal limits bilaterally.
IMPRESSION: 1. No acute intracranial abnormality or significant change.
2. Stable mild atrophy and white matter disease.
3. Atherosclerosis.

## 2019-03-14 IMAGING — DX DG ANKLE COMPLETE 3+V*L*
3 series · 3 of 3 positions shown · non-contrast
Comparison: None.

CLINICAL DATA: Fell trying to get into expand or.  Left ankle pain.

EXAM:
LEFT ANKLE COMPLETE - 3+ VIEW

[ankle ap]
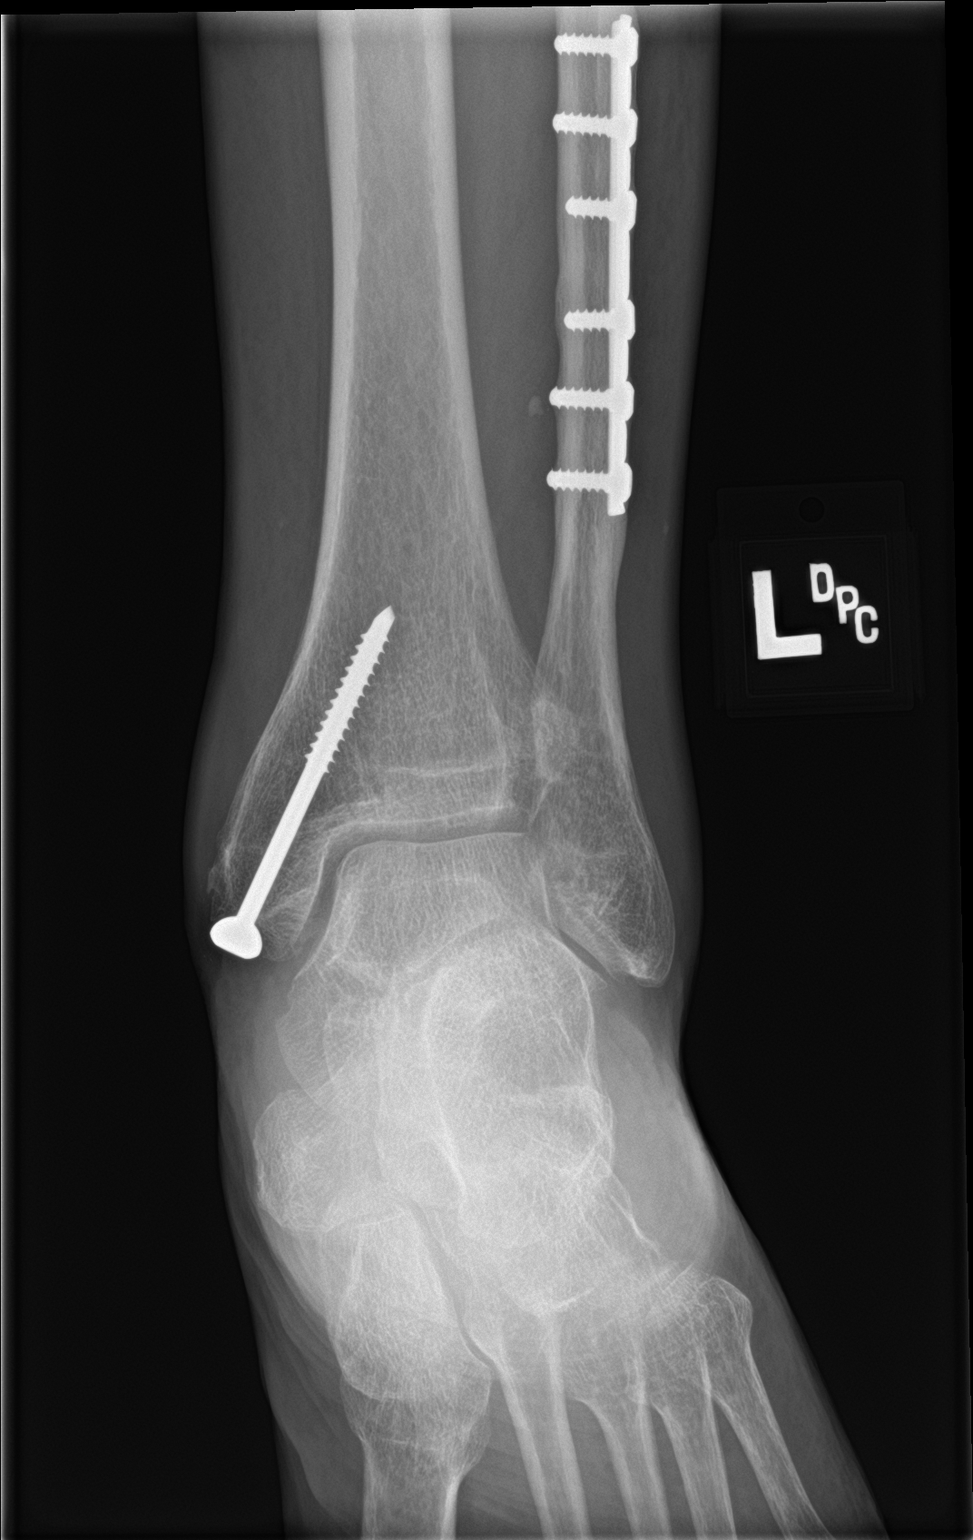

[ankle obl]
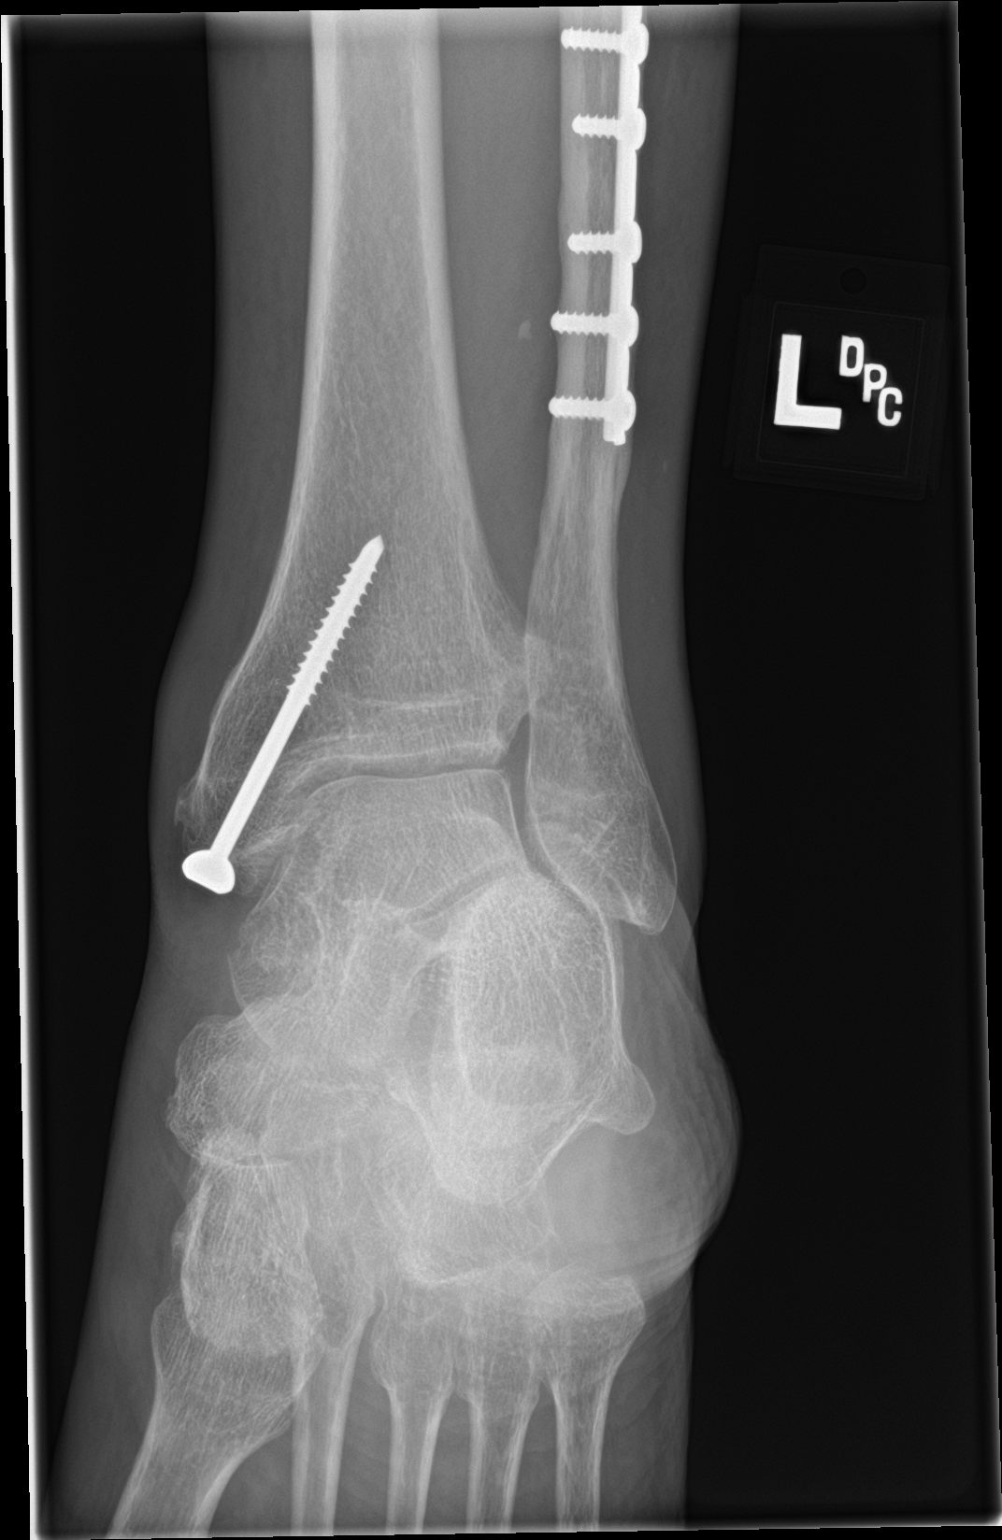

[ankle lat]
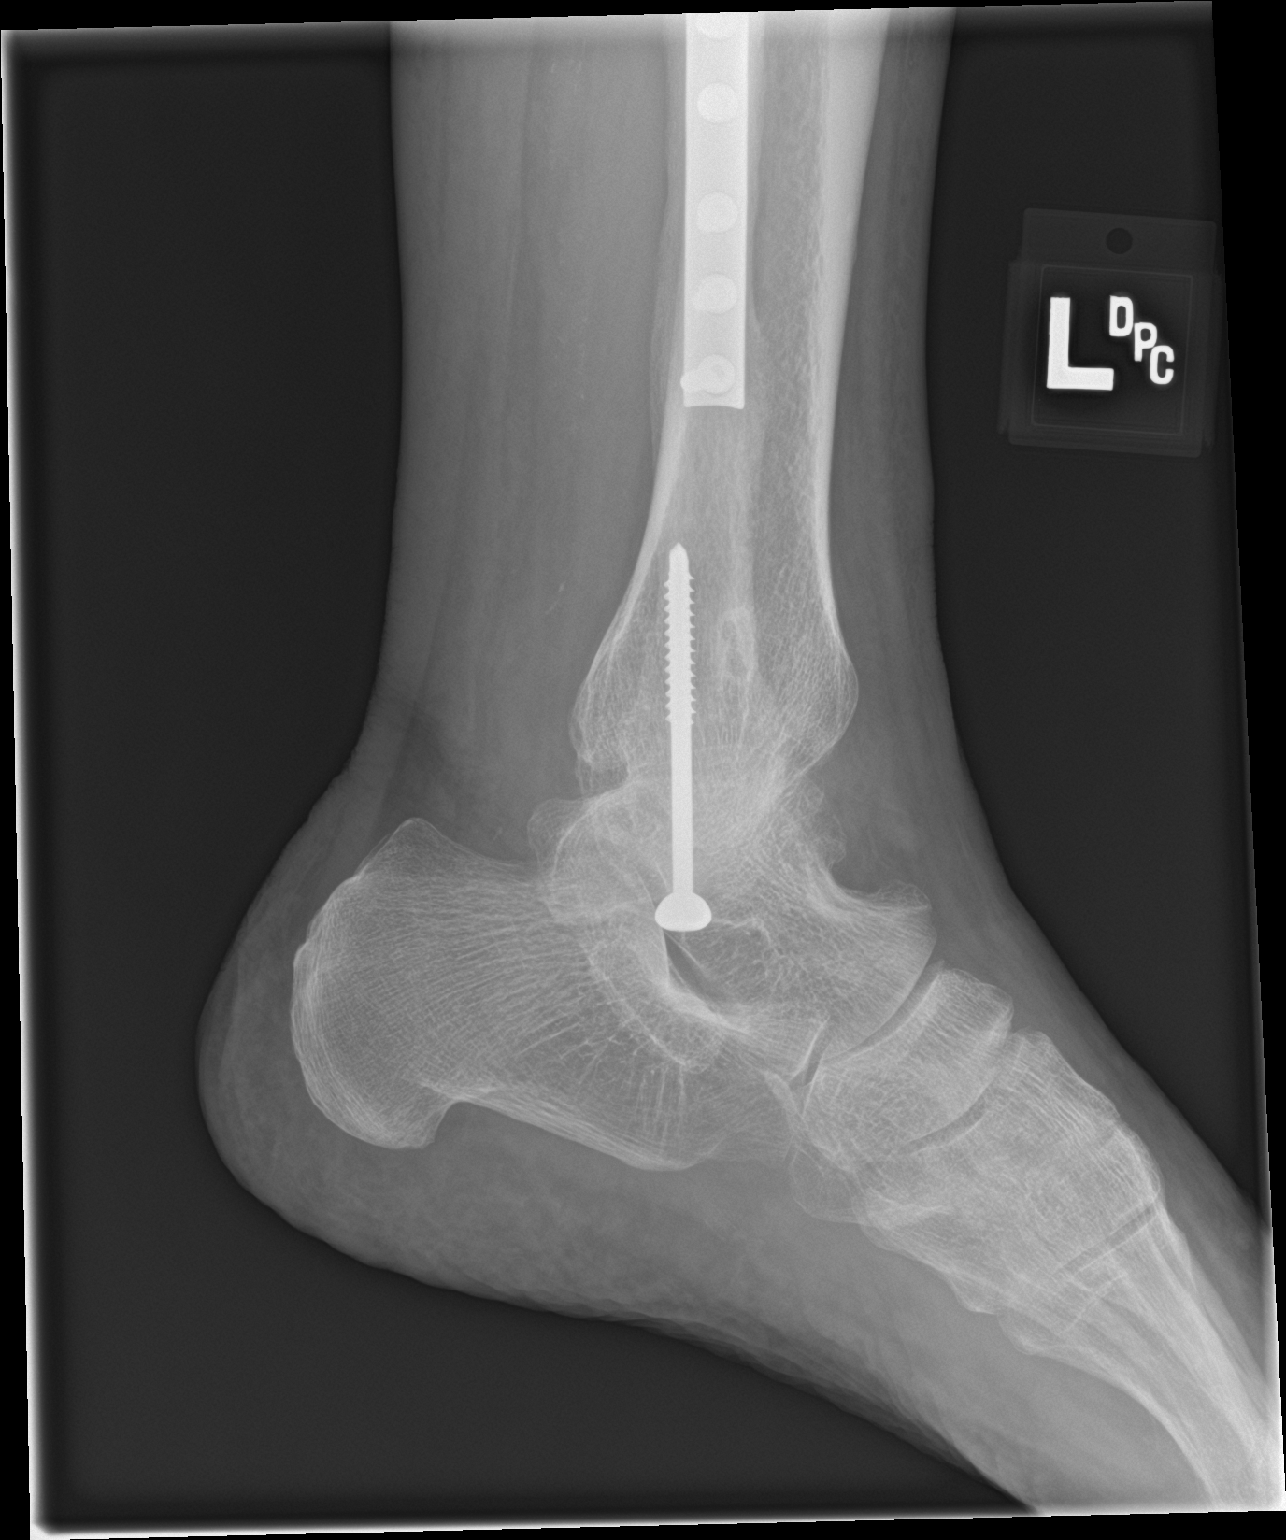

[3 of 3 positions shown; findings below may reference images not displayed]

FINDINGS: Remote plate screw fixation is present in the distal fibula. Medial
malleolus screws are placed.

Neck located. Ankle effusion is present. There is no acute fracture.
IMPRESSION: 1. Ankle effusion without fracture. Ligamentous injury is not
excluded.
2. Remote ORIF.

## 2019-03-14 IMAGING — DX DG SHOULDER 2+V*L*
2 series · 2 of 2 positions shown · non-contrast
Comparison: Two-view chest x-ray 04/24/2017.

CLINICAL DATA: Fall trying to get into the [place]. Landed on his
left side.

EXAM:
LEFT SHOULDER - 2+ VIEW

[shoulder grashey]
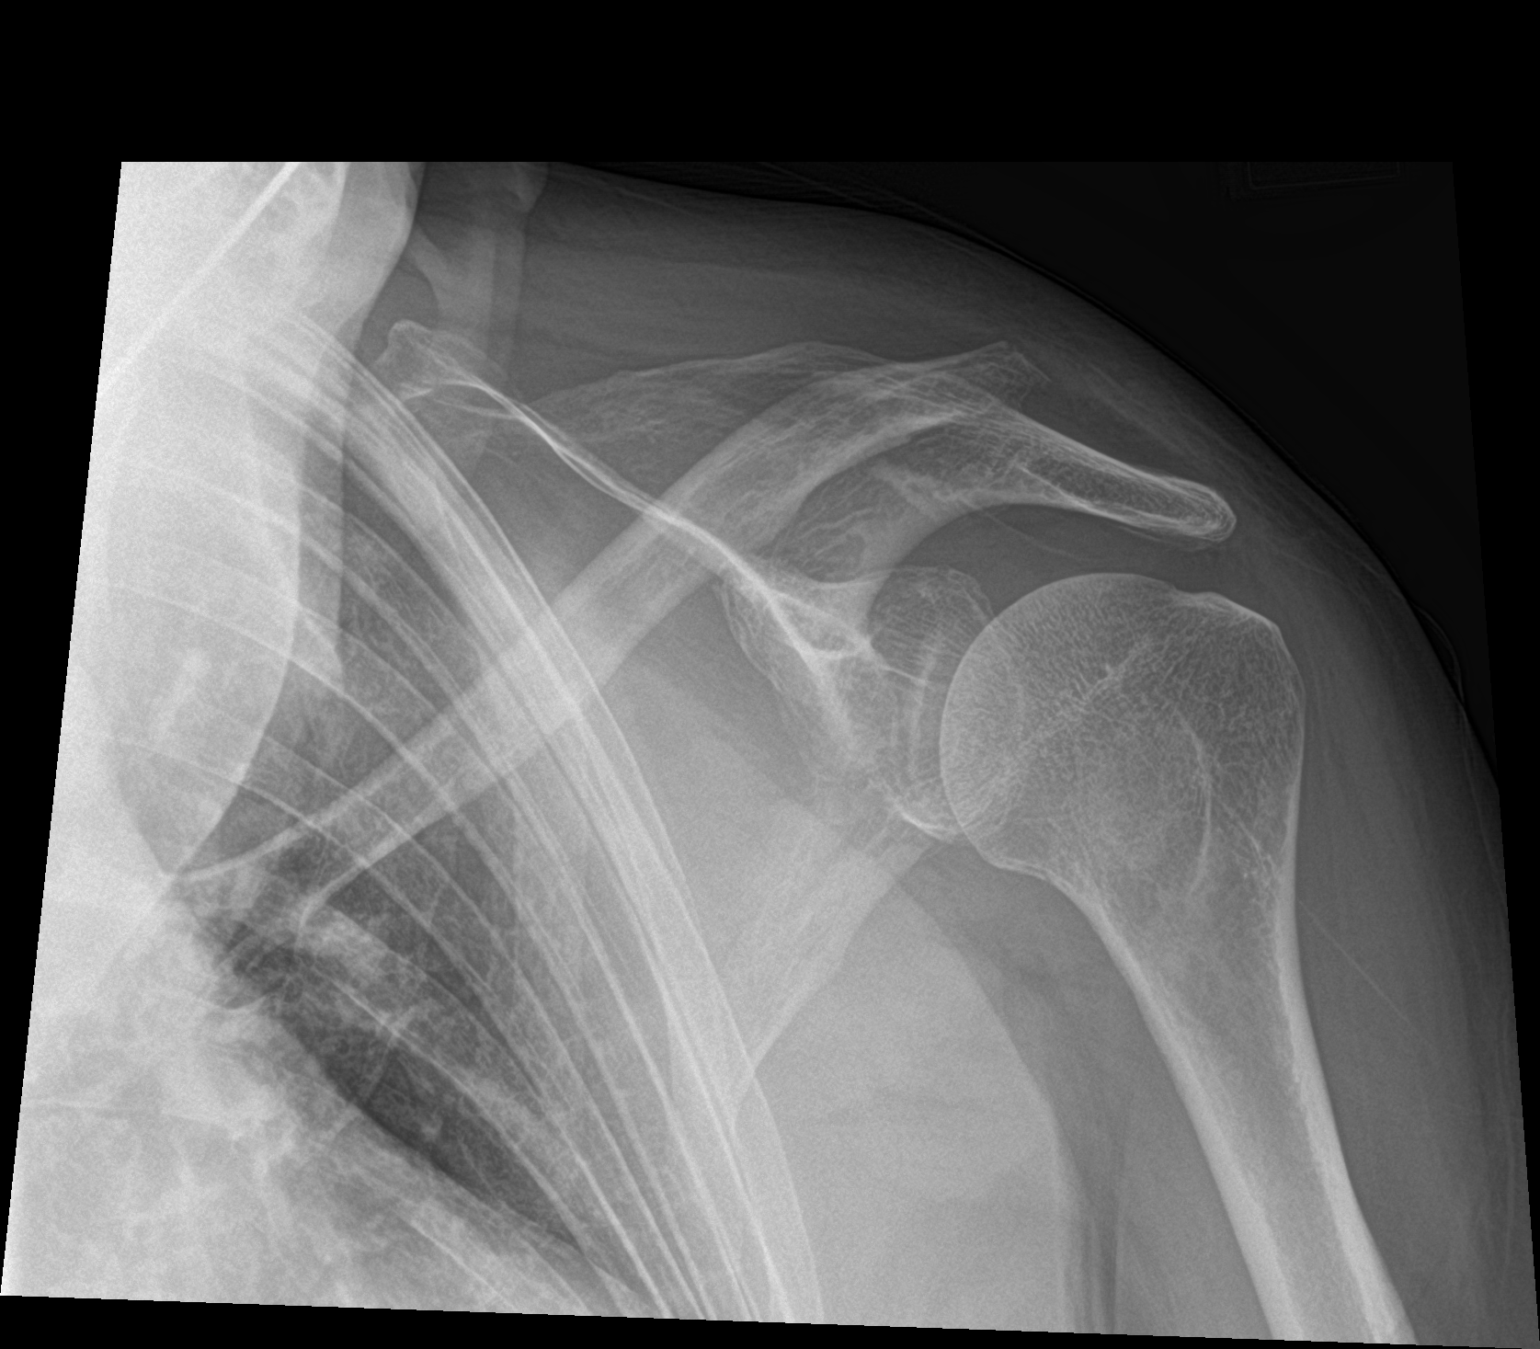

[shoulder y view]
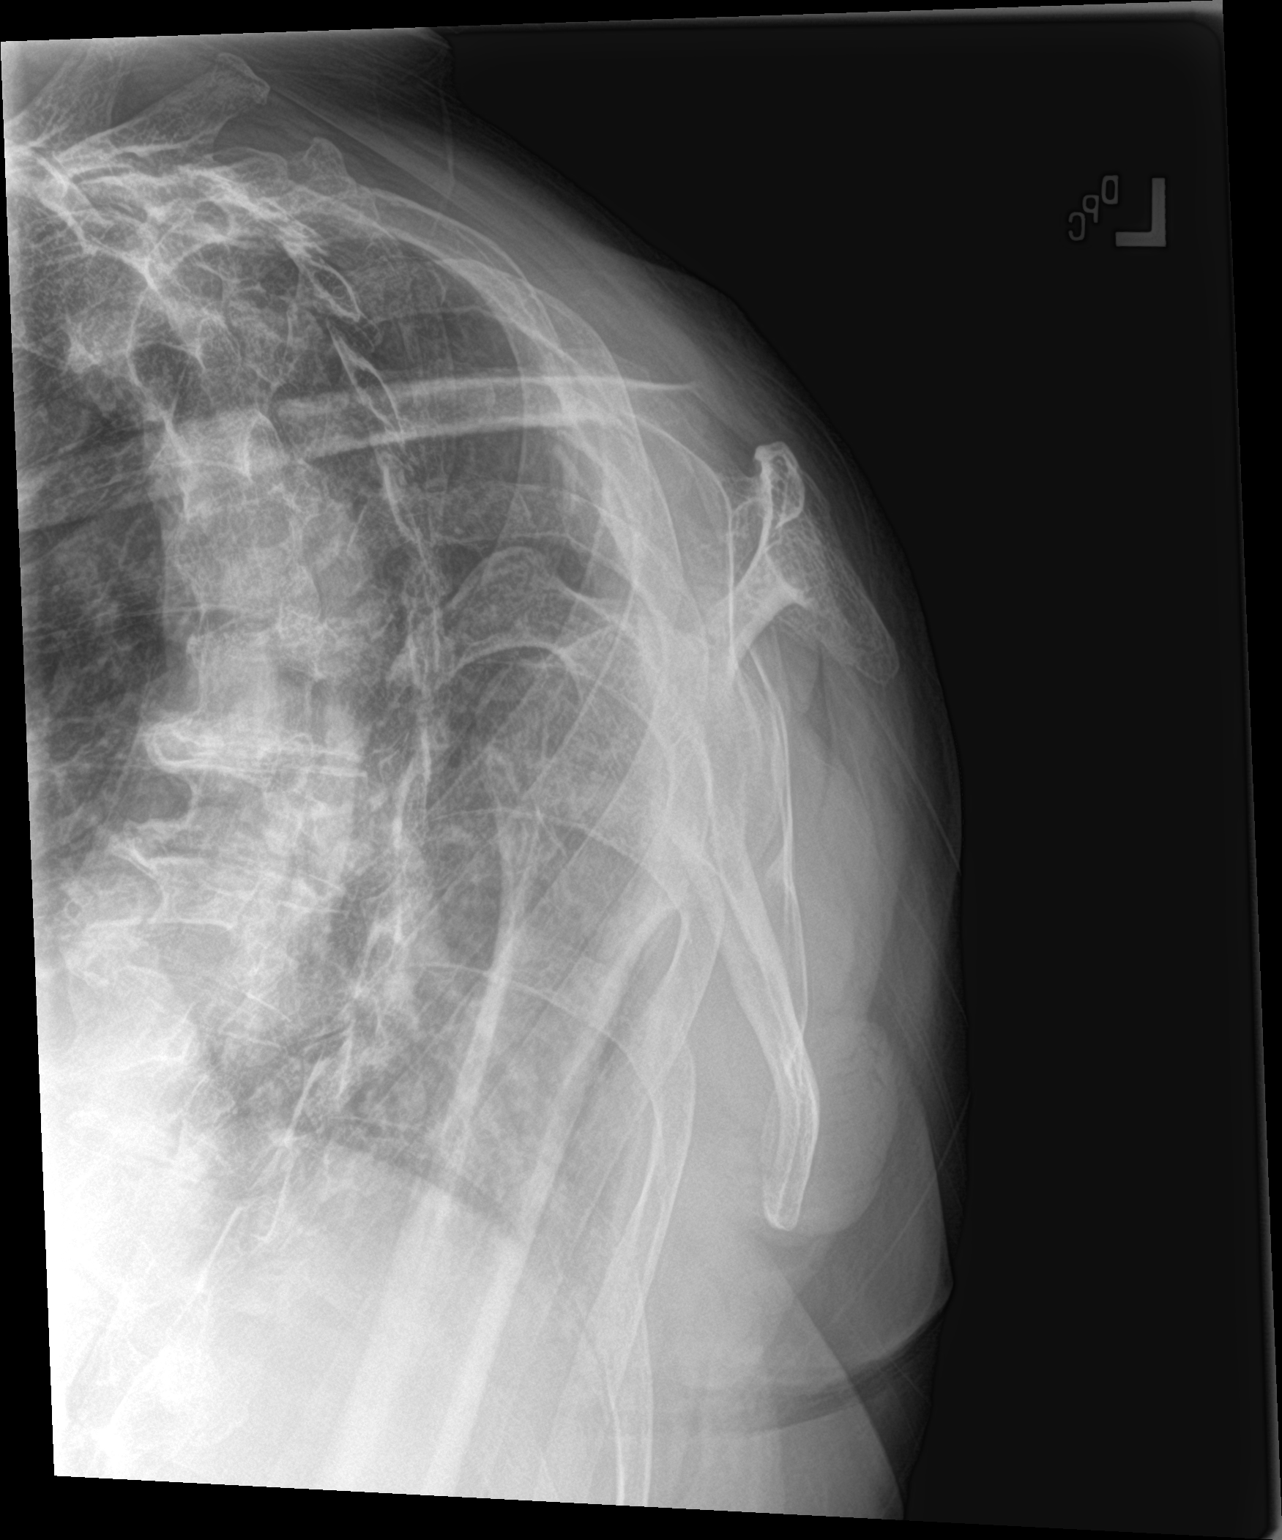

[2 of 2 positions shown; findings below may reference images not displayed]

FINDINGS: The glenohumeral joint is intact. AC separation is suspected. A
distal clavicle fracture is suspected.
IMPRESSION: 1. Probable AC separation and fracture. Recommend clavicle
radiographs with and without weight-bearing.

## 2019-05-03 DIAGNOSIS — D649 Anemia, unspecified: Secondary | ICD-10-CM | POA: Diagnosis not present

## 2019-05-03 DIAGNOSIS — E785 Hyperlipidemia, unspecified: Secondary | ICD-10-CM | POA: Diagnosis not present

## 2019-05-03 DIAGNOSIS — R531 Weakness: Secondary | ICD-10-CM | POA: Diagnosis not present

## 2019-05-03 DIAGNOSIS — R42 Dizziness and giddiness: Secondary | ICD-10-CM | POA: Diagnosis not present

## 2019-05-03 DIAGNOSIS — R7989 Other specified abnormal findings of blood chemistry: Secondary | ICD-10-CM | POA: Diagnosis not present

## 2019-05-03 DIAGNOSIS — Z79899 Other long term (current) drug therapy: Secondary | ICD-10-CM | POA: Diagnosis not present

## 2019-05-03 LAB — CBC AND DIFFERENTIAL
HCT: 40 — AB (ref 41–53)
Hemoglobin: 13.6 (ref 13.5–17.5)
Platelets: 191 (ref 150–399)
WBC: 6.4

## 2019-05-03 LAB — BASIC METABOLIC PANEL
BUN: 40 — AB (ref 4–21)
Creatinine: 2.4 — AB (ref 0.6–1.3)
Glucose: 138
Potassium: 4.5 (ref 3.4–5.3)
Sodium: 140 (ref 137–147)

## 2019-05-03 LAB — HEPATIC FUNCTION PANEL
ALT: 13 (ref 10–40)
AST: 21 (ref 14–40)
Alkaline Phosphatase: 91 (ref 25–125)
Bilirubin, Total: 0.3

## 2019-05-04 ENCOUNTER — Encounter: Payer: Self-pay | Admitting: Internal Medicine

## 2019-05-04 ENCOUNTER — Other Ambulatory Visit: Payer: Self-pay

## 2019-05-04 ENCOUNTER — Non-Acute Institutional Stay: Payer: Medicare HMO | Admitting: Internal Medicine

## 2019-05-04 VITALS — BP 128/60 | HR 63 | Temp 98.1°F | Ht 64.0 in | Wt 163.0 lb

## 2019-05-04 DIAGNOSIS — J320 Chronic maxillary sinusitis: Secondary | ICD-10-CM | POA: Diagnosis not present

## 2019-05-04 DIAGNOSIS — E039 Hypothyroidism, unspecified: Secondary | ICD-10-CM

## 2019-05-04 DIAGNOSIS — I5022 Chronic systolic (congestive) heart failure: Secondary | ICD-10-CM

## 2019-05-04 DIAGNOSIS — N184 Chronic kidney disease, stage 4 (severe): Secondary | ICD-10-CM | POA: Diagnosis not present

## 2019-05-04 DIAGNOSIS — R001 Bradycardia, unspecified: Secondary | ICD-10-CM

## 2019-05-04 DIAGNOSIS — I4892 Unspecified atrial flutter: Secondary | ICD-10-CM | POA: Diagnosis not present

## 2019-05-04 DIAGNOSIS — I952 Hypotension due to drugs: Secondary | ICD-10-CM
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
## 2019-05-09 ENCOUNTER — Encounter: Payer: Self-pay | Admitting: Internal Medicine

## 2019-05-12 DIAGNOSIS — Z20828 Contact with and (suspected) exposure to other viral communicable diseases: Secondary | ICD-10-CM | POA: Diagnosis not present

## 2019-05-16 LAB — NOVEL CORONAVIRUS, NAA: SARS-CoV-2, NAA: NEGATIVE

## 2019-05-17 DIAGNOSIS — Z20828 Contact with and (suspected) exposure to other viral communicable diseases: Secondary | ICD-10-CM | POA: Diagnosis not present

## 2019-05-19 ENCOUNTER — Encounter: Payer: Self-pay | Admitting: Internal Medicine

## 2019-06-15 DIAGNOSIS — Z9189 Other specified personal risk factors, not elsewhere classified: Secondary | ICD-10-CM | POA: Diagnosis not present

## 2019-06-16 LAB — NOVEL CORONAVIRUS, NAA: SARS-CoV-2, NAA: NOT DETECTED

## 2019-06-22 ENCOUNTER — Ambulatory Visit (HOSPITAL_COMMUNITY): Payer: Medicare HMO

## 2019-06-22 ENCOUNTER — Encounter (HOSPITAL_COMMUNITY): Payer: Medicare HMO | Admitting: Internal Medicine

## 2019-06-23 DIAGNOSIS — Z20828 Contact with and (suspected) exposure to other viral communicable diseases: Secondary | ICD-10-CM | POA: Diagnosis not present

## 2019-06-24 LAB — NOVEL CORONAVIRUS, NAA: SARS-CoV-2, NAA: NOT DETECTED

## 2019-06-29 DIAGNOSIS — Z9189 Other specified personal risk factors, not elsewhere classified: Secondary | ICD-10-CM | POA: Diagnosis not present

## 2019-07-05 DIAGNOSIS — Z9189 Other specified personal risk factors, not elsewhere classified: Secondary | ICD-10-CM | POA: Diagnosis not present

## 2019-07-06 ENCOUNTER — Encounter: Payer: Self-pay | Admitting: Internal Medicine

## 2019-07-12 DIAGNOSIS — Z9189 Other specified personal risk factors, not elsewhere classified: Secondary | ICD-10-CM | POA: Diagnosis not present

## 2019-07-14 ENCOUNTER — Ambulatory Visit (HOSPITAL_COMMUNITY): Payer: Medicare HMO

## 2019-07-14 ENCOUNTER — Encounter (HOSPITAL_COMMUNITY): Payer: Medicare HMO | Admitting: Internal Medicine

## 2019-07-19 DIAGNOSIS — Z9189 Other specified personal risk factors, not elsewhere classified: Secondary | ICD-10-CM | POA: Diagnosis not present

## 2019-07-27 DIAGNOSIS — Z9189 Other specified personal risk factors, not elsewhere classified: Secondary | ICD-10-CM | POA: Diagnosis not present

## 2019-07-28 ENCOUNTER — Ambulatory Visit (HOSPITAL_COMMUNITY)
Admission: RE | Admit: 2019-07-28 | Discharge: 2019-07-28 | Disposition: A | Discharge status: Home / Self Care | Payer: Medicare HMO | Source: Ambulatory Visit | Attending: Internal Medicine | Admitting: Internal Medicine

## 2019-07-28 ENCOUNTER — Encounter (HOSPITAL_COMMUNITY): Payer: Self-pay | Admitting: Internal Medicine

## 2019-07-28 ENCOUNTER — Ambulatory Visit (HOSPITAL_BASED_OUTPATIENT_CLINIC_OR_DEPARTMENT_OTHER)
Admission: RE | Admit: 2019-07-28 | Discharge: 2019-07-28 | Disposition: A | Discharge status: Home / Self Care | Payer: Medicare HMO | Source: Ambulatory Visit | Attending: Internal Medicine | Admitting: Internal Medicine

## 2019-07-28 ENCOUNTER — Other Ambulatory Visit: Payer: Self-pay

## 2019-07-28 VITALS — BP 154/48 | HR 60 | Wt 160.0 lb

## 2019-07-28 DIAGNOSIS — I4891 Unspecified atrial fibrillation: Secondary | ICD-10-CM | POA: Insufficient documentation

## 2019-07-28 DIAGNOSIS — Z79899 Other long term (current) drug therapy: Secondary | ICD-10-CM | POA: Diagnosis not present

## 2019-07-28 DIAGNOSIS — I5022 Chronic systolic (congestive) heart failure: Secondary | ICD-10-CM | POA: Diagnosis not present

## 2019-07-28 DIAGNOSIS — I351 Nonrheumatic aortic (valve) insufficiency: Secondary | ICD-10-CM | POA: Diagnosis not present

## 2019-07-28 DIAGNOSIS — I429 Cardiomyopathy, unspecified: Secondary | ICD-10-CM | POA: Insufficient documentation

## 2019-07-28 DIAGNOSIS — J449 Chronic obstructive pulmonary disease, unspecified: Secondary | ICD-10-CM | POA: Insufficient documentation

## 2019-07-28 DIAGNOSIS — Z7982 Long term (current) use of aspirin: Secondary | ICD-10-CM | POA: Diagnosis not present

## 2019-07-28 DIAGNOSIS — I447 Left bundle-branch block, unspecified: Secondary | ICD-10-CM

## 2019-07-28 DIAGNOSIS — I2510 Atherosclerotic heart disease of native coronary artery without angina pectoris: Secondary | ICD-10-CM | POA: Diagnosis not present

## 2019-07-28 DIAGNOSIS — M419 Scoliosis, unspecified: Secondary | ICD-10-CM | POA: Diagnosis not present

## 2019-07-28 DIAGNOSIS — Z7901 Long term (current) use of anticoagulants: Secondary | ICD-10-CM | POA: Insufficient documentation

## 2019-07-28 DIAGNOSIS — E782 Mixed hyperlipidemia: Secondary | ICD-10-CM | POA: Diagnosis not present

## 2019-07-28 DIAGNOSIS — I4892 Unspecified atrial flutter: Secondary | ICD-10-CM | POA: Diagnosis not present

## 2019-07-28 DIAGNOSIS — M1990 Unspecified osteoarthritis, unspecified site: Secondary | ICD-10-CM | POA: Diagnosis not present

## 2019-07-28 DIAGNOSIS — E039 Hypothyroidism, unspecified: Secondary | ICD-10-CM | POA: Insufficient documentation

## 2019-07-28 DIAGNOSIS — N1832 Chronic kidney disease, stage 3b: Secondary | ICD-10-CM | POA: Diagnosis not present

## 2019-07-28 DIAGNOSIS — Z85828 Personal history of other malignant neoplasm of skin: Secondary | ICD-10-CM | POA: Insufficient documentation

## 2019-07-28 LAB — CBC
HCT: 41.7 % (ref 39.0–52.0)
Hemoglobin: 13.3 g/dL (ref 13.0–17.0)
MCH: 32 pg (ref 26.0–34.0)
MCHC: 31.9 g/dL (ref 30.0–36.0)
MCV: 100.2 fL — ABNORMAL HIGH (ref 80.0–100.0)
Platelets: 221 10*3/uL (ref 150–400)
RBC: 4.16 MIL/uL — ABNORMAL LOW (ref 4.22–5.81)
RDW: 12 % (ref 11.5–15.5)
WBC: 7.1 10*3/uL (ref 4.0–10.5)
nRBC: 0 % (ref 0.0–0.2)

## 2019-07-28 LAB — COMPREHENSIVE METABOLIC PANEL
ALT: 16 U/L (ref 0–44)
AST: 25 U/L (ref 15–41)
Albumin: 3.5 g/dL (ref 3.5–5.0)
Alkaline Phosphatase: 60 U/L (ref 38–126)
Anion gap: 8 (ref 5–15)
BUN: 41 mg/dL — ABNORMAL HIGH (ref 8–23)
CO2: 26 mmol/L (ref 22–32)
Calcium: 8.7 mg/dL — ABNORMAL LOW (ref 8.9–10.3)
Chloride: 103 mmol/L (ref 98–111)
Creatinine, Ser: 2.36 mg/dL — ABNORMAL HIGH (ref 0.61–1.24)
GFR calc Af Amer: 29 mL/min — ABNORMAL LOW (ref >60)
GFR calc non Af Amer: 25 mL/min — ABNORMAL LOW (ref >60)
Glucose, Bld: 88 mg/dL (ref 70–99)
Potassium: 4.5 mmol/L (ref 3.5–5.1)
Sodium: 137 mmol/L (ref 135–145)
Total Bilirubin: 0.6 mg/dL (ref 0.3–1.2)
Total Protein: 7.2 g/dL (ref 6.5–8.1)

## 2019-07-28 LAB — T4, FREE: Free T4: 1.94 ng/dL — ABNORMAL HIGH (ref 0.61–1.12)

## 2019-07-28 LAB — TSH: TSH: 1.587 u[IU]/mL (ref 0.350–4.500)

## 2019-07-28 NOTE — Patient Instructions (Signed)
Following Instructions sent to [facility]:   No Change for Now  Follow up in 6 months

## 2019-07-28 NOTE — Progress Notes (Signed)
  Echocardiogram 2D Echocardiogram has been performed.  [name] 07/28/2019, 11:02 AM

## 2019-07-28 NOTE — Progress Notes (Signed)
ADVANCED HEART FAILURE CLINIC  Patient ID: [name], male   DOB: 12-Dec-1935, 83 y.o.   MRN: [id number]   ADVANCED HEART FAILURE NOTE PCP: Dr Willey Blade  HPI: [name] is a 82 y/o male with h/o cognitive impairment, renal insufficiency (baseline about 1.5-1.6), severe CHF due to NICM (EF 15-20%), atrial flutter (s/p DC-CV in April 2012) and LBBB. He and his family are not interested in ICD.   12/2010: Coronary angiograms showed insignifcant CAD. Right heart cath (on milrinone) showed a right atrial pressure of about 12, PA pressure was 45/79 with a mean of 27,pulmonary capillary wedge pressure was 12, LV pressure was 85/5 with an EDP of about 8.  Fick cardiac output was 4.4 with a cardiac index of 2.3.  Pulmonary artery saturations were 61 and 63%.  Pulmonary vascular resistance was 3.1 Woods units.   12/2010: TEE-guided cardioversion that showed an EF of about 15-20%.  There was no evidence of left atrial appendage thrombus.  He was successfully cardioverted with 1 shock.  He was started on amiodarone to help maintain sinus rhythm.Given his LBBB we discussed the possibility of BiV-ICD but decided to defer as he was doing so well.  He returns today. Has been at [facility]. Says he feels pretty good. Recently had skin cancer removed on his scalp.. Denies CP, SOB, orthopnea or PND. No edema. No dizziness.  Echo today EF 40-45%. AI appears mild to moderate visually. Pressure half time seems at least moderate     02/2011: ECHO with EF 99-24%, Grade 1 diastolic dysfunction, ventricular septum dyssynergy.  Mildly reduced RV systolic function.   06/28/12 ECHO EF 25-30% 11/2015: Echo EF 35%.   Labs (9/16): K 4.5 Creatinine 1.8 TC 144 TG 104 HDL 45 LDL 78 Labs (10/17) K 4.8, Creatinine 2.31.   ROS: All systems negative except as listed in HPI, PMH and Problem List.  Past Medical History:  Diagnosis Date  . Arthritis   . Atrial fibrillation ([place])   . Atrial flutter ([place])   .  Cardiomyopathy    Nonischemic, LVEF 20%  . CHF (congestive heart failure) ([place])   . Chronic renal insufficiency   . COPD (chronic obstructive pulmonary disease) ([place])   . Fracture of unspecified part of left clavicle, initial encounter for closed fracture   . Hypothyroidism   . Left ankle sprain   . Left bundle branch block   . Mixed hyperlipidemia   . Renal failure   . Scoliosis     Current Outpatient Medications  Medication Sig Dispense Refill  . acetaminophen (TYLENOL) 325 MG tablet Take 650 mg by mouth every 6 (six) hours as needed.    [name] amiodarone (PACERONE) 200 MG tablet TAKE 1 TABLET BY MOUTH ONCE DAILY 30 tablet 11  . ammonium lactate (AMLACTIN) 12 % cream Apply topically as needed for dry skin.    [name] apixaban (ELIQUIS) 2.5 MG TABS tablet Take 1 tablet (2.5 mg total) by mouth 2 (two) times daily. Please cancel all previous orders for current medication. Change in dosage or pill size. 30 tablet 6  . aspirin EC 81 MG tablet Take 81 mg by mouth daily.    . furosemide (LASIX) 40 MG tablet Take 1 tablet (40 mg total) by mouth every other day.    . hydrALAZINE (APRESOLINE) 25 MG tablet TAKE 1 TABLET BY MOUTH THREE TIMES DAILY 90 tablet 11  . hydrocortisone valerate cream (WESTCORT) 0.2 % Apply 1 application topically 2 (two) times daily.    [name]  isosorbide mononitrate (IMDUR) 30 MG 24 hr tablet TAKE 1 TABLET BY MOUTH ONCE DAILY IN THE MORNING 30 tablet 6  . ketoconazole (NIZORAL) 2 % cream Apply 1 application topically daily.    [name] ketoconazole (NIZORAL) 2 % shampoo Apply 1 application topically 2 (two) times a week.    . levothyroxine (SYNTHROID, LEVOTHROID) 175 MCG tablet Take 175 mcg by mouth daily before breakfast.     . loratadine (CLARITIN) 10 MG tablet Take 10 mg by mouth daily.    [name] losartan (COZAAR) 25 MG tablet TAKE 1 TABLET BY MOUTH ONCE DAILY 30 tablet 5  . spironolactone (ALDACTONE) 25 MG tablet TAKE ONE TABLET BY MOUTH ONCE DAILY 90 tablet 3   No current  facility-administered medications for this encounter.      PHYSICAL EXAM: Vitals:   07/28/19 1113  BP: (!) 154/48  Pulse: 60  SpO2: 100%  Weight: 72.6 kg (160 lb)   General:  Sitting in WC. + speech impediment No resp difficulty  Severe kyphoscoliosis HEENT: normal Neck: supple. no JVD. Carotids 2+ bilat; no bruits. No lymphadenopathy or thryomegaly appreciated. Cor: PMI nondisplaced. Regular rate & rhythm. Soft AI Lungs: clear Abdomen: soft, nontender, nondistended. No hepatosplenomegaly. No bruits or masses. Good bowel sounds. Extremities: no cyanosis, clubbing, rash, trace edema Neuro: alert & orientedx3, cranial nerves grossly intact. moves all 4 extremities w/o difficulty. Affect pleasant   ASSESSMENT & PLAN:  1. Chronic systolic HF ECHO 8657  EF%. 25-30% Offered ICD in the past but he declined.  Echo 3/17 EF 35%' - Echo today EF 40-45% Mild to moderate AI Personally reviewed - Stable NYHA II.  - Overall volume status looks good. Of note, has failed lasix titration in past due to AKI. Continue current regimen. Continue to use compression stockings - Off carvedilol with bradycardia - Continue hydralazine 25 mg tid/Imdur 30 mg daily  - Continue losartan 50 mg daily. (has not wanted to switch to [facility] due to cost). Need to watch renal function closely. May have to stop in near future.  - Continue Arlyce Harman.  - Will check labs today if creatinine up from 2.5 or 2.6 will need to consider stopping spiro +/- losartan but has tolerated for a long time  - Continue compression hose - Repeat echo at next visit   2. AFL  - maintaining SR on amio 200 daily. On apixaban. No bleeding  - With age >= 31 and Cr > 1.5 on apixaban 2.5 bid  - check thyroid and CMET  3. CKD, stage 3b  - creatinine stable at 2.6 - will recheck today  4. LBBB - stable  5. Bradycardia - has significant underlying conduction disease. Improved off carvedilol  - may need to consider PPM (CRT) at some point  but does not meet criteria yet  Glori Bickers, MD 11:25 AM

## 2019-07-29 LAB — T3, FREE: T3, Free: 1.5 pg/mL — ABNORMAL LOW (ref 2.0–4.4)

## 2019-08-02 DIAGNOSIS — Z9189 Other specified personal risk factors, not elsewhere classified: Secondary | ICD-10-CM | POA: Diagnosis not present

## 2019-08-10 DIAGNOSIS — Z9189 Other specified personal risk factors, not elsewhere classified: Secondary | ICD-10-CM | POA: Diagnosis not present

## 2019-08-10 DIAGNOSIS — Z20828 Contact with and (suspected) exposure to other viral communicable diseases: Secondary | ICD-10-CM | POA: Diagnosis not present

## 2019-08-29 DIAGNOSIS — E039 Hypothyroidism, unspecified: Secondary | ICD-10-CM | POA: Diagnosis not present

## 2019-08-29 DIAGNOSIS — Z20828 Contact with and (suspected) exposure to other viral communicable diseases: Secondary | ICD-10-CM | POA: Diagnosis not present

## 2019-08-29 DIAGNOSIS — I10 Essential (primary) hypertension: Secondary | ICD-10-CM | POA: Diagnosis not present

## 2019-08-29 DIAGNOSIS — Z9189 Other specified personal risk factors, not elsewhere classified: Secondary | ICD-10-CM | POA: Diagnosis not present

## 2019-08-29 DIAGNOSIS — E785 Hyperlipidemia, unspecified: Secondary | ICD-10-CM | POA: Diagnosis not present

## 2019-08-29 LAB — LIPID PANEL
Cholesterol: 140 (ref 0–200)
HDL: 45 (ref 35–70)
LDL Cholesterol: 69
Triglycerides: 131 (ref 40–160)

## 2019-08-29 LAB — TSH: TSH: 1.9 (ref 0.41–5.90)

## 2019-08-31 ENCOUNTER — Encounter: Payer: Self-pay | Admitting: Internal Medicine

## 2019-08-31 ENCOUNTER — Non-Acute Institutional Stay: Payer: Medicare HMO | Admitting: Internal Medicine

## 2019-08-31 ENCOUNTER — Other Ambulatory Visit: Payer: Self-pay

## 2019-08-31 VITALS — BP 110/61 | HR 58 | Temp 97.4°F | Resp 18 | Ht 64.0 in | Wt 158.0 lb

## 2019-08-31 DIAGNOSIS — I4892 Unspecified atrial flutter: Secondary | ICD-10-CM

## 2019-08-31 DIAGNOSIS — I5022 Chronic systolic (congestive) heart failure: Secondary | ICD-10-CM | POA: Diagnosis not present

## 2019-08-31 DIAGNOSIS — N184 Chronic kidney disease, stage 4 (severe): Secondary | ICD-10-CM

## 2019-08-31 DIAGNOSIS — E039 Hypothyroidism, unspecified: Secondary | ICD-10-CM | POA: Diagnosis not present

## 2019-08-31 DIAGNOSIS — L259 Unspecified contact dermatitis, unspecified cause: Secondary | ICD-10-CM | POA: Diagnosis not present

## 2019-08-31 NOTE — Progress Notes (Signed)
Location:  Occupational psychologist of Service:  Clinic (12)  Provider: Cara Thaxton L. Mariea Clonts, D.O., C.M.D.  Code Status: DNR Goals of Care:  Advanced Directives 09/22/2018  Does Patient Have a Medical Advance Directive? Yes  Type of Advance Directive Out of facility DNR (pink MOST or yellow form);Healthcare Power of Attorney  Does patient want to make changes to medical advance directive? No - Patient declined  Copy of [place] in Chart? Yes - validated most recent copy scanned in chart (See row information)  Would patient like information on creating a medical advance directive? -  Pre-existing out of facility DNR order (yellow form or pink MOST form) Yellow form placed in chart (order not valid for inpatient use)     Chief Complaint  Patient presents with  . Medical Management of Chronic Issues    16mth follow-up    HPI: Patient is a 83 y.o. male seen today for medical management of chronic diseases.    Pt seen and examined.  He is doing well and only complaint from him and nursing was a dry scaly spot on his right thumb.  Nursing thought it was pulsating.  Pt has been putting peroxide on it which I'd advise against.    No difficulties with chest pain, shortness of breath or dizziness.  HR remains mildly bradycardic, but not severe as it had been at one point when he felt bad.    Past Medical History:  Diagnosis Date  . Arthritis   . Atrial fibrillation ([place])   . Atrial flutter ([place])   . Cardiomyopathy    Nonischemic, LVEF 20%  . CHF (congestive heart failure) ([place])   . Chronic renal insufficiency   . COPD (chronic obstructive pulmonary disease) ([place])   . Fracture of unspecified part of left clavicle, initial encounter for closed fracture   . Hypothyroidism   . Left ankle sprain   . Left bundle branch block   . Mixed hyperlipidemia   . Renal failure   . Scoliosis     Past Surgical History:  Procedure Laterality Date  . EYE SURGERY      . LAPAROSCOPIC CHOLECYSTECTOMY  2008  . left foot surgery  1998  . MASS EXCISION Left 06/26/2016   Procedure: EXCISION Mucoid tumor;  Surgeon: Daryll Brod, MD;  Location: [place];  Service: Orthopedics;  Laterality: Left;  FAB  . Right inguinal herniorrhaphy    . ROTATION FLAP Left 06/26/2016   Procedure: debridement of distal interphalangeal possible, ROTATION FLAP left index finger;  Surgeon: Daryll Brod, MD;  Location: [place];  Service: Orthopedics;  Laterality: Left;  . SKIN CANCER REMOVED  LEFT EAR    . TONSILLECTOMY      No Known Allergies  Outpatient Encounter Medications as of 08/31/2019  Medication Sig  . acetaminophen (TYLENOL) 325 MG tablet Take 650 mg by mouth every 6 (six) hours as needed.  [name] amiodarone (PACERONE) 200 MG tablet TAKE 1 TABLET BY MOUTH ONCE DAILY  . ammonium lactate (AMLACTIN) 12 % cream Apply topically as needed for dry skin.  [name] apixaban (ELIQUIS) 2.5 MG TABS tablet Take 1 tablet (2.5 mg total) by mouth 2 (two) times daily. Please cancel all previous orders for current medication. Change in dosage or pill size.  [name] aspirin EC 81 MG tablet Take 81 mg by mouth daily.  . furosemide (LASIX) 40 MG tablet Take 1 tablet (40 mg total) by mouth every other day.  . hydrALAZINE (APRESOLINE) 25  MG tablet TAKE 1 TABLET BY MOUTH THREE TIMES DAILY  . hydrocortisone valerate cream (WESTCORT) 0.2 % Apply 1 application topically 2 (two) times daily.  . isosorbide mononitrate (IMDUR) 30 MG 24 hr tablet TAKE 1 TABLET BY MOUTH ONCE DAILY IN THE MORNING  . ketoconazole (NIZORAL) 2 % cream Apply 1 application topically daily.  [name] ketoconazole (NIZORAL) 2 % shampoo Apply 1 application topically 2 (two) times a week.  . levothyroxine (SYNTHROID, LEVOTHROID) 175 MCG tablet Take 175 mcg by mouth daily before breakfast.   . loratadine (CLARITIN) 10 MG tablet Take 10 mg by mouth daily.  [name] losartan (COZAAR) 25 MG tablet TAKE 1 TABLET BY MOUTH ONCE DAILY  . spironolactone (ALDACTONE) 25 MG  tablet TAKE ONE TABLET BY MOUTH ONCE DAILY   No facility-administered encounter medications on file as of 08/31/2019.    Review of Systems:  Review of Systems  Constitutional: Negative for chills and fever.  HENT: Positive for hearing loss. Negative for congestion and sore throat.   Eyes:       Glasses; unable to participate in good eye exam per family  Respiratory: Negative for cough and shortness of breath.   Cardiovascular: Negative for chest pain, palpitations and leg swelling.  Gastrointestinal: Negative for abdominal pain.  Genitourinary: Negative for dysuria.  Musculoskeletal: Negative for falls and joint pain.  Skin: Positive for itching and rash.       On right thumb  Neurological: Negative for dizziness and loss of consciousness.  Endo/Heme/Allergies: Bruises/bleeds easily.  Psychiatric/Behavioral: Negative for depression and memory loss. The patient is not nervous/anxious and does not have insomnia.     Health Maintenance  Topic Date Due  . TETANUS/TDAP  03/06/2028  . INFLUENZA VACCINE  Completed  . PNA vac Low Risk Adult  Completed    Physical Exam: Vitals:   08/31/19 1417  BP: 110/61  Pulse: (!) 58  Resp: 18  Temp: (!) 97.4 F (36.3 C)  TempSrc: Oral  SpO2: 98%  Weight: 158 lb (71.7 kg)  Height: 5\' 4"  (1.626 m)   Body mass index is 27.12 kg/m. Physical Exam Vitals reviewed.  Constitutional:      Appearance: Normal appearance.  HENT:     Head: Normocephalic and atraumatic.  Cardiovascular:     Rate and Rhythm: Regular rhythm. Bradycardia present.     Pulses: Normal pulses.     Heart sounds: Normal heart sounds.  Pulmonary:     Effort: Pulmonary effort is normal.     Breath sounds: Normal breath sounds. No rales.  Abdominal:     General: Bowel sounds are normal. There is no distension.     Palpations: Abdomen is soft. There is no mass.     Tenderness: There is no abdominal tenderness. There is no guarding or rebound.  Musculoskeletal:         General: Normal range of motion.     Right lower leg: No edema.     Left lower leg: No edema.     Comments: Kyphosis  Skin:    General: Skin is warm and dry.     Coloration: Skin is pale.     Comments: Dry scaly patch about size of nickel on right thumb, itching him   Neurological:     Mental Status: He is alert and oriented to person, place, and time. Mental status is at baseline.     Comments: Speech difficult to understand due to his underlying developmental disorder  Psychiatric:  Mood and Affect: Mood normal.        Behavior: Behavior normal.     Labs reviewed: Basic Metabolic Panel: Recent Labs    11/09/18 1108 05/03/19 0000 07/28/19 1226 07/28/19 1227 08/29/19 0530  NA 137 140  --  137  --   K 4.8 4.5  --  4.5  --   CL 105  --   --  103  --   CO2 27  --   --  26  --   GLUCOSE 93  --   --  88  --   BUN 43* 40*  --  41*  --   CREATININE 2.54* 2.4*  --  2.36*  --   CALCIUM 8.5*  --   --  8.7*  --   TSH 1.330  --  1.587  --  1.90   Liver Function Tests: Recent Labs    11/09/18 1108 05/03/19 0000 07/28/19 1227  AST 22 21 25   ALT 14 13 16   ALKPHOS 57 91 60  BILITOT 0.8  --  0.6  PROT 6.7  --  7.2  ALBUMIN 3.3*  --  3.5   No results for input(s): LIPASE, AMYLASE in the last 8760 hours. No results for input(s): AMMONIA in the last 8760 hours. CBC: Recent Labs    11/09/18 1108 05/03/19 0000 07/28/19 1227  WBC 5.3 6.4 7.1  HGB 11.9* 13.6 13.3  HCT 38.3* 40* 41.7  MCV 101.9*  --  100.2*  PLT 182 191 221   Lipid Panel: Recent Labs    08/29/19 0530  CHOL 140  HDL 45  LDLCALC 69  TRIG 131   Lab Results  Component Value Date   HGBA1C 5.5 10/18/2017    Assessment/Plan 1. Contact dermatitis, unspecified contact dermatitis type, unspecified trigger -will add hydrocortisone cream for this finger bid until resolved -avoid cleaning with peroxide which will worsen dryness and irritation  2. Chronic systolic heart failure (HCC) -recently stable  with lasix 40mg  qod, aldactone 25mg  daily, losartan 25mg  daily -cannot take entresto with his degree of CKD -followed closely by Dr. Haroldine Laws  3. Acquired hypothyroidism -cont levothyroxine Lab Results  Component Value Date   TSH 1.90 08/29/2019   4. CKD (chronic kidney disease) stage 4, GFR 15-29 ml/min (HCC) -has been stable Lab Results  Component Value Date   CREATININE 2.36 (H) 07/28/2019  -Avoid nephrotoxic agents like nsaids, dose adjust renally excreted meds,avoid dehydration  5.  Atrial flutter -cont eliquis, amiodarone -not a candidate for rate control meds like bb and ccb due to his bradycardia  Labs/tests ordered:  No new; needs AWV Next appt:  12/21/2019 med mgt  Shevawn Langenberg L. Mylik Pro, D.O. [facility] [address], [place] [address] Cell Phone (Mon-Fri 8am-5pm):  [phone] On Call:  [phone] & follow prompts after 5pm & weekends Office Phone:  [phone] Office Fax:  [phone]

## 2019-09-02 DIAGNOSIS — Z9189 Other specified personal risk factors, not elsewhere classified: Secondary | ICD-10-CM | POA: Diagnosis not present

## 2019-09-02 DIAGNOSIS — Z20828 Contact with and (suspected) exposure to other viral communicable diseases: Secondary | ICD-10-CM | POA: Diagnosis not present

## 2019-09-05 ENCOUNTER — Non-Acute Institutional Stay: Payer: Medicare HMO | Admitting: Adult Health

## 2019-09-05 ENCOUNTER — Encounter: Payer: Self-pay | Admitting: Adult Health

## 2019-09-05 DIAGNOSIS — Z0000 Encounter for general adult medical examination without abnormal findings: Secondary | ICD-10-CM

## 2019-09-05 DIAGNOSIS — Z20828 Contact with and (suspected) exposure to other viral communicable diseases: Secondary | ICD-10-CM | POA: Diagnosis not present

## 2019-09-05 DIAGNOSIS — Z9189 Other specified personal risk factors, not elsewhere classified: Secondary | ICD-10-CM | POA: Diagnosis not present

## 2019-09-05 NOTE — Progress Notes (Signed)
Subjective:   [name] is a 83 y.o. male who presents for Medicare Annual/Subsequent preventive examination at [facility].   Review of Systems:   Cardiac Risk Factors include: advanced age (>33men, >35 women);hypertension     Objective:    Vitals: Wt 158 lb 6.4 oz (71.8 kg)   BMI 27.19 kg/m   Body mass index is 27.19 kg/m.  Advanced Directives 09/05/2019 09/05/2019 09/22/2018 06/16/2018 03/03/2018 02/24/2018 10/17/2017  Does Patient Have a Medical Advance Directive? Yes Yes Yes Yes Yes Yes -  Type of Paramedic of [place];Living will;Out of facility DNR (pink MOST or yellow form) [place];Living will Out of facility DNR (pink MOST or yellow form);Healthcare Power of [place];Out of facility DNR (pink MOST or yellow form) Out of facility DNR (pink MOST or yellow form);Living will;Healthcare Power of Attorney Out of facility DNR (pink MOST or yellow form);Living will;Healthcare Power of Attorney -  Does patient want to make changes to medical advance directive? No - Patient declined No - Patient declined No - Patient declined No - Patient declined No - Patient declined No - Patient declined -  Copy of [place] in Chart? Yes - validated most recent copy scanned in chart (See row information) Yes - validated most recent copy scanned in chart (See row information) Yes - validated most recent copy scanned in chart (See row information) Yes Yes Yes -  Would patient like information on creating a medical advance directive? - - - - - - No - Patient declined  Pre-existing out of facility DNR order (yellow form or pink MOST form) - - Yellow form placed in chart (order not valid for inpatient use) Yellow form placed in chart (order not valid for inpatient use) Yellow form placed in chart (order not valid for inpatient use) Yellow form placed in chart (order not valid for inpatient use) -      Tobacco Social History   Tobacco Use  Smoking Status Never Smoker  Smokeless Tobacco Never Used     Counseling given: Not Answered   Clinical Intake:  Pre-visit preparation completed: No  Pain : No/denies pain     Diabetes: No  How often do you need to have someone help you when you read instructions, pamphlets, or other written materials from your doctor or pharmacy?: 4 - Often What is the last grade level you completed in school?: did not complete high school  Interpreter Needed?: No  Information entered by :: Cindi Carbon NP  Past Medical History:  Diagnosis Date  . Arthritis   . Atrial fibrillation ([place])   . Atrial flutter ([place])   . Cardiomyopathy    Nonischemic, LVEF 20%  . CHF (congestive heart failure) ([place])   . Chronic renal insufficiency   . COPD (chronic obstructive pulmonary disease) ([place])   . Fracture of unspecified part of left clavicle, initial encounter for closed fracture   . Hypothyroidism   . Left ankle sprain   . Left bundle branch block   . Mixed hyperlipidemia   . Renal failure   . Scoliosis    Past Surgical History:  Procedure Laterality Date  . EYE SURGERY    . LAPAROSCOPIC CHOLECYSTECTOMY  2008  . left foot surgery  1998  . MASS EXCISION Left 06/26/2016   Procedure: EXCISION Mucoid tumor;  Surgeon: Daryll Brod, MD;  Location: [facility];  Service: Orthopedics;  Laterality: Left;  FAB  . Right inguinal  herniorrhaphy    . ROTATION FLAP Left 06/26/2016   Procedure: debridement of distal interphalangeal possible, ROTATION FLAP left index finger;  Surgeon: Daryll Brod, MD;  Location: [facility];  Service: Orthopedics;  Laterality: Left;  . SKIN CANCER REMOVED  LEFT EAR    . TONSILLECTOMY     Family History  Problem Relation Age of Onset  . Heart disease Father        Died age 43  . Stroke Mother        Died age 68  . Diabetes Mother   . Pancreatic cancer Brother   . Arthritis Brother   . Throat cancer Brother   . Arthritis Brother   .  Diabetes Sister   . Seizures Sister    Social History   Socioeconomic History  . Marital status: Married    Spouse name: Not on file  . Number of children: Not on file  . Years of education: Not on file  . Highest education level: Not on file  Occupational History  . Occupation: Retired    Comment: Previously worked at [employer]  . Smoking status: Never Smoker  . Smokeless tobacco: Never Used  Substance and Sexual Activity  . Alcohol use: Not Currently  . Drug use: No  . Sexual activity: Not on file  Other Topics Concern  . Not on file  Social History Narrative   Tobacco use, amount per day now: No      Past tobacco use, amount per day: No      How many years did you use tobacco: No      Alcohol use (drinks per week): No      Diet: Low Salt       Do you drink/eat things with caffeine? Tea      Marital status: Married             What year were you married? 1975      Do you live in a house, apartment, assisted living, condo, trailer?      Is it one or more stories?      How many persons live in your home?      Do you have any pets in your home?      Current or past profession?      Do you exercise?             How often?      Do you have a living will? Yes      Do you have a DNR form? Yes           If not, do you want to discuss one?      Do you have signed POA/HPOA forms? Yes              Social Determinants of Health   Financial Resource Strain:   . Difficulty of Paying Living Expenses: Not on file  Food Insecurity:   . Worried About Charity fundraiser in the Last Year: Not on file  . Ran Out of Food in the Last Year: Not on file  Transportation Needs:   . Lack of Transportation (Medical): Not on file  . Lack of Transportation (Non-Medical): Not on file  Physical Activity:   . Days of Exercise per Week: Not on file  . Minutes of Exercise per Session: Not on file  Stress:   . Feeling of Stress : Not on file  Social Connections:     .  Frequency of Communication with Friends and Family: Not on file  . Frequency of Social Gatherings with Friends and Family: Not on file  . Attends Religious Services: Not on file  . Active Member of Clubs or Organizations: Not on file  . Attends Archivist Meetings: Not on file  . Marital Status: Not on file    Outpatient Encounter Medications as of 09/05/2019  Medication Sig  . acetaminophen (TYLENOL) 325 MG tablet Take 650 mg by mouth every 6 (six) hours as needed.  [name] amiodarone (PACERONE) 200 MG tablet TAKE 1 TABLET BY MOUTH ONCE DAILY  . ammonium lactate (AMLACTIN) 12 % cream Apply topically as needed for dry skin.  [name] apixaban (ELIQUIS) 2.5 MG TABS tablet Take 1 tablet (2.5 mg total) by mouth 2 (two) times daily. Please cancel all previous orders for current medication. Change in dosage or pill size.  [name] aspirin EC 81 MG tablet Take 81 mg by mouth daily.  . furosemide (LASIX) 40 MG tablet Take 1 tablet (40 mg total) by mouth every other day.  . hydrALAZINE (APRESOLINE) 25 MG tablet TAKE 1 TABLET BY MOUTH THREE TIMES DAILY  . hydrocortisone valerate cream (WESTCORT) 0.2 % Apply 1 application topically 2 (two) times daily.  . isosorbide mononitrate (IMDUR) 30 MG 24 hr tablet TAKE 1 TABLET BY MOUTH ONCE DAILY IN THE MORNING  . ketoconazole (NIZORAL) 2 % cream Apply 1 application topically daily.  [name] ketoconazole (NIZORAL) 2 % shampoo Apply 1 application topically 2 (two) times a week.  . levothyroxine (SYNTHROID, LEVOTHROID) 175 MCG tablet Take 175 mcg by mouth daily before breakfast.   . loratadine (CLARITIN) 10 MG tablet Take 10 mg by mouth daily.  [name] losartan (COZAAR) 25 MG tablet TAKE 1 TABLET BY MOUTH ONCE DAILY  . spironolactone (ALDACTONE) 25 MG tablet TAKE ONE TABLET BY MOUTH ONCE DAILY   No facility-administered encounter medications on file as of 09/05/2019.    Activities of Daily Living In your present state of health, do you have any difficulty performing the  following activities: 09/05/2019  Hearing? N  Difficulty concentrating or making decisions? Y  Walking or climbing stairs? Y  Dressing or bathing? N  Doing errands, shopping? Y  Preparing Food and eating ? Y  Using the Toilet? N  In the past six months, have you accidently leaked urine? N  Do you have problems with loss of bowel control? N  Managing your Medications? Y  Managing your Finances? Y  Housekeeping or managing your Housekeeping? Y  Some recent data might be hidden    Patient Care Team: Gayland Curry, DO as PCP - General (Geriatric Medicine) Gayland Curry, DO as PCP - Internal Medicine (Geriatric Medicine) Royal Hawthorn, NP as Nurse Practitioner (Nurse Practitioner)   Assessment:   This is a routine wellness examination for [place].  Exercise Activities and Dietary recommendations    Goals    . DIET - EAT MORE FRUITS AND VEGETABLES       Fall Risk Fall Risk  09/05/2019 08/31/2019 05/04/2019 09/22/2018 06/16/2018  Falls in the past year? 0 0 0 0 No  Number falls in past yr: 0 0 0 0 -  Injury with Fall? 0 0 0 0 -  Comment - - - - -  Risk for fall due to : Impaired vision - - - -  Follow up Falls evaluation completed - - - -   Is the patient's home free of loose throw rugs in walkways, pet  beds, electrical cords, etc?   yes      Grab bars in the bathroom? yes      Handrails on the stairs?   yes      Adequate lighting?   yes  Timed Get Up and Go Performed: 3 seconds  Depression Screen PHQ 2/9 Scores 09/05/2019 08/31/2019 05/04/2019 09/22/2018  PHQ - 2 Score 0 0 0 0    Cognitive Function MMSE - Mini Mental State Exam 09/05/2019  Orientation to time 5  Orientation to Place 5  Registration 3  Attention/ Calculation 0  Recall 3  Language- name 2 objects 2  Language- repeat 1  Language- follow 3 step command 3  Language- read & follow direction 1  Write a sentence 0  Copy design 0  Total score 23        Immunization History  Administered Date(s)  Administered  . Influenza Inj Mdck Quad Pf 07/04/2016  . Influenza Inj Mdck Quad With Preservative 06/18/2017  . Influenza Split 06/02/2014, 06/02/2014, 06/14/2015  . Influenza, High Dose Seasonal PF 07/14/2019  . Influenza,inj,Quad PF,6+ Mos 07/15/2018  . Pneumococcal Conjugate-13 02/07/2016  . Pneumococcal Polysaccharide-23 03/09/2018  . Tdap 03/06/2018  . Zoster Recombinat (Shingrix) 03/26/2018    Qualifies for Shingles Vaccine? Up to date  Screening Tests Health Maintenance  Topic Date Due  . TETANUS/TDAP  03/06/2028  . INFLUENZA VACCINE  Completed  . PNA vac Low Risk Adult  Completed   Cancer Screenings: Lung: Low Dose CT Chest recommended if Age 57-80 years, 30 pack-year currently smoking OR have quit w/in 15years. Patient does not qualify. Colorectal: aged out  Additional Screenings: not indicated Hepatitis C Screening:      Plan:     I have personally reviewed and noted the following in the patient's chart:   . Medical and social history . Use of alcohol, tobacco or illicit drugs  . Current medications and supplements . Functional ability and status . Nutritional status . Physical activity . Advanced directives . List of other physicians . Hospitalizations, surgeries, and ER visits in previous 12 months . Vitals . Screenings to include cognitive, depression, and falls . Referrals and appointments  In addition, I have reviewed and discussed with patient certain preventive protocols, quality metrics, and best practice recommendations. A written personalized care plan for preventive services as well as general preventive health recommendations were provided to patient.     Royal Hawthorn, NP  09/05/2019

## 2019-09-05 NOTE — Patient Instructions (Signed)
[name] , Thank you for taking time to come for your Medicare Wellness Visit. I appreciate your ongoing commitment to your health goals. Please review the following plan we discussed and let me know if I can assist you in the future.   Screening recommendations/referrals: Colonoscopy aged out    Recommended yearly ophthalmology/optometry visit for glaucoma screening and checkup Recommended yearly dental visit for hygiene and checkup  Vaccinations: Influenza vaccine up to date Pneumococcal vaccine up to date Tdap vaccine up to date Shingles vaccine up to dtae    Advanced directives: reviewed  Conditions/risks identified: cardiac risk  Next appointment: 1 year  Preventive Care 81 Years and Older, Male Preventive care refers to lifestyle choices and visits with your health care provider that can promote health and wellness. What does preventive care include?  A yearly physical exam. This is also called an annual well check.  Dental exams once or twice a year.  Routine eye exams. Ask your health care provider how often you should have your eyes checked.  Personal lifestyle choices, including:  Daily care of your teeth and gums.  Regular physical activity.  Eating a healthy diet.  Avoiding tobacco and drug use.  Limiting alcohol use.  Practicing safe sex.  Taking low doses of aspirin every day.  Taking vitamin and mineral supplements as recommended by your health care provider. What happens during an annual well check? The services and screenings done by your health care provider during your annual well check will depend on your age, overall health, lifestyle risk factors, and family history of disease. Counseling  Your health care provider may ask you questions about your:  Alcohol use.  Tobacco use.  Drug use.  Emotional well-being.  Home and relationship well-being.  Sexual activity.  Eating habits.  History of falls.  Memory and ability to  understand (cognition).  Work and work Statistician. Screening  You may have the following tests or measurements:  Height, weight, and BMI.  Blood pressure.  Lipid and cholesterol levels. These may be checked every 5 years, or more frequently if you are over 37 years old.  Skin check.  Lung cancer screening. You may have this screening every year starting at age 58 if you have a 30-pack-year history of smoking and currently smoke or have quit within the past 15 years.  Fecal occult blood test (FOBT) of the stool. You may have this test every year starting at age 31.  Flexible sigmoidoscopy or colonoscopy. You may have a sigmoidoscopy every 5 years or a colonoscopy every 10 years starting at age 2.  Prostate cancer screening. Recommendations will vary depending on your family history and other risks.  Hepatitis C blood test.  Hepatitis B blood test.  Sexually transmitted disease (STD) testing.  Diabetes screening. This is done by checking your blood sugar (glucose) after you have not eaten for a while (fasting). You may have this done every 1-3 years.  Abdominal aortic aneurysm (AAA) screening. You may need this if you are a current or former smoker.  Osteoporosis. You may be screened starting at age 50 if you are at high risk. Talk with your health care provider about your test results, treatment options, and if necessary, the need for more tests. Vaccines  Your health care provider may recommend certain vaccines, such as:  Influenza vaccine. This is recommended every year.  Tetanus, diphtheria, and acellular pertussis (Tdap, Td) vaccine. You may need a Td booster every 10 years.  Zoster vaccine. You  may need this after age 52.  Pneumococcal 13-valent conjugate (PCV13) vaccine. One dose is recommended after age 61.  Pneumococcal polysaccharide (PPSV23) vaccine. One dose is recommended after age 18. Talk to your health care provider about which screenings and vaccines you  need and how often you need them. This information is not intended to replace advice given to you by your health care provider. Make sure you discuss any questions you have with your health care provider. Document Released: 09/28/2015 Document Revised: 05/21/2016 Document Reviewed: 07/03/2015 Elsevier Interactive Patient Education  2017 [place] Prevention in the Home Falls can cause injuries. They can happen to people of all ages. There are many things you can do to make your home safe and to help prevent falls. What can I do on the outside of my home?  Regularly fix the edges of walkways and driveways and fix any cracks.  Remove anything that might make you trip as you walk through a door, such as a raised step or threshold.  Trim any bushes or trees on the path to your home.  Use bright outdoor lighting.  Clear any walking paths of anything that might make someone trip, such as rocks or tools.  Regularly check to see if handrails are loose or broken. Make sure that both sides of any steps have handrails.  Any raised decks and porches should have guardrails on the edges.  Have any leaves, snow, or ice cleared regularly.  Use sand or salt on walking paths during winter.  Clean up any spills in your garage right away. This includes oil or grease spills. What can I do in the bathroom?  Use night lights.  Install grab bars by the toilet and in the tub and shower. Do not use towel bars as grab bars.  Use non-skid mats or decals in the tub or shower.  If you need to sit down in the shower, use a plastic, non-slip stool.  Keep the floor dry. Clean up any water that spills on the floor as soon as it happens.  Remove soap buildup in the tub or shower regularly.  Attach bath mats securely with double-sided non-slip rug tape.  Do not have throw rugs and other things on the floor that can make you trip. What can I do in the bedroom?  Use night lights.  Make sure  that you have a light by your bed that is easy to reach.  Do not use any sheets or blankets that are too big for your bed. They should not hang down onto the floor.  Have a firm chair that has side arms. You can use this for support while you get dressed.  Do not have throw rugs and other things on the floor that can make you trip. What can I do in the kitchen?  Clean up any spills right away.  Avoid walking on wet floors.  Keep items that you use a lot in easy-to-reach places.  If you need to reach something above you, use a strong step stool that has a grab bar.  Keep electrical cords out of the way.  Do not use floor polish or wax that makes floors slippery. If you must use wax, use non-skid floor wax.  Do not have throw rugs and other things on the floor that can make you trip. What can I do with my stairs?  Do not leave any items on the stairs.  Make sure that there are handrails on both  sides of the stairs and use them. Fix handrails that are broken or loose. Make sure that handrails are as long as the stairways.  Check any carpeting to make sure that it is firmly attached to the stairs. Fix any carpet that is loose or worn.  Avoid having throw rugs at the top or bottom of the stairs. If you do have throw rugs, attach them to the floor with carpet tape.  Make sure that you have a light switch at the top of the stairs and the bottom of the stairs. If you do not have them, ask someone to add them for you. What else can I do to help prevent falls?  Wear shoes that:  Do not have high heels.  Have rubber bottoms.  Are comfortable and fit you well.  Are closed at the toe. Do not wear sandals.  If you use a stepladder:  Make sure that it is fully opened. Do not climb a closed stepladder.  Make sure that both sides of the stepladder are locked into place.  Ask someone to hold it for you, if possible.  Clearly mark and make sure that you can see:  Any grab bars or  handrails.  First and last steps.  Where the edge of each step is.  Use tools that help you move around (mobility aids) if they are needed. These include:  Canes.  Walkers.  Scooters.  Crutches.  Turn on the lights when you go into a dark area. Replace any light bulbs as soon as they burn out.  Set up your furniture so you have a clear path. Avoid moving your furniture around.  If any of your floors are uneven, fix them.  If there are any pets around you, be aware of where they are.  Review your medicines with your doctor. Some medicines can make you feel dizzy. This can increase your chance of falling. Ask your doctor what other things that you can do to help prevent falls. This information is not intended to replace advice given to you by your health care provider. Make sure you discuss any questions you have with your health care provider. Document Released: 06/28/2009 Document Revised: 02/07/2016 Document Reviewed: 10/06/2014 Elsevier Interactive Patient Education  2017 [employer].

## 2019-09-19 DIAGNOSIS — Z9189 Other specified personal risk factors, not elsewhere classified: Secondary | ICD-10-CM | POA: Diagnosis not present

## 2019-09-19 DIAGNOSIS — Z20828 Contact with and (suspected) exposure to other viral communicable diseases: Secondary | ICD-10-CM | POA: Diagnosis not present

## 2019-09-26 DIAGNOSIS — Z20828 Contact with and (suspected) exposure to other viral communicable diseases: Secondary | ICD-10-CM | POA: Diagnosis not present

## 2019-09-26 DIAGNOSIS — Z9189 Other specified personal risk factors, not elsewhere classified: Secondary | ICD-10-CM | POA: Diagnosis not present

## 2019-10-03 DIAGNOSIS — Z9189 Other specified personal risk factors, not elsewhere classified: Secondary | ICD-10-CM | POA: Diagnosis not present

## 2019-10-03 DIAGNOSIS — Z20828 Contact with and (suspected) exposure to other viral communicable diseases: Secondary | ICD-10-CM | POA: Diagnosis not present

## 2019-10-10 DIAGNOSIS — Z9189 Other specified personal risk factors, not elsewhere classified: Secondary | ICD-10-CM | POA: Diagnosis not present

## 2019-10-10 DIAGNOSIS — Z20828 Contact with and (suspected) exposure to other viral communicable diseases: Secondary | ICD-10-CM | POA: Diagnosis not present

## 2019-10-17 DIAGNOSIS — Z9189 Other specified personal risk factors, not elsewhere classified: Secondary | ICD-10-CM | POA: Diagnosis not present

## 2019-10-17 DIAGNOSIS — Z20828 Contact with and (suspected) exposure to other viral communicable diseases: Secondary | ICD-10-CM | POA: Diagnosis not present

## 2019-11-15 DIAGNOSIS — Q6689 Other  specified congenital deformities of feet: Secondary | ICD-10-CM | POA: Diagnosis not present

## 2019-11-15 DIAGNOSIS — M79672 Pain in left foot: Secondary | ICD-10-CM | POA: Diagnosis not present

## 2019-11-15 DIAGNOSIS — B351 Tinea unguium: Secondary | ICD-10-CM | POA: Diagnosis not present

## 2019-11-15 DIAGNOSIS — M79671 Pain in right foot: Secondary | ICD-10-CM | POA: Diagnosis not present

## 2019-11-15 DIAGNOSIS — L84 Corns and callosities: Secondary | ICD-10-CM | POA: Diagnosis not present

## 2019-12-21 ENCOUNTER — Non-Acute Institutional Stay: Payer: Medicare HMO | Admitting: Internal Medicine

## 2019-12-21 ENCOUNTER — Other Ambulatory Visit: Payer: Self-pay

## 2019-12-21 ENCOUNTER — Encounter: Payer: Self-pay | Admitting: Internal Medicine

## 2019-12-21 VITALS — BP 110/58 | HR 60 | Temp 97.5°F | Ht 64.0 in | Wt 158.0 lb

## 2019-12-21 DIAGNOSIS — C4442 Squamous cell carcinoma of skin of scalp and neck: Secondary | ICD-10-CM | POA: Diagnosis not present

## 2019-12-21 DIAGNOSIS — I4892 Unspecified atrial flutter: Secondary | ICD-10-CM | POA: Diagnosis not present

## 2019-12-21 DIAGNOSIS — N184 Chronic kidney disease, stage 4 (severe): Secondary | ICD-10-CM | POA: Diagnosis not present

## 2019-12-21 DIAGNOSIS — I5022 Chronic systolic (congestive) heart failure: Secondary | ICD-10-CM

## 2019-12-21 DIAGNOSIS — G8929 Other chronic pain: Secondary | ICD-10-CM | POA: Diagnosis not present

## 2019-12-21 DIAGNOSIS — M25572 Pain in left ankle and joints of left foot: Secondary | ICD-10-CM | POA: Diagnosis not present

## 2019-12-21 DIAGNOSIS — R351 Nocturia: Secondary | ICD-10-CM | POA: Diagnosis not present

## 2019-12-21 DIAGNOSIS — E039 Hypothyroidism, unspecified: Secondary | ICD-10-CM | POA: Diagnosis not present

## 2019-12-21 NOTE — Progress Notes (Signed)
Location:  Occupational psychologist of Service:  Clinic (12)  Provider: Leomia Blake L. Mariea Clonts, D.O., C.M.D.  Code Status: DNR Goals of Care:  Advanced Directives 12/21/2019  Does Patient Have a Medical Advance Directive? Yes  Type of Paramedic of [place];Out of facility DNR (pink MOST or yellow form)  Does patient want to make changes to medical advance directive? No - Patient declined  Copy of [place] in Chart? Yes - validated most recent copy scanned in chart (See row information)  Would patient like information on creating a medical advance directive? -  Pre-existing out of facility DNR order (yellow form or pink MOST form) Pink MOST form placed in chart (order not valid for inpatient use)     Chief Complaint  Patient presents with  . Medical Management of Chronic Issues    4 month follow up     HPI: Patient is a 84 y.o. male seen today for medical management of chronic diseases.    [name] says [name] spends a lot of time in the bathroom urinating.  He has to go when they get to dinner in the dining room.  He has to go just as soon as he gets in the bed.  He takes aldactone daily and lasix qod.  He doesn't drink soda like he used to.    He has quite a bit of allergic rhinitis and uses a lot of tissues with the tree pollen.  Left hand is bothered by arthritis and on his foot.  He puts aspercreme on the left index finger.    He has sore spots on top of his head Past Medical History:  Diagnosis Date  . Arthritis   . Atrial fibrillation ([place])   . Atrial flutter ([place])   . Cardiomyopathy    Nonischemic, LVEF 20%  . CHF (congestive heart failure) ([place])   . Chronic renal insufficiency   . COPD (chronic obstructive pulmonary disease) ([place])   . Fracture of unspecified part of left clavicle, initial encounter for closed fracture   . Hypothyroidism   . Left ankle sprain   . Left bundle branch block   . Mixed hyperlipidemia    . Renal failure   . Scoliosis     Past Surgical History:  Procedure Laterality Date  . EYE SURGERY    . LAPAROSCOPIC CHOLECYSTECTOMY  2008  . left foot surgery  1998  . MASS EXCISION Left 06/26/2016   Procedure: EXCISION Mucoid tumor;  Surgeon: Daryll Brod, MD;  Location: [facility];  Service: Orthopedics;  Laterality: Left;  FAB  . Right inguinal herniorrhaphy    . ROTATION FLAP Left 06/26/2016   Procedure: debridement of distal interphalangeal possible, ROTATION FLAP left index finger;  Surgeon: Daryll Brod, MD;  Location: [facility];  Service: Orthopedics;  Laterality: Left;  . SKIN CANCER REMOVED  LEFT EAR    . TONSILLECTOMY      No Known Allergies  Outpatient Encounter Medications as of 12/21/2019  Medication Sig  . acetaminophen (TYLENOL) 325 MG tablet Take 650 mg by mouth every 6 (six) hours as needed.  [name] amiodarone (PACERONE) 200 MG tablet TAKE 1 TABLET BY MOUTH ONCE DAILY  . ammonium lactate (AMLACTIN) 12 % cream Apply topically as needed for dry skin.  [name] apixaban (ELIQUIS) 2.5 MG TABS tablet Take 1 tablet (2.5 mg total) by mouth 2 (two) times daily. Please cancel all previous orders for current medication. Change in dosage or pill size.  [name]  aspirin EC 81 MG tablet Take 81 mg by mouth daily.  . furosemide (LASIX) 40 MG tablet Take 1 tablet (40 mg total) by mouth every other day.  . hydrALAZINE (APRESOLINE) 25 MG tablet TAKE 1 TABLET BY MOUTH THREE TIMES DAILY  . hydrocortisone valerate cream (WESTCORT) 0.2 % Apply 1 application topically 2 (two) times daily.  . isosorbide mononitrate (IMDUR) 30 MG 24 hr tablet TAKE 1 TABLET BY MOUTH ONCE DAILY IN THE MORNING  . ketoconazole (NIZORAL) 2 % cream Apply 1 application topically daily.  [name] ketoconazole (NIZORAL) 2 % shampoo Apply 1 application topically 2 (two) times a week.  . levothyroxine (SYNTHROID, LEVOTHROID) 175 MCG tablet Take 175 mcg by mouth daily before breakfast.   . loratadine (CLARITIN) 10 MG tablet Take 10 mg by mouth daily.  [name]  losartan (COZAAR) 25 MG tablet TAKE 1 TABLET BY MOUTH ONCE DAILY  . spironolactone (ALDACTONE) 25 MG tablet TAKE ONE TABLET BY MOUTH ONCE DAILY   No facility-administered encounter medications on file as of 12/21/2019.    Review of Systems:  Review of Systems  Constitutional: Negative for chills, fever and malaise/fatigue.  HENT: Negative for congestion and hearing loss.   Eyes: Negative for blurred vision.       Glasses  Respiratory: Negative for cough and shortness of breath.   Cardiovascular: Negative for chest pain, palpitations, orthopnea, leg swelling and PND.  Gastrointestinal: Negative for abdominal pain, blood in stool, constipation, diarrhea and melena.  Genitourinary: Positive for frequency. Negative for dysuria and urgency.       Nocturia also  Musculoskeletal: Negative for falls and joint pain.  Neurological: Negative for dizziness and loss of consciousness.  Endo/Heme/Allergies: Bruises/bleeds easily.  Psychiatric/Behavioral: Negative for depression and memory loss. The patient is not nervous/anxious and does not have insomnia.     Health Maintenance  Topic Date Due  . INFLUENZA VACCINE  04/15/2020  . TETANUS/TDAP  03/06/2028  . PNA vac Low Risk Adult  Completed    Physical Exam: Vitals:   12/21/19 1410  BP: (!) 110/58  Pulse: 60  Temp: (!) 97.5 F (36.4 C)  TempSrc: Temporal  SpO2: 96%  Weight: 158 lb (71.7 kg)  Height: 5\' 4"  (1.626 m)   Body mass index is 27.12 kg/m. Physical Exam Vitals reviewed.  Constitutional:      Appearance: Normal appearance.  Eyes:     Comments: glasses  Cardiovascular:     Rate and Rhythm: Rhythm irregular.     Heart sounds: Murmur present.  Pulmonary:     Effort: Pulmonary effort is normal.     Breath sounds: Normal breath sounds. No rales.  Abdominal:     General: Bowel sounds are normal.     Palpations: Abdomen is soft.  Musculoskeletal:        General: Normal range of motion.     Right lower leg: No edema.      Left lower leg: No edema.     Comments: Walks with slow shuffle w/o assistive device  Skin:    General: Skin is warm and dry.     Coloration: Skin is pale.  Neurological:     General: No focal deficit present.     Mental Status: He is alert and oriented to person, place, and time.  Psychiatric:        Mood and Affect: Mood normal.     Labs reviewed: Basic Metabolic Panel: Recent Labs    05/03/19 0000 07/28/19 1226 07/28/19 1227 08/29/19 0530  NA 140  --  137  --   K 4.5  --  4.5  --   CL  --   --  103  --   CO2  --   --  26  --   GLUCOSE  --   --  88  --   BUN 40*  --  41*  --   CREATININE 2.4*  --  2.36*  --   CALCIUM  --   --  8.7*  --   TSH  --  1.587  --  1.90   Liver Function Tests: Recent Labs    05/03/19 0000 07/28/19 1227  AST 21 25  ALT 13 16  ALKPHOS 91 60  BILITOT  --  0.6  PROT  --  7.2  ALBUMIN  --  3.5   No results for input(s): LIPASE, AMYLASE in the last 8760 hours. No results for input(s): AMMONIA in the last 8760 hours. CBC: Recent Labs    05/03/19 0000 07/28/19 1227  WBC 6.4 7.1  HGB 13.6 13.3  HCT 40* 41.7  MCV  --  100.2*  PLT 191 221   Lipid Panel: Recent Labs    08/29/19 0000  CHOL 140  HDL 45  LDLCALC 69  TRIG 131   Lab Results  Component Value Date   HGBA1C 5.5 10/18/2017    Assessment/Plan 1. Chronic systolic heart failure (HCC) -continue aldactone and lasix as is -I don't imagine him tolerating less diuretic  2. CKD (chronic kidney disease) stage 4, GFR 15-29 ml/min (HCC) -Avoid nephrotoxic agents like nsaids, dose adjust renally excreted meds  3. Atrial flutter, unspecified type ([place]) -rate controlled and stable on amiodarone, eliquis anticoagulation  4. Acquired hypothyroidism -cont current levothyroxine Lab Results  Component Value Date   TSH 1.90 08/29/2019   5. Squamous cell carcinoma of skin of scalp -s/p mohs, has some new scaly places in proximity to prior excision  6. Chronic pain of left  ankle -from prior accident -cont tylenol as needed  7.  Nocturia more than twice per night -check PSA and UA c+s  -had distant benign nodule on prostate in 2013 -frequency has worsened recently w/o change in diuretic and is interfering with sleep -may try myrbetriq if PSA not remarkable and no signs of infection -would avoid flomax due to orthostatic hypotension risk and moderate fall risk already  Labs/tests ordered:  PSA, UA c+s, also check cbc, cmp, tsh  Next appt:  Visit date not found  Domenique Southers L. Johan Antonacci, D.O. [facility] [address], [place] [address] Cell Phone (Mon-Fri 8am-5pm):  [phone] On Call:  [phone] & follow prompts after 5pm & weekends Office Phone:  [phone] Office Fax:  [phone]

## 2019-12-22 DIAGNOSIS — D649 Anemia, unspecified: Secondary | ICD-10-CM | POA: Diagnosis not present

## 2019-12-22 DIAGNOSIS — R350 Frequency of micturition: Secondary | ICD-10-CM | POA: Diagnosis not present

## 2019-12-22 DIAGNOSIS — N390 Urinary tract infection, site not specified: Secondary | ICD-10-CM | POA: Diagnosis not present

## 2019-12-22 DIAGNOSIS — R319 Hematuria, unspecified: Secondary | ICD-10-CM | POA: Diagnosis not present

## 2019-12-22 DIAGNOSIS — N429 Disorder of prostate, unspecified: Secondary | ICD-10-CM | POA: Diagnosis not present

## 2019-12-22 DIAGNOSIS — E785 Hyperlipidemia, unspecified: Secondary | ICD-10-CM | POA: Diagnosis not present

## 2019-12-22 DIAGNOSIS — I10 Essential (primary) hypertension: Secondary | ICD-10-CM | POA: Diagnosis not present

## 2019-12-22 LAB — BASIC METABOLIC PANEL
BUN: 39 — AB (ref 4–21)
CO2: 25 — AB (ref 13–22)
Chloride: 101 (ref 99–108)
Creatinine: 2.7 — AB (ref 0.6–1.3)
Glucose: 81
Potassium: 4.8 (ref 3.4–5.3)
Sodium: 140 (ref 137–147)

## 2019-12-22 LAB — CBC: RBC: 4.06 (ref 3.87–5.11)

## 2019-12-22 LAB — COMPREHENSIVE METABOLIC PANEL
Albumin: 4 (ref 3.5–5.0)
Calcium: 8.6 — AB (ref 8.7–10.7)
Globulin: 3.1

## 2019-12-22 LAB — CBC AND DIFFERENTIAL
HCT: 39 — AB (ref 41–53)
Hemoglobin: 13.2 — AB (ref 13.5–17.5)
Platelets: 179 (ref 150–399)
WBC: 6.3

## 2019-12-22 LAB — PSA: PSA: 0.33

## 2019-12-28 ENCOUNTER — Encounter: Payer: Self-pay | Admitting: Internal Medicine

## 2019-12-29 ENCOUNTER — Encounter: Payer: Self-pay | Admitting: Internal Medicine

## 2020-01-04 ENCOUNTER — Encounter: Payer: Self-pay | Admitting: Internal Medicine

## 2020-02-29 DIAGNOSIS — L570 Actinic keratosis: Secondary | ICD-10-CM | POA: Diagnosis not present

## 2020-02-29 DIAGNOSIS — L814 Other melanin hyperpigmentation: Secondary | ICD-10-CM | POA: Diagnosis not present

## 2020-02-29 DIAGNOSIS — D1801 Hemangioma of skin and subcutaneous tissue: Secondary | ICD-10-CM | POA: Diagnosis not present

## 2020-02-29 DIAGNOSIS — D044 Carcinoma in situ of skin of scalp and neck: Secondary | ICD-10-CM | POA: Diagnosis not present

## 2020-04-30 DIAGNOSIS — Z9189 Other specified personal risk factors, not elsewhere classified: Secondary | ICD-10-CM | POA: Diagnosis not present

## 2020-04-30 DIAGNOSIS — Z20828 Contact with and (suspected) exposure to other viral communicable diseases: Secondary | ICD-10-CM | POA: Diagnosis not present

## 2020-05-07 DIAGNOSIS — Z20828 Contact with and (suspected) exposure to other viral communicable diseases: Secondary | ICD-10-CM | POA: Diagnosis not present

## 2020-05-07 DIAGNOSIS — Z9189 Other specified personal risk factors, not elsewhere classified: Secondary | ICD-10-CM | POA: Diagnosis not present

## 2020-06-14 ENCOUNTER — Encounter: Payer: Self-pay | Admitting: Internal Medicine

## 2020-06-14 DIAGNOSIS — E785 Hyperlipidemia, unspecified: Secondary | ICD-10-CM | POA: Diagnosis not present

## 2020-06-14 DIAGNOSIS — N429 Disorder of prostate, unspecified: Secondary | ICD-10-CM | POA: Diagnosis not present

## 2020-06-14 DIAGNOSIS — I10 Essential (primary) hypertension: Secondary | ICD-10-CM | POA: Diagnosis not present

## 2020-06-14 DIAGNOSIS — E039 Hypothyroidism, unspecified: Secondary | ICD-10-CM | POA: Diagnosis not present

## 2020-06-14 LAB — BASIC METABOLIC PANEL
BUN: 41 — AB (ref 4–21)
CO2: 24 — AB (ref 13–22)
Chloride: 101 (ref 99–108)
Creatinine: 2.2 — AB (ref 0.6–1.3)
Glucose: 102
Potassium: 4.7 (ref 3.4–5.3)
Sodium: 138 (ref 137–147)

## 2020-06-14 LAB — COMPREHENSIVE METABOLIC PANEL
Albumin: 4 (ref 3.5–5.0)
Calcium: 9 (ref 8.7–10.7)
Globulin: 2.9

## 2020-06-14 LAB — CBC AND DIFFERENTIAL
HCT: 38 — AB (ref 41–53)
Hemoglobin: 12.9 — AB (ref 13.5–17.5)
Platelets: 188 (ref 150–399)
WBC: 5.6

## 2020-06-14 LAB — HEPATIC FUNCTION PANEL
ALT: 12 (ref 10–40)
AST: 22 (ref 14–40)

## 2020-06-14 LAB — CBC: RBC: 3.92 (ref 3.87–5.11)

## 2020-06-14 LAB — TSH: TSH: 0.54 (ref 0.41–5.90)

## 2020-06-14 LAB — PSA: PSA: 0.4

## 2020-06-20 ENCOUNTER — Encounter: Payer: Self-pay | Admitting: Internal Medicine

## 2020-06-20 ENCOUNTER — Non-Acute Institutional Stay: Payer: Medicare HMO | Admitting: Internal Medicine

## 2020-06-20 ENCOUNTER — Other Ambulatory Visit: Payer: Self-pay

## 2020-06-20 VITALS — BP 112/58 | HR 71 | Temp 97.1°F | Ht 64.0 in | Wt 148.6 lb

## 2020-06-20 DIAGNOSIS — M25572 Pain in left ankle and joints of left foot: Secondary | ICD-10-CM | POA: Diagnosis not present

## 2020-06-20 DIAGNOSIS — R42 Dizziness and giddiness: Secondary | ICD-10-CM | POA: Diagnosis not present

## 2020-06-20 DIAGNOSIS — N184 Chronic kidney disease, stage 4 (severe): Secondary | ICD-10-CM

## 2020-06-20 DIAGNOSIS — R351 Nocturia: Secondary | ICD-10-CM | POA: Diagnosis not present

## 2020-06-20 DIAGNOSIS — G8929 Other chronic pain: Secondary | ICD-10-CM

## 2020-06-20 DIAGNOSIS — E039 Hypothyroidism, unspecified: Secondary | ICD-10-CM

## 2020-06-20 DIAGNOSIS — I5022 Chronic systolic (congestive) heart failure: Secondary | ICD-10-CM | POA: Diagnosis not present

## 2020-06-20 NOTE — Progress Notes (Signed)
Location:  Occupational psychologist of Service:  Clinic (12)  Provider: Jerae Izard L. Mariea Clonts, D.O., C.M.D.  Code Status: DNR Goals of Care:  Advanced Directives 06/20/2020  Does Patient Have a Medical Advance Directive? Yes  Type of Advance Directive Out of facility DNR (pink MOST or yellow form)  Does patient want to make changes to medical advance directive? No - Patient declined  Copy of [place] in Chart? -  Would patient like information on creating a medical advance directive? -  Pre-existing out of facility DNR order (yellow form or pink MOST form) Pink MOST/Yellow Form most recent copy in chart - Physician notified to receive inpatient order     Chief Complaint  Patient presents with  . Medical Management of Chronic Issues    6 month follow up   . Health Maintenance    Influenza ([place])    HPI: Patient is a 84 y.o. male seen today for medical management of chronic diseases.    He says he is blessed to be here at 22 almost 89 yrs.    He avoids going outside on account of his sinuses.    He went to cardiology, had an EKG--last visit was November of last year--he says he's supposed to go every 6 months..  If he gets up too quick, he will get dizzy like when he has to go to the bathroom.  Several family members have had strokes.    He has no complaints or things he wants help with today.  Labs reviewed and wnl.  Past Medical History:  Diagnosis Date  . Arthritis   . Atrial fibrillation ([place])   . Atrial flutter ([place])   . Cardiomyopathy    Nonischemic, LVEF 20%  . CHF (congestive heart failure) ([place])   . Chronic renal insufficiency   . COPD (chronic obstructive pulmonary disease) ([place])   . Fracture of unspecified part of left clavicle, initial encounter for closed fracture   . Hypothyroidism   . Left ankle sprain   . Left bundle branch block   . Mixed hyperlipidemia   . Renal failure   . Scoliosis     Past Surgical History:    Procedure Laterality Date  . EYE SURGERY    . LAPAROSCOPIC CHOLECYSTECTOMY  2008  . left foot surgery  1998  . MASS EXCISION Left 06/26/2016   Procedure: EXCISION Mucoid tumor;  Surgeon: Daryll Brod, MD;  Location: [facility];  Service: Orthopedics;  Laterality: Left;  FAB  . Right inguinal herniorrhaphy    . ROTATION FLAP Left 06/26/2016   Procedure: debridement of distal interphalangeal possible, ROTATION FLAP left index finger;  Surgeon: Daryll Brod, MD;  Location: [facility];  Service: Orthopedics;  Laterality: Left;  . SKIN CANCER REMOVED  LEFT EAR    . TONSILLECTOMY      No Known Allergies  Outpatient Encounter Medications as of 06/20/2020  Medication Sig  . acetaminophen (TYLENOL) 325 MG tablet Take 650 mg by mouth every 6 (six) hours as needed.  [name] amiodarone (PACERONE) 200 MG tablet TAKE 1 TABLET BY MOUTH ONCE DAILY  . ammonium lactate (AMLACTIN) 12 % cream Apply topically as needed for dry skin.  [name] apixaban (ELIQUIS) 2.5 MG TABS tablet Take 1 tablet (2.5 mg total) by mouth 2 (two) times daily. Please cancel all previous orders for current medication. Change in dosage or pill size.  [name] aspirin EC 81 MG tablet Take 81 mg by mouth daily.  . furosemide (  LASIX) 40 MG tablet Take 1 tablet (40 mg total) by mouth every other day.  . hydrALAZINE (APRESOLINE) 25 MG tablet TAKE 1 TABLET BY MOUTH THREE TIMES DAILY  . hydrocortisone ointment 0.5 % Apply 1 application topically 2 (two) times daily.  . isosorbide mononitrate (IMDUR) 30 MG 24 hr tablet TAKE 1 TABLET BY MOUTH ONCE DAILY IN THE MORNING  . ketoconazole (NIZORAL) 2 % cream Apply 1 application topically daily.  [name] levothyroxine (SYNTHROID, LEVOTHROID) 175 MCG tablet Take 175 mcg by mouth daily before breakfast.   . loratadine (CLARITIN) 10 MG tablet Take 10 mg by mouth daily.  [name] losartan (COZAAR) 25 MG tablet TAKE 1 TABLET BY MOUTH ONCE DAILY  . spironolactone (ALDACTONE) 25 MG tablet TAKE ONE TABLET BY MOUTH ONCE DAILY  . [DISCONTINUED]  hydrocortisone valerate cream (WESTCORT) 0.2 % Apply 1 application topically 2 (two) times daily.  . [DISCONTINUED] ketoconazole (NIZORAL) 2 % shampoo Apply 1 application topically 2 (two) times a week.   No facility-administered encounter medications on file as of 06/20/2020.    Review of Systems:  Review of Systems  Constitutional: Negative for chills, fever and malaise/fatigue.  HENT: Positive for congestion, hearing loss and sinus pain.   Eyes: Positive for blurred vision and double vision.       Glasses  Respiratory: Negative for cough and shortness of breath.   Cardiovascular: Negative for chest pain, palpitations and leg swelling.  Gastrointestinal: Negative for abdominal pain, blood in stool, constipation, diarrhea and melena.  Genitourinary: Positive for frequency and urgency. Negative for dysuria and hematuria.  Musculoskeletal: Positive for joint pain. Negative for falls.  Neurological: Positive for dizziness. Negative for loss of consciousness and weakness.  Psychiatric/Behavioral: Negative for depression. The patient is not nervous/anxious and does not have insomnia.     Health Maintenance  Topic Date Due  . INFLUENZA VACCINE  04/15/2020  . TETANUS/TDAP  03/06/2028  . COVID-19 Vaccine  Completed  . PNA vac Low Risk Adult  Completed    Physical Exam: Vitals:   06/20/20 1326  BP: (!) 112/58  Pulse: 71  Temp: (!) 97.1 F (36.2 C)  TempSrc: Temporal  SpO2: 98%  Weight: 148 lb 9.6 oz (67.4 kg)  Height: 5\' 4"  (1.626 m)   Body mass index is 25.51 kg/m. Physical Exam Vitals reviewed.  Constitutional:      General: He is not in acute distress.    Appearance: Normal appearance. He is not toxic-appearing.  HENT:     Head: Normocephalic and atraumatic.     Right Ear: External ear normal.     Left Ear: External ear normal.     Nose: Congestion present.  Cardiovascular:     Rate and Rhythm: Normal rate and regular rhythm.     Pulses: Normal pulses.     Heart  sounds: Normal heart sounds.  Pulmonary:     Effort: Pulmonary effort is normal.     Breath sounds: Normal breath sounds. No wheezing, rhonchi or rales.  Abdominal:     General: Bowel sounds are normal.  Musculoskeletal:     Cervical back: Neck supple.     Right lower leg: No edema.     Left lower leg: No edema.     Comments: Severe kyphosis; wearing compression stockings  Skin:    General: Skin is warm and dry.  Neurological:     Mental Status: He is alert. Mental status is at baseline.     Comments: Walks w/o assistive device; chronic dysarthric  speech--tells stories about family members and things he and Kitty do--difficulty to focus on medical concerns  Psychiatric:        Mood and Affect: Mood normal.        Behavior: Behavior normal.     Labs reviewed: Basic Metabolic Panel: Recent Labs    07/28/19 1226 07/28/19 1227 08/29/19 0530 12/22/19 0530 06/14/20 0000  NA  --  137  --  140 138  K  --  4.5  --  4.8 4.7  CL  --  103  --  101 101  CO2  --  26  --  25* 24*  GLUCOSE  --  88  --   --   --   BUN  --  41*  --  39* 41*  CREATININE  --  2.36*  --  2.7* 2.2*  CALCIUM  --  8.7*  --  8.6* 9.0  TSH 1.587  --  1.90  --  0.54   Liver Function Tests: Recent Labs    07/28/19 1227 12/22/19 0530 06/14/20 0000  AST 25  --  22  ALT 16  --  12  ALKPHOS 60  --   --   BILITOT 0.6  --   --   PROT 7.2  --   --   ALBUMIN 3.5 4.0 4.0   No results for input(s): LIPASE, AMYLASE in the last 8760 hours. No results for input(s): AMMONIA in the last 8760 hours. CBC: Recent Labs    07/28/19 1227 12/22/19 0530 06/14/20 0000  WBC 7.1 6.3 5.6  HGB 13.3 13.2* 12.9*  HCT 41.7 39* 38*  MCV 100.2*  --   --   PLT 221 179 188   Lipid Panel: Recent Labs    08/29/19 0000  CHOL 140  HDL 45  LDLCALC 69  TRIG 131   Lab Results  Component Value Date   HGBA1C 5.5 10/18/2017    Procedures since last visit: No results found.  Assessment/Plan 1. Chronic systolic heart  failure (HCC) -has some chronic dyspnea which is multifactorial with this, his sinus congestion/allergies and restrictive lung disease from kyphoscoliosis -stable on current regimen  2. CKD (chronic kidney disease) stage 4, GFR 15-29 ml/min (HCC) -stable, Avoid nephrotoxic agents like nsaids, dose adjust renally excreted meds, hydrate.  3. Acquired hypothyroidism -cont current levothyroxine, tsh wnl  4. Chronic pain of left ankle -stable, not c/o pain today vs usual  5. Nocturia more than twice per night -chronic, as well, PSA stable  6. Dizziness -has had some longstanding orthostatic dizziness, copes well and moves slowly  Labs/tests ordered:  No new, just reviewed current Next appt:   6 mos med mgt  Henny Strauch L. Darlyn Repsher, D.O. [facility] [address], [place] [address] Cell Phone (Mon-Fri 8am-5pm):  [phone] On Call:  [phone] & follow prompts after 5pm & weekends Office Phone:  [phone] Office Fax:  [phone]

## 2020-07-04 DIAGNOSIS — L814 Other melanin hyperpigmentation: Secondary | ICD-10-CM | POA: Diagnosis not present

## 2020-07-04 DIAGNOSIS — D1801 Hemangioma of skin and subcutaneous tissue: Secondary | ICD-10-CM | POA: Diagnosis not present

## 2020-07-04 DIAGNOSIS — L570 Actinic keratosis: Secondary | ICD-10-CM | POA: Diagnosis not present

## 2020-07-04 DIAGNOSIS — D044 Carcinoma in situ of skin of scalp and neck: Secondary | ICD-10-CM | POA: Diagnosis not present

## 2020-08-01 ENCOUNTER — Non-Acute Institutional Stay: Payer: Medicare HMO | Admitting: Internal Medicine

## 2020-08-01 ENCOUNTER — Other Ambulatory Visit: Payer: Self-pay

## 2020-08-01 ENCOUNTER — Encounter: Payer: Self-pay | Admitting: Internal Medicine

## 2020-08-01 VITALS — BP 130/64 | HR 57 | Temp 97.5°F | Ht 64.0 in | Wt 145.4 lb

## 2020-08-01 DIAGNOSIS — D044 Carcinoma in situ of skin of scalp and neck: Secondary | ICD-10-CM | POA: Diagnosis not present

## 2020-08-01 DIAGNOSIS — L814 Other melanin hyperpigmentation: Secondary | ICD-10-CM | POA: Diagnosis not present

## 2020-08-01 DIAGNOSIS — L570 Actinic keratosis: Secondary | ICD-10-CM | POA: Diagnosis not present

## 2020-08-01 DIAGNOSIS — D485 Neoplasm of uncertain behavior of skin: Secondary | ICD-10-CM | POA: Diagnosis not present

## 2020-08-01 DIAGNOSIS — L905 Scar conditions and fibrosis of skin: Secondary | ICD-10-CM | POA: Diagnosis not present

## 2020-08-01 DIAGNOSIS — L821 Other seborrheic keratosis: Secondary | ICD-10-CM | POA: Diagnosis not present

## 2020-08-01 DIAGNOSIS — I5022 Chronic systolic (congestive) heart failure: Secondary | ICD-10-CM | POA: Diagnosis not present

## 2020-08-01 NOTE — Progress Notes (Signed)
Location:  [place] of Service:  Clinic (12)  Provider: Danah Reinecke L. Mariea Clonts, D.O., C.M.D.  Code Status: DNR Goals of Care:  Advanced Directives 06/20/2020  Does Patient Have a Medical Advance Directive? Yes  Type of Advance Directive Out of facility DNR (pink MOST or yellow form)  Does patient want to make changes to medical advance directive? No - Patient declined  Copy of [place] in Chart? -  Would patient like information on creating a medical advance directive? -  Pre-existing out of facility DNR order (yellow form or pink MOST form) Pink MOST/Yellow Form most recent copy in chart - Physician notified to receive inpatient order     Chief Complaint  Patient presents with  . Acute Visit    Patient would like to discuss he up coming heart Dr. appointment on January 10th 2022    HPI: Patient is a 84 y.o. male seen today for an acute visit for discussing his heart.  No chest pains.  He's supposed to have an echocardiogram when he goes to cardiology again.  Pump function had gradaully improved.  He does get out of breath pushing Kitty in her transport chair (she had previously walked with her rollator).  PT told her not to push her anymore.    Sees Dr. Haroldine Laws Jan 10th at 11:20am.    Past Medical History:  Diagnosis Date  . Arthritis   . Atrial fibrillation ([place])   . Atrial flutter ([place])   . Cardiomyopathy    Nonischemic, LVEF 20%  . CHF (congestive heart failure) ([place])   . Chronic renal insufficiency   . COPD (chronic obstructive pulmonary disease) ([place])   . Fracture of unspecified part of left clavicle, initial encounter for closed fracture   . Hypothyroidism   . Left ankle sprain   . Left bundle branch block   . Mixed hyperlipidemia   . Renal failure   . Scoliosis     Past Surgical History:  Procedure Laterality Date  . EYE SURGERY    . LAPAROSCOPIC CHOLECYSTECTOMY  2008  . left foot surgery  1998  . MASS EXCISION Left 06/26/2016    Procedure: EXCISION Mucoid tumor;  Surgeon: Daryll Brod, MD;  Location: [facility];  Service: Orthopedics;  Laterality: Left;  FAB  . Right inguinal herniorrhaphy    . ROTATION FLAP Left 06/26/2016   Procedure: debridement of distal interphalangeal possible, ROTATION FLAP left index finger;  Surgeon: Daryll Brod, MD;  Location: [facility];  Service: Orthopedics;  Laterality: Left;  . SKIN CANCER REMOVED  LEFT EAR    . TONSILLECTOMY      No Known Allergies  Outpatient Encounter Medications as of 08/01/2020  Medication Sig  . amiodarone (PACERONE) 200 MG tablet TAKE 1 TABLET BY MOUTH ONCE DAILY  . ammonium lactate (AMLACTIN) 12 % cream Apply topically as needed for dry skin.  [name] apixaban (ELIQUIS) 2.5 MG TABS tablet Take 1 tablet (2.5 mg total) by mouth 2 (two) times daily. Please cancel all previous orders for current medication. Change in dosage or pill size.  [name] aspirin EC 81 MG tablet Take 81 mg by mouth daily.  . furosemide (LASIX) 40 MG tablet Take 1 tablet (40 mg total) by mouth every other day.  . hydrALAZINE (APRESOLINE) 25 MG tablet TAKE 1 TABLET BY MOUTH THREE TIMES DAILY  . hydrocortisone ointment 0.5 % Apply 1 application topically 2 (two) times daily.  . isosorbide mononitrate (IMDUR) 30 MG 24 hr tablet TAKE  1 TABLET BY MOUTH ONCE DAILY IN THE MORNING  . ketoconazole (NIZORAL) 2 % cream Apply 1 application topically daily.  [name] levothyroxine (SYNTHROID, LEVOTHROID) 175 MCG tablet Take 175 mcg by mouth daily before breakfast.   . loratadine (CLARITIN) 10 MG tablet Take 10 mg by mouth daily.  [name] losartan (COZAAR) 25 MG tablet TAKE 1 TABLET BY MOUTH ONCE DAILY  . spironolactone (ALDACTONE) 25 MG tablet TAKE ONE TABLET BY MOUTH ONCE DAILY  . [DISCONTINUED] acetaminophen (TYLENOL) 325 MG tablet Take 650 mg by mouth every 6 (six) hours as needed.   No facility-administered encounter medications on file as of 08/01/2020.    Review of Systems:  Review of Systems  Constitutional: Negative for  chills and fever.  HENT: Negative for congestion and sore throat.   Respiratory: Positive for shortness of breath. Negative for cough and wheezing.   Cardiovascular: Negative for chest pain, palpitations, orthopnea, leg swelling and PND.  Gastrointestinal: Negative for abdominal pain and constipation.  Genitourinary: Negative for dysuria.  Musculoskeletal: Negative for falls and joint pain.  Neurological: Negative for dizziness and loss of consciousness.  Endo/Heme/Allergies: Bruises/bleeds easily.  Psychiatric/Behavioral: Negative for depression and memory loss. The patient is not nervous/anxious and does not have insomnia.     Health Maintenance  Topic Date Due  . INFLUENZA VACCINE  04/15/2020  . TETANUS/TDAP  03/06/2028  . COVID-19 Vaccine  Completed  . PNA vac Low Risk Adult  Completed    Physical Exam: Vitals:   08/01/20 1508  BP: 130/64  Pulse: (!) 57  Temp: (!) 97.5 F (36.4 C)  TempSrc: Temporal  SpO2: 98%  Weight: 145 lb 6.4 oz (66 kg)  Height: 5\' 4"  (1.626 m)   Body mass index is 24.96 kg/m. Physical Exam Vitals reviewed.  Constitutional:      General: He is not in acute distress.    Appearance: He is not toxic-appearing.  Eyes:     Comments: glasses  Cardiovascular:     Rate and Rhythm: Normal rate and regular rhythm.     Heart sounds: No murmur heard.   Pulmonary:     Effort: Pulmonary effort is normal.     Breath sounds: Normal breath sounds. No rales.  Abdominal:     General: Bowel sounds are normal.     Palpations: Abdomen is soft.  Musculoskeletal:        General: Normal range of motion.     Right lower leg: No edema.     Left lower leg: No edema.     Comments: Severe kyphosis  Neurological:     Mental Status: He is alert. Mental status is at baseline.  Psychiatric:        Mood and Affect: Mood normal.     Labs reviewed: Basic Metabolic Panel: Recent Labs    08/29/19 0530 12/22/19 0530 06/14/20 0000  NA  --  140 138  K  --  4.8  4.7  CL  --  101 101  CO2  --  25* 24*  BUN  --  39* 41*  CREATININE  --  2.7* 2.2*  CALCIUM  --  8.6* 9.0  TSH 1.90  --  0.54   Liver Function Tests: Recent Labs    12/22/19 0530 06/14/20 0000  AST  --  22  ALT  --  12  ALBUMIN 4.0 4.0   No results for input(s): LIPASE, AMYLASE in the last 8760 hours. No results for input(s): AMMONIA in the last 8760 hours. CBC:  Recent Labs    12/22/19 0530 06/14/20 0000  WBC 6.3 5.6  HGB 13.2* 12.9*  HCT 39* 38*  PLT 179 188   Lipid Panel: Recent Labs    08/29/19 0000  CHOL 140  HDL 45  LDLCALC 69  TRIG 131   Lab Results  Component Value Date   HGBA1C 5.5 10/18/2017    Assessment/Plan 1. Chronic systolic heart failure (HCC) -appears stable -keep f/u with Dr. Haroldine Laws and echo as planned -no changes needed -I was not clear why appt today was made by staff for him b/c I saw him a few weeks ago   Labs/tests ordered:  No new added Next appt:  12/19/2020  Phylis Javed L. Royelle Hinchman, D.O. [facility] [address], [place] [address] Cell Phone (Mon-Fri 8am-5pm):  [phone] On Call:  [phone] & follow prompts after 5pm & weekends Office Phone:  [phone] Office Fax:  [phone]

## 2020-08-02 ENCOUNTER — Ambulatory Visit: Payer: Medicare HMO | Admitting: Internal Medicine

## 2020-08-29 DIAGNOSIS — L814 Other melanin hyperpigmentation: Secondary | ICD-10-CM | POA: Diagnosis not present

## 2020-08-29 DIAGNOSIS — L570 Actinic keratosis: Secondary | ICD-10-CM | POA: Diagnosis not present

## 2020-08-29 DIAGNOSIS — Z85828 Personal history of other malignant neoplasm of skin: Secondary | ICD-10-CM | POA: Diagnosis not present

## 2020-09-23 NOTE — Progress Notes (Signed)
ADVANCED HEART FAILURE CLINIC  Patient ID: [name], male   DOB: 03/23/1936, 85 y.o.   MRN: [id number]   ADVANCED HEART FAILURE NOTE PCP: Dr Willey Blade  HPI: Keian is a 85 y/o male with h/o cognitive impairment, renal insufficiency (baseline about 1.5-1.6), severe CHF due to NICM (EF 15-20%), atrial flutter (s/p DC-CV in April 2012) and LBBB. He and his family are not interested in ICD.   12/2010: Coronary angiograms showed insignifcant CAD.  12/2010: TEE-guided cardioversion EF o15-20%.  He was successfully cardioverted with 1 shock.  He was started on amiodarone to help maintain sinus rhythm.Given his LBBB we discussed the possibility of BiV-ICD but decided to defer as he was doing so well.  He returns today. Has been at [facility]. Doing well. Denies CP or SOB. Has been wearing compression stockings with good control of his LE edema. No problems with meds. .  Echo 11/20 EF 40-45%. AI appears mild to moderate visually. Pressure half time seems at least moderate   02/2011: ECHO with EF 79-02%, Grade 1 diastolic dysfunction, ventricular septum dyssynergy.  Mildly reduced RV systolic function.   06/28/12 ECHO EF 25-30% 11/2015: Echo EF 35%.   Labs (9/16): K 4.5 Creatinine 1.8 TC 144 TG 104 HDL 45 LDL 78 Labs (10/17) K 4.8, Creatinine 2.31.   ROS: All systems negative except as listed in HPI, PMH and Problem List.  Past Medical History:  Diagnosis Date  . Arthritis   . Atrial fibrillation ([place])   . Atrial flutter ([place])   . Cardiomyopathy    Nonischemic, LVEF 20%  . CHF (congestive heart failure) ([place])   . Chronic renal insufficiency   . COPD (chronic obstructive pulmonary disease) ([place])   . Fracture of unspecified part of left clavicle, initial encounter for closed fracture   . Hypothyroidism   . Left ankle sprain   . Left bundle branch block   . Mixed hyperlipidemia   . Renal failure   . Scoliosis     Current Outpatient Medications  Medication Sig  Dispense Refill  . amiodarone (PACERONE) 200 MG tablet TAKE 1 TABLET BY MOUTH ONCE DAILY 30 tablet 11  . ammonium lactate (AMLACTIN) 12 % cream Apply topically as needed for dry skin.    [name] apixaban (ELIQUIS) 2.5 MG TABS tablet Take 1 tablet (2.5 mg total) by mouth 2 (two) times daily. Please cancel all previous orders for current medication. Change in dosage or pill size. 30 tablet 6  . aspirin EC 81 MG tablet Take 81 mg by mouth daily.    . betamethasone dipropionate 0.05 % cream Apply topically 2 (two) times daily.    . carvedilol (COREG) 3.125 MG tablet Take 3.125 mg by mouth 2 (two) times daily with a meal.    . furosemide (LASIX) 40 MG tablet Take 1 tablet (40 mg total) by mouth every other day.    . hydrALAZINE (APRESOLINE) 25 MG tablet TAKE 1 TABLET BY MOUTH THREE TIMES DAILY 90 tablet 11  . isosorbide mononitrate (IMDUR) 30 MG 24 hr tablet TAKE 1 TABLET BY MOUTH ONCE DAILY IN THE MORNING 30 tablet 6  . levothyroxine (SYNTHROID, LEVOTHROID) 175 MCG tablet Take 175 mcg by mouth daily before breakfast.     . loratadine (CLARITIN) 10 MG tablet Take 10 mg by mouth daily.    [name] losartan (COZAAR) 25 MG tablet TAKE 1 TABLET BY MOUTH ONCE DAILY 30 tablet 5  . spironolactone (ALDACTONE) 25 MG tablet TAKE ONE TABLET BY MOUTH  ONCE DAILY 90 tablet 3   No current facility-administered medications for this encounter.     PHYSICAL EXAM: Vitals:   09/24/20 1124  BP: 118/70  Pulse: (!) 55  SpO2: 97%  Weight: 65.6 kg (144 lb 9.6 oz)   General:  Sitting in chair. No resp difficulty. + speech impediment HEENT: normal Neck: supple. no JVD. Carotids 2+ bilat; no bruits. No lymphadenopathy or thryomegaly appreciated. +severe kyphosis Cor: PMI nondisplaced. Regular rate & rhythm. Soft AI Lungs: clear Abdomen: soft, nontender, nondistended. No hepatosplenomegaly. No bruits or masses. Good bowel sounds. Extremities: no cyanosis, clubbing, rash, edema Neuro: alert & orientedx3, cranial nerves grossly  intact. moves all 4 extremities w/o difficulty. Affect pleasant  ECG: sinus brady 56 1 AVB (252ms) LBBB 110ms Personally reviewed  ASSESSMENT & PLAN:  1. Chronic systolic HF due to NICM - ECHO 2013  EF%. 25-30%. Cath no CAD 2013.  - Echo 11/20  EF 40-45% Mild to moderate AI  - Stable NYHA II - Volume status looks good.  - Off carvedilol with bradycardia - Continue hydralazine 25 mg tid/Imdur 30 mg daily  - Continue losartan 50 mg daily. (has not wanted to switch to [facility] due to cost). Need to watch renal function closely. May have to stop in near future.  - Continue Arlyce [place]. - Labs per PCP - Continue compression hose - Does not want ICD  2. AFL  - maintaining SR on amio 200 daily. On apixaban. No bleeding - With age >= 80 and Cr > 1.5 on apixaban 2.5 bid  3. CKD, stage IV  - creatinine stable at 2.6 - labs oer PCP  4. LBBB - stable  5. Bradycardia - has significant underlying conduction disease. Improved off carvedilol  - may need to consider PPM (CRT) at some point but does not meet criteria yet - no change  Glori Bickers, MD 9:40 PM

## 2020-09-24 ENCOUNTER — Other Ambulatory Visit: Payer: Self-pay

## 2020-09-24 ENCOUNTER — Ambulatory Visit (HOSPITAL_COMMUNITY)
Admission: RE | Admit: 2020-09-24 | Discharge: 2020-09-24 | Disposition: A | Discharge status: Home / Self Care | Payer: Medicare HMO | Source: Ambulatory Visit | Attending: Internal Medicine | Admitting: Internal Medicine

## 2020-09-24 ENCOUNTER — Encounter (HOSPITAL_COMMUNITY): Payer: Self-pay | Admitting: Internal Medicine

## 2020-09-24 VITALS — BP 118/70 | HR 55 | Wt 144.6 lb

## 2020-09-24 DIAGNOSIS — Z7982 Long term (current) use of aspirin: Secondary | ICD-10-CM | POA: Insufficient documentation

## 2020-09-24 DIAGNOSIS — I428 Other cardiomyopathies: Secondary | ICD-10-CM | POA: Diagnosis not present

## 2020-09-24 DIAGNOSIS — I130 Hypertensive heart and chronic kidney disease with heart failure and stage 1 through stage 4 chronic kidney disease, or unspecified chronic kidney disease: Secondary | ICD-10-CM | POA: Insufficient documentation

## 2020-09-24 DIAGNOSIS — I4892 Unspecified atrial flutter: Secondary | ICD-10-CM | POA: Insufficient documentation

## 2020-09-24 DIAGNOSIS — Z7901 Long term (current) use of anticoagulants: Secondary | ICD-10-CM | POA: Diagnosis not present

## 2020-09-24 DIAGNOSIS — J449 Chronic obstructive pulmonary disease, unspecified: Secondary | ICD-10-CM | POA: Diagnosis not present

## 2020-09-24 DIAGNOSIS — N184 Chronic kidney disease, stage 4 (severe): Secondary | ICD-10-CM | POA: Insufficient documentation

## 2020-09-24 DIAGNOSIS — R001 Bradycardia, unspecified: Secondary | ICD-10-CM | POA: Diagnosis not present

## 2020-09-24 DIAGNOSIS — I447 Left bundle-branch block, unspecified: Secondary | ICD-10-CM | POA: Insufficient documentation

## 2020-09-24 DIAGNOSIS — I5022 Chronic systolic (congestive) heart failure: Secondary | ICD-10-CM | POA: Diagnosis not present

## 2020-09-24 DIAGNOSIS — I2510 Atherosclerotic heart disease of native coronary artery without angina pectoris: Secondary | ICD-10-CM | POA: Insufficient documentation

## 2020-09-24 DIAGNOSIS — Z79899 Other long term (current) drug therapy: Secondary | ICD-10-CM | POA: Insufficient documentation

## 2020-09-24 DIAGNOSIS — I4891 Unspecified atrial fibrillation: Secondary | ICD-10-CM | POA: Diagnosis not present

## 2020-09-25 DIAGNOSIS — B351 Tinea unguium: Secondary | ICD-10-CM | POA: Diagnosis not present

## 2020-09-25 DIAGNOSIS — M79672 Pain in left foot: Secondary | ICD-10-CM | POA: Diagnosis not present

## 2020-09-25 DIAGNOSIS — M79671 Pain in right foot: Secondary | ICD-10-CM | POA: Diagnosis not present

## 2020-09-25 DIAGNOSIS — L84 Corns and callosities: Secondary | ICD-10-CM | POA: Diagnosis not present

## 2020-10-18 ENCOUNTER — Encounter: Payer: Self-pay | Admitting: Adult Health

## 2020-10-18 ENCOUNTER — Non-Acute Institutional Stay: Payer: Medicare HMO | Admitting: Adult Health

## 2020-10-18 DIAGNOSIS — R0602 Shortness of breath: Secondary | ICD-10-CM

## 2020-10-18 NOTE — Progress Notes (Signed)
Location:   [place] Room Number: 161-W Place of Service:  SNF [phone]) Provider:  Royal Hawthorn, NP    Patient Care Team: Gayland Curry, DO as PCP - General (Geriatric Medicine) Gayland Curry, DO as PCP - Internal Medicine (Geriatric Medicine) Royal Hawthorn, NP as Nurse Practitioner (Nurse Practitioner)  Extended Emergency Contact Information Primary Emergency Contact: Yakel,Roger Address: [address]          [place] [address] [name] of [place] Phone: [phone] Mobile Phone: [phone] Relation: Brother  Code Status:  DNR Goals of care: Advanced Directive information Advanced Directives 10/18/2020  Does Patient Have a Medical Advance Directive? Yes  Type of Advance Directive Out of facility DNR (pink MOST or yellow form)  Does patient want to make changes to medical advance directive? No - Patient declined  Copy of [place] in Chart? -  Would patient like information on creating a medical advance directive? -  Pre-existing out of facility DNR order (yellow form or pink MOST form) -     Chief Complaint  Patient presents with  . Acute Visit    Shortness of Breath.    HPI:  Pt is a 85 y.o. male seen today for an acute visit for SOB noticed at lunch time with dizziness.  Mr. Early reports he found a pill on the floor that he thought was his that he may have missed. His nurse assures me this is not the case. He then began to feel dizzy and short of breath, as well as anxious. The nurse checked his vitals signs and they were WNL. He denies any chest pain. After he sat down in his recliner he began to feel better. He had wanted to go to the ER but agreed to see me instead.   He has not had any progressive weight gain, edema, 02 dependency, or progressive dyspnea. He and his wife has some developmental delays and speech issues making it difficult to communicate.  Past Medical History:  Diagnosis Date  .  Arthritis   . Atrial fibrillation ([place])   . Atrial flutter ([place])   . Cardiomyopathy    Nonischemic, LVEF 20%  . CHF (congestive heart failure) ([place])   . Chronic renal insufficiency   . COPD (chronic obstructive pulmonary disease) ([place])   . Fracture of unspecified part of left clavicle, initial encounter for closed fracture   . Hypothyroidism   . Left ankle sprain   . Left bundle branch block   . Mixed hyperlipidemia   . Renal failure   . Scoliosis    Past Surgical History:  Procedure Laterality Date  . EYE SURGERY    . LAPAROSCOPIC CHOLECYSTECTOMY  2008  . left foot surgery  1998  . MASS EXCISION Left 06/26/2016   Procedure: EXCISION Mucoid tumor;  Surgeon: Daryll Brod, MD;  Location: [facility];  Service: Orthopedics;  Laterality: Left;  FAB  . Right inguinal herniorrhaphy    . ROTATION FLAP Left 06/26/2016   Procedure: debridement of distal interphalangeal possible, ROTATION FLAP left index finger;  Surgeon: Daryll Brod, MD;  Location: [facility];  Service: Orthopedics;  Laterality: Left;  . SKIN CANCER REMOVED  LEFT EAR    . TONSILLECTOMY      No Known Allergies  Allergies as of 10/18/2020   No Known Allergies     Medication List       Accurate as of October 18, 2020  3:17 PM. If you have any questions, ask  your nurse or doctor.        STOP taking these medications   carvedilol 3.125 MG tablet Commonly known as: COREG Stopped by: Royal Hawthorn, NP     TAKE these medications   amiodarone 200 MG tablet Commonly known as: PACERONE TAKE 1 TABLET BY MOUTH ONCE DAILY   ammonium lactate 12 % cream Commonly known as: AMLACTIN Apply topically as needed for dry skin.   apixaban 2.5 MG Tabs tablet Commonly known as: Eliquis Take 1 tablet (2.5 mg total) by mouth 2 (two) times daily. Please cancel all previous orders for current medication. Change in dosage or pill size.   aspirin EC 81 MG tablet Take 81 mg by mouth daily.   betamethasone dipropionate 0.05 % cream Apply  topically 2 (two) times daily.   furosemide 40 MG tablet Commonly known as: LASIX Take 1 tablet (40 mg total) by mouth every other day.   hydrALAZINE 25 MG tablet Commonly known as: APRESOLINE TAKE 1 TABLET BY MOUTH THREE TIMES DAILY   hydrocortisone valerate cream 0.2 % Commonly known as: WESTCORT Apply 1 application topically 2 (two) times daily.   isosorbide mononitrate 30 MG 24 hr tablet Commonly known as: IMDUR TAKE 1 TABLET BY MOUTH ONCE DAILY IN THE MORNING   ketoconazole 2 % cream Commonly known as: NIZORAL Apply 1 application topically 2 (two) times daily.   ketoconazole 2 % shampoo Commonly known as: NIZORAL Apply 1 application topically 2 (two) times a week.   levothyroxine 175 MCG tablet Commonly known as: SYNTHROID Take 175 mcg by mouth daily before breakfast.   loratadine 10 MG tablet Commonly known as: CLARITIN Take 10 mg by mouth daily.   losartan 25 MG tablet Commonly known as: COZAAR TAKE 1 TABLET BY MOUTH ONCE DAILY   spironolactone 25 MG tablet Commonly known as: ALDACTONE TAKE ONE TABLET BY MOUTH ONCE DAILY       Review of Systems  Constitutional: Negative for activity change, appetite change, chills, diaphoresis, fatigue, fever and unexpected weight change.  Respiratory: Positive for shortness of breath. Negative for cough, wheezing and stridor.   Cardiovascular: Negative for chest pain, palpitations and leg swelling.  Gastrointestinal: Negative for abdominal distention, abdominal pain, constipation and diarrhea.  Genitourinary: Negative for difficulty urinating and dysuria.  Musculoskeletal: Negative for arthralgias, back pain, gait problem, joint swelling and myalgias.  Neurological: Positive for dizziness. Negative for seizures, syncope, facial asymmetry, speech difficulty, weakness and headaches.  Hematological: Negative for adenopathy. Does not bruise/bleed easily.  Psychiatric/Behavioral: Negative for agitation, behavioral problems and  confusion.    Immunization History  Administered Date(s) Administered  . Influenza Inj Mdck Quad Pf 07/04/2016  . Influenza Inj Mdck Quad With Preservative 06/18/2017  . Influenza Split 06/02/2014, 06/02/2014, 06/14/2015  . Influenza, High Dose Seasonal PF 07/14/2019, 07/06/2020  . Influenza,inj,Quad PF,6+ Mos 07/15/2018  . Moderna Sars-Covid-2 Vaccination 09/25/2019, 10/25/2019, 07/28/2020  . Pneumococcal Conjugate-13 02/07/2016  . Pneumococcal Polysaccharide-23 03/09/2018  . Tdap 03/06/2018  . Zoster Recombinat (Shingrix) 03/26/2018   Pertinent  Health Maintenance Due  Topic Date Due  . INFLUENZA VACCINE  Completed  . PNA vac Low Risk Adult  Completed   Fall Risk  06/20/2020 12/21/2019 09/05/2019 08/31/2019 05/04/2019  Falls in the past year? 0 0 0 0 0  Number falls in past yr: 0 0 0 0 0  Injury with Fall? 0 0 0 0 0  Comment - - - - -  Risk for fall due to : - - Impaired vision - -  Follow up - - Falls evaluation completed - -   Functional Status Survey:    Vitals:   10/18/20 1511  BP: (!) 159/61  Pulse: 69  Resp: 20  Temp: 97.9 F (36.6 C)  SpO2: 96%  Weight: 142 lb 6.4 oz (64.6 kg)  Height: 5\' 4"  (1.626 m)   Body mass index is 24.44 kg/m. Physical Exam Vitals and nursing note reviewed.  Constitutional:      General: He is not in acute distress.    Appearance: He is not diaphoretic.  HENT:     Head: Normocephalic and atraumatic.  Neck:     Thyroid: No thyromegaly.     Vascular: No JVD.     Trachea: No tracheal deviation.  Cardiovascular:     Rate and Rhythm: Normal rate and regular rhythm.     Heart sounds: No murmur heard.   Pulmonary:     Effort: Pulmonary effort is normal. No respiratory distress.     Breath sounds: Normal breath sounds. No wheezing.  Abdominal:     General: Bowel sounds are normal. There is no distension.     Palpations: Abdomen is soft.     Tenderness: There is no abdominal tenderness.  Musculoskeletal:     Cervical back: Neck  supple.     Right lower leg: No edema.     Left lower leg: No edema.     Comments: Severe kyphosis  Lymphadenopathy:     Cervical: No cervical adenopathy.  Skin:    General: Skin is warm and dry.  Neurological:     Mental Status: He is alert and oriented to person, place, and time.     Cranial Nerves: No cranial nerve deficit.  Psychiatric:        Mood and Affect: Mood and affect normal.     Labs reviewed: Recent Labs    12/22/19 0530 06/14/20 0000  NA 140 138  K 4.8 4.7  CL 101 101  CO2 25* 24*  BUN 39* 41*  CREATININE 2.7* 2.2*  CALCIUM 8.6* 9.0   Recent Labs    12/22/19 0530 06/14/20 0000  AST  --  22  ALT  --  12  ALBUMIN 4.0 4.0   Recent Labs    12/22/19 0530 06/14/20 0000  WBC 6.3 5.6  HGB 13.2* 12.9*  HCT 39* 38*  PLT 179 188   Lab Results  Component Value Date   TSH 0.54 06/14/2020   Lab Results  Component Value Date   HGBA1C 5.5 10/18/2017   Lab Results  Component Value Date   CHOL 140 08/29/2019   HDL 45 08/29/2019   LDLCALC 69 08/29/2019   TRIG 131 08/29/2019   CHOLHDL 3.4 10/18/2017    Significant Diagnostic Results in last 30 days:  No results found.  Assessment/Plan  1. Shortness of breath I suspect he is experiencing some anxiety rather than an acute episode of CHF based on my exam which did not reveal any s/s of CHF. I will order CXR to bring peace of mind to Mr. Broecker and rule out infection or other acute process. The nurses will monitor his vital signs and provide reassurance. If this occurs again he may benefit from medication for an anxiety.  Family/ staff Communication:   Labs/tests ordered:  CXR

## 2020-10-19 ENCOUNTER — Encounter: Payer: Self-pay | Admitting: Adult Health

## 2020-10-31 DIAGNOSIS — L821 Other seborrheic keratosis: Secondary | ICD-10-CM | POA: Diagnosis not present

## 2020-10-31 DIAGNOSIS — L814 Other melanin hyperpigmentation: Secondary | ICD-10-CM | POA: Diagnosis not present

## 2020-10-31 DIAGNOSIS — L570 Actinic keratosis: Secondary | ICD-10-CM | POA: Diagnosis not present

## 2020-10-31 DIAGNOSIS — Z85828 Personal history of other malignant neoplasm of skin: Secondary | ICD-10-CM | POA: Diagnosis not present

## 2020-11-05 ENCOUNTER — Encounter: Payer: Self-pay | Admitting: Internal Medicine

## 2020-11-28 DIAGNOSIS — L570 Actinic keratosis: Secondary | ICD-10-CM | POA: Diagnosis not present

## 2020-11-28 DIAGNOSIS — Z85828 Personal history of other malignant neoplasm of skin: Secondary | ICD-10-CM | POA: Diagnosis not present

## 2020-11-28 DIAGNOSIS — L814 Other melanin hyperpigmentation: Secondary | ICD-10-CM | POA: Diagnosis not present

## 2020-12-19 ENCOUNTER — Encounter: Payer: Self-pay | Admitting: Internal Medicine

## 2021-01-02 ENCOUNTER — Other Ambulatory Visit: Payer: Self-pay

## 2021-01-02 ENCOUNTER — Non-Acute Institutional Stay: Payer: Medicare HMO | Admitting: Internal Medicine

## 2021-01-02 ENCOUNTER — Encounter: Payer: Self-pay | Admitting: Internal Medicine

## 2021-01-02 VITALS — BP 122/50 | HR 60 | Temp 96.8°F | Ht 64.0 in | Wt 138.0 lb

## 2021-01-02 DIAGNOSIS — I4892 Unspecified atrial flutter: Secondary | ICD-10-CM

## 2021-01-02 DIAGNOSIS — H6123 Impacted cerumen, bilateral: Secondary | ICD-10-CM | POA: Diagnosis not present

## 2021-01-02 DIAGNOSIS — L570 Actinic keratosis: Secondary | ICD-10-CM | POA: Diagnosis not present

## 2021-01-02 DIAGNOSIS — I5022 Chronic systolic (congestive) heart failure: Secondary | ICD-10-CM

## 2021-01-02 DIAGNOSIS — L814 Other melanin hyperpigmentation: Secondary | ICD-10-CM | POA: Diagnosis not present

## 2021-01-02 DIAGNOSIS — N184 Chronic kidney disease, stage 4 (severe): Secondary | ICD-10-CM | POA: Diagnosis not present

## 2021-01-02 DIAGNOSIS — E039 Hypothyroidism, unspecified: Secondary | ICD-10-CM

## 2021-01-02 DIAGNOSIS — Z85828 Personal history of other malignant neoplasm of skin: Secondary | ICD-10-CM | POA: Diagnosis not present

## 2021-01-02 NOTE — Progress Notes (Signed)
Location:  [place] of Service:  Clinic (12)  Provider:   Code Status:  Goals of Care:  Advanced Directives 10/18/2020  Does Patient Have a Medical Advance Directive? Yes  Type of Advance Directive Out of facility DNR (pink MOST or yellow form)  Does patient want to make changes to medical advance directive? No - Patient declined  Copy of [place] in Chart? -  Would patient like information on creating a medical advance directive? -  Pre-existing out of facility DNR order (yellow form or pink MOST form) -     Chief Complaint  Patient presents with  . Medical Management of Chronic Issues    Patient returns to the clinic for his 6 month follow up.    HPI: Patient is a 85 y.o. male seen today for medical management of chronic diseases.  Patient lives in [place] with his wife He has a history of chronic systolic heart failure last EF was 45% which is much improved.  Follows with Dr. Haroldine Laws Also has a history of bradycardia, Worsened on the Coreg. History of atrial flutter on amiodarone and Eliquis History of hypothyroidism Also history of CKD stage IV Speech impairment Poor Vision Skin Cancer on Scalp s/p [facility] Surgery  Did not have any acute issues Sometimes very hard to understand Walks with no assists. NO SOB or Cough  Past Medical History:  Diagnosis Date  . Arthritis   . Atrial fibrillation ([place])   . Atrial flutter ([place])   . Cardiomyopathy    Nonischemic, LVEF 20%  . CHF (congestive heart failure) ([place])   . Chronic renal insufficiency   . COPD (chronic obstructive pulmonary disease) ([place])   . Fracture of unspecified part of left clavicle, initial encounter for closed fracture   . Hypothyroidism   . Left ankle sprain   . Left bundle branch block   . Mixed hyperlipidemia   . Renal failure   . Scoliosis     Past Surgical History:  Procedure Laterality Date  . EYE SURGERY    . LAPAROSCOPIC CHOLECYSTECTOMY   2008  . left foot surgery  1998  . MASS EXCISION Left 06/26/2016   Procedure: EXCISION Mucoid tumor;  Surgeon: Daryll Brod, MD;  Location: [facility];  Service: Orthopedics;  Laterality: Left;  FAB  . Right inguinal herniorrhaphy    . ROTATION FLAP Left 06/26/2016   Procedure: debridement of distal interphalangeal possible, ROTATION FLAP left index finger;  Surgeon: Daryll Brod, MD;  Location: [facility];  Service: Orthopedics;  Laterality: Left;  . SKIN CANCER REMOVED  LEFT EAR    . TONSILLECTOMY      No Known Allergies  Outpatient Encounter Medications as of 01/02/2021  Medication Sig  . amiodarone (PACERONE) 200 MG tablet TAKE 1 TABLET BY MOUTH ONCE DAILY  . ammonium lactate (AMLACTIN) 12 % cream Apply topically as needed for dry skin.  [name] apixaban (ELIQUIS) 2.5 MG TABS tablet Take 1 tablet (2.5 mg total) by mouth 2 (two) times daily. Please cancel all previous orders for current medication. Change in dosage or pill size.  [name] aspirin EC 81 MG tablet Take 81 mg by mouth daily.  . betamethasone dipropionate 0.05 % cream Apply topically 2 (two) times daily.  . furosemide (LASIX) 40 MG tablet Take 1 tablet (40 mg total) by mouth every other day.  . hydrALAZINE (APRESOLINE) 25 MG tablet TAKE 1 TABLET BY MOUTH THREE TIMES DAILY  . hydrocortisone valerate cream (WESTCORT) 0.2 %  Apply 1 application topically 2 (two) times daily.  . isosorbide mononitrate (IMDUR) 30 MG 24 hr tablet TAKE 1 TABLET BY MOUTH ONCE DAILY IN THE MORNING  . ketoconazole (NIZORAL) 2 % cream Apply 1 application topically 2 (two) times daily.  [name] ketoconazole (NIZORAL) 2 % shampoo Apply 1 application topically 2 (two) times a week.  . levothyroxine (SYNTHROID, LEVOTHROID) 175 MCG tablet Take 175 mcg by mouth daily before breakfast.   . loratadine (CLARITIN) 10 MG tablet Take 10 mg by mouth daily.  [name] losartan (COZAAR) 25 MG tablet TAKE 1 TABLET BY MOUTH ONCE DAILY  . polyethylene glycol (MIRALAX / GLYCOLAX) 17 g packet Take 17 g by mouth  daily as needed.  [name] spironolactone (ALDACTONE) 25 MG tablet TAKE ONE TABLET BY MOUTH ONCE DAILY   No facility-administered encounter medications on file as of 01/02/2021.    Review of Systems:  Review of Systems  Review of Systems  Constitutional: Negative for activity change, appetite change, chills, diaphoresis, fatigue and fever.  HENT: Negative for mouth sores,Positive for  postnasal drip, rhinorrhea,  Respiratory: Negative for apnea, cough, chest tightness, shortness of breath and wheezing.   Cardiovascular: Negative for chest pain, palpitations .  Gastrointestinal: Negative for abdominal distention, abdominal pain, constipation, diarrhea, nausea and vomiting.  Genitourinary: Negative for dysuria and frequency.  Musculoskeletal: Negative for arthralgias, joint swelling and myalgias.  Skin: Negative for rash.  Neurological: Negative for dizziness, syncope, weakness, light-headedness and numbness.  Psychiatric/Behavioral: Negative for behavioral problems, confusion and sleep disturbance.     Health Maintenance  Topic Date Due  . COVID-19 Vaccine (4 - Booster for Moderna series) 01/25/2021  . INFLUENZA VACCINE  04/15/2021  . TETANUS/TDAP  03/06/2028  . PNA vac Low Risk Adult  Completed  . HPV VACCINES  Aged Out    Physical Exam: Vitals:   01/02/21 0912  BP: (!) 122/50  Pulse: 60  Temp: (!) 96.8 F (36 C)  SpO2: 95%  Weight: 138 lb (62.6 kg)  Height: 5\' 4"  (1.626 m)   Body mass index is 23.69 kg/m. Physical Exam Constitutional: Oriented to person, place, and time. Well-developed and well-nourished.  HENT:  Head: Normocephalic.  Ears Wax in both ears Mouth/Throat: Oropharynx is clear and moist.  Eyes: Pupils are equal, round, and reactive to light.  Neck: Neck supple.  Cardiovascular: Normal rate and normal heart sounds.  No murmur heard. Pulmonary/Chest: Effort normal and breath sounds normal. No respiratory distress. No wheezes. She has no rales.  Abdominal:  Soft. Bowel sounds are normal. No distension. There is no tenderness. There is no rebound.  Musculoskeletal: Edema Bilateral Left more then right  Lymphadenopathy: none Neurological: Alert and oriented to person, place, and time. Walks with no assists Skin: Skin is warm and dry.  Psychiatric: Normal mood and affect. Behavior is normal. Thought content normal.   Labs reviewed: Basic Metabolic Panel: Recent Labs    06/14/20 0000  NA 138  K 4.7  CL 101  CO2 24*  BUN 41*  CREATININE 2.2*  CALCIUM 9.0  TSH 0.54   Liver Function Tests: Recent Labs    06/14/20 0000  AST 22  ALT 12  ALBUMIN 4.0   No results for input(s): LIPASE, AMYLASE in the last 8760 hours. No results for input(s): AMMONIA in the last 8760 hours. CBC: Recent Labs    06/14/20 0000  WBC 5.6  HGB 12.9*  HCT 38*  PLT 188   Lipid Panel: No results for input(s): CHOL, HDL, LDLCALC,  TRIG, CHOLHDL, LDLDIRECT in the last 8760 hours. Lab Results  Component Value Date   HGBA1C 5.5 10/18/2017    Procedures since last visit: No results found.  Assessment/Plan 1. Chronic systolic heart failure (HCC) LAST EF was 40-45% with Moderate AI On Hydralazine and Imdur Also on Spirolactone and Lasix Uses Ted hoses for edema  2. CKD (chronic kidney disease) stage 4, GFR 15-29 ml/min (HCC) Repeat BMP ? Need Renal Referal if any worsening  3. Acquired hypothyroidism Repeat TSH  4. Atrial flutter, unspecified type [facility]) Follows with Cardiology Stays on Sinus Rhythm On Amiodarone and Eliquis  5. Bilateral impacted cerumen Orders written for Ear wash 6 Bradycardia Due to Conduction disease Has improved off coreg 7 Vision Impairment Refer to Ophthalmology in [place]  Labs/tests ordered:  [facility] Next appt:  04/08/2021

## 2021-01-03 DIAGNOSIS — E785 Hyperlipidemia, unspecified: Secondary | ICD-10-CM | POA: Diagnosis not present

## 2021-01-03 LAB — LIPID PANEL
Cholesterol: 171 (ref 0–200)
HDL: 59 (ref 35–70)
LDL Cholesterol: 92
LDl/HDL Ratio: 2.9
Triglycerides: 102 (ref 40–160)

## 2021-01-03 LAB — CBC AND DIFFERENTIAL
HCT: 39 — AB (ref 41–53)
Hemoglobin: 12.9 — AB (ref 13.5–17.5)
Platelets: 234 (ref 150–399)
WBC: 5.7

## 2021-01-03 LAB — HEPATIC FUNCTION PANEL
ALT: 15 (ref 10–40)
AST: 22 (ref 14–40)
Alkaline Phosphatase: 83 (ref 25–125)
Bilirubin, Total: 0.4

## 2021-01-03 LAB — BASIC METABOLIC PANEL
BUN: 44 — AB (ref 4–21)
CO2: 27 — AB (ref 13–22)
Chloride: 97 — AB (ref 99–108)
Creatinine: 2.4 — AB (ref 0.6–1.3)
Glucose: 93
Potassium: 4.7 (ref 3.4–5.3)
Sodium: 134 — AB (ref 137–147)

## 2021-01-03 LAB — TSH: TSH: 0.2 — AB (ref 0.41–5.90)

## 2021-01-03 LAB — COMPREHENSIVE METABOLIC PANEL
Albumin: 4 (ref 3.5–5.0)
Calcium: 9.3 (ref 8.7–10.7)
Globulin: 3

## 2021-01-03 LAB — CBC: RBC: 3.97 (ref 3.87–5.11)

## 2021-02-20 DIAGNOSIS — H53002 Unspecified amblyopia, left eye: Secondary | ICD-10-CM | POA: Diagnosis not present

## 2021-02-20 DIAGNOSIS — H52203 Unspecified astigmatism, bilateral: Secondary | ICD-10-CM | POA: Diagnosis not present

## 2021-02-20 DIAGNOSIS — H5203 Hypermetropia, bilateral: Secondary | ICD-10-CM | POA: Diagnosis not present

## 2021-02-20 DIAGNOSIS — H2513 Age-related nuclear cataract, bilateral: Secondary | ICD-10-CM | POA: Diagnosis not present

## 2021-02-28 DIAGNOSIS — E039 Hypothyroidism, unspecified: Secondary | ICD-10-CM | POA: Diagnosis not present

## 2021-02-28 LAB — TSH: TSH: 1.29 (ref 0.41–5.90)

## 2021-03-04 ENCOUNTER — Other Ambulatory Visit: Payer: Self-pay

## 2021-03-04 ENCOUNTER — Ambulatory Visit (HOSPITAL_COMMUNITY)
Admission: RE | Admit: 2021-03-04 | Discharge: 2021-03-04 | Disposition: A | Discharge status: Home / Self Care | Payer: Medicare HMO | Source: Ambulatory Visit | Attending: Internal Medicine | Admitting: Internal Medicine

## 2021-03-04 ENCOUNTER — Encounter (HOSPITAL_COMMUNITY): Payer: Self-pay | Admitting: Internal Medicine

## 2021-03-04 VITALS — BP 147/52 | HR 50 | Wt 139.0 lb

## 2021-03-04 DIAGNOSIS — N184 Chronic kidney disease, stage 4 (severe): Secondary | ICD-10-CM

## 2021-03-04 DIAGNOSIS — Z7901 Long term (current) use of anticoagulants: Secondary | ICD-10-CM | POA: Insufficient documentation

## 2021-03-04 DIAGNOSIS — Z66 Do not resuscitate: Secondary | ICD-10-CM | POA: Diagnosis not present

## 2021-03-04 DIAGNOSIS — Z79890 Hormone replacement therapy (postmenopausal): Secondary | ICD-10-CM | POA: Diagnosis not present

## 2021-03-04 DIAGNOSIS — R001 Bradycardia, unspecified: Secondary | ICD-10-CM | POA: Diagnosis not present

## 2021-03-04 DIAGNOSIS — I4892 Unspecified atrial flutter: Secondary | ICD-10-CM | POA: Insufficient documentation

## 2021-03-04 DIAGNOSIS — I428 Other cardiomyopathies: Secondary | ICD-10-CM | POA: Diagnosis not present

## 2021-03-04 DIAGNOSIS — I447 Left bundle-branch block, unspecified: Secondary | ICD-10-CM | POA: Diagnosis not present

## 2021-03-04 DIAGNOSIS — Z79899 Other long term (current) drug therapy: Secondary | ICD-10-CM | POA: Insufficient documentation

## 2021-03-04 DIAGNOSIS — I5022 Chronic systolic (congestive) heart failure: Secondary | ICD-10-CM

## 2021-03-04 DIAGNOSIS — Z7982 Long term (current) use of aspirin: Secondary | ICD-10-CM | POA: Diagnosis not present

## 2021-03-04 NOTE — Patient Instructions (Signed)
Your physician recommends that you schedule a follow-up appointment in: 6-9 months with echocardiogram

## 2021-03-04 NOTE — Progress Notes (Signed)
ADVANCED HEART FAILURE CLINIC  Patient ID: [name], male   DOB: [birth year], 85 y.o.   MRN: [id number]   ADVANCED HEART FAILURE NOTE PCP: Dr Willey Blade  HPI: [name] is a 85 y/o male with h/o cognitive impairment, renal insufficiency (baseline about 1.5-1.6), severe CHF due to NICM (EF 15-20%), atrial flutter (s/p DC-CV in April 2012) and LBBB. He and his family are not interested in ICD.   12/2010: Coronary angiograms showed insignifcant CAD.  12/2010: TEE-guided cardioversion EF 15-20%.  He was successfully cardioverted with 1 shock.  He was started on amiodarone to help maintain sinus rhythm.Given his LBBB we discussed the possibility of BiV-ICD but decided to defer as he was doing so well.  He returns today. Has been at [facility]. Says he is doing well. Denies CP or SOB. No orthopnea or PND. Volume status well controlled with compression hose. No syncope/presyncope  Recent labs 01/03/21 reviewed K 4.7 Cr 2.4 (stable)  Echo 11/20 EF 40-45%. AI appears mild to moderate visually. Pressure half time seems at least moderate   02/2011: ECHO with EF 17-61%, Grade 1 diastolic dysfunction, ventricular septum dyssynergy.  Mildly reduced RV systolic function.   06/28/12 ECHO EF 25-30% 11/2015: Echo EF 35%.    ROS: All systems negative except as listed in HPI, PMH and Problem List.  Past Medical History:  Diagnosis Date   Arthritis    Atrial fibrillation (HCC)    Atrial flutter (HCC)    Cardiomyopathy    Nonischemic, LVEF 20%   CHF (congestive heart failure) (HCC)    Chronic renal insufficiency    COPD (chronic obstructive pulmonary disease) (HCC)    Fracture of unspecified part of left clavicle, initial encounter for closed fracture    Hypothyroidism    Left ankle sprain    Left bundle branch block    Mixed hyperlipidemia    Renal failure    Scoliosis     Current Outpatient Medications  Medication Sig Dispense Refill   amiodarone (PACERONE) 200 MG tablet TAKE  1 TABLET BY MOUTH ONCE DAILY 30 tablet 11   ammonium lactate (AMLACTIN) 12 % cream Apply topically as needed for dry skin.     apixaban (ELIQUIS) 2.5 MG TABS tablet Take 1 tablet (2.5 mg total) by mouth 2 (two) times daily. Please cancel all previous orders for current medication. Change in dosage or pill size. 30 tablet 6   aspirin EC 81 MG tablet Take 81 mg by mouth daily.     furosemide (LASIX) 40 MG tablet Take 40 mg by mouth daily.     hydrALAZINE (APRESOLINE) 25 MG tablet TAKE 1 TABLET BY MOUTH THREE TIMES DAILY 90 tablet 11   hydrocortisone ointment 0.5 % Apply 1 application topically 2 (two) times daily as needed for itching. Right thumb     hydrocortisone valerate cream (WESTCORT) 0.2 % Apply 1 application topically 2 (two) times daily.     isosorbide mononitrate (IMDUR) 30 MG 24 hr tablet TAKE 1 TABLET BY MOUTH ONCE DAILY IN THE MORNING 30 tablet 6   ketoconazole (NIZORAL) 2 % cream Apply 1 application topically 2 (two) times daily.     ketoconazole (NIZORAL) 2 % shampoo Apply 1 application topically 2 (two) times a week.     levothyroxine (SYNTHROID) 150 MCG tablet Take 150 mcg by mouth daily before breakfast.     loratadine (CLARITIN) 10 MG tablet Take 10 mg by mouth daily.     losartan (COZAAR) 25 MG tablet TAKE 1  TABLET BY MOUTH ONCE DAILY 30 tablet 5   polyethylene glycol (MIRALAX / GLYCOLAX) 17 g packet Take 17 g by mouth daily as needed.     spironolactone (ALDACTONE) 25 MG tablet TAKE ONE TABLET BY MOUTH ONCE DAILY 90 tablet 3   No current facility-administered medications for this encounter.     PHYSICAL EXAM: Vitals:   03/04/21 1143  BP: (!) 147/52  Pulse: (!) 50  SpO2: 97%  Weight: 63 kg (139 lb)    General:  Sitting in chair. No resp difficulty. + speech impediment HEENT: normal Neck: severe kyphosis  no JVD. Carotids 2+ bilat; no bruits. No lymphadenopathy or thryomegaly appreciated Cor: PMI nondisplaced. Regular brady  No rubs, gallops or murmurs. Lungs:  clear Abdomen: soft, nontender, nondistended. No hepatosplenomegaly. No bruits or masses. Good bowel sounds. Extremities: no cyanosis, clubbing, rash, edema Neuro: alert & orientedx3, cranial nerves grossly intact. moves all 4 extremities w/o difficulty. Affect pleasant  ECG: sinus brady 49 1 AVB (257ms) LBBB 130ms Personally reviewed  ASSESSMENT & PLAN:  1. Chronic systolic HF due to NICM - ECHO 2013  EF%. 25-30%. Cath no CAD 2013.  - Echo 11/20  EF 40-45% Mild to moderate AI  - Doing well. Stable NYHA II - Volume status looks good - Off carvedilol with bradycardia - Continue hydralazine 25 mg tid/Imdur 30 mg daily  - Continue losartan 50 mg daily. (has not wanted to switch to [facility] due to cost). Need to watch renal function closely. Has been stable in 2.2-2.4 range - Continue Spiro. - Recent labs reviewed and are ok  - Continue compression hose - Does not want ICD - repeat echo at next visit  2. AFL  - maintaining SR on amio 200 daily. On apixaban. No bleeding - With age >= 80 and Cr > 1.5 on apixaban 2.5 bid - No change  3. CKD, stage IV - recent labs 4/22 reviewed K 4.7 Scr 2.4 (stable) - labs per PCP  4. LBBB - stable  5. Bradycardia - has significant underlying conduction disease. Improved off carvedilol  - may need to consider PPM (CRT) at some point but does not meet criteria  - continue to follow  6. DNR/DNI [name] form on chart  Glori Bickers, MD 12:33 PM

## 2021-03-19 DIAGNOSIS — L84 Corns and callosities: Secondary | ICD-10-CM | POA: Diagnosis not present

## 2021-03-19 DIAGNOSIS — M79672 Pain in left foot: Secondary | ICD-10-CM | POA: Diagnosis not present

## 2021-03-19 DIAGNOSIS — M79671 Pain in right foot: Secondary | ICD-10-CM | POA: Diagnosis not present

## 2021-03-19 DIAGNOSIS — B351 Tinea unguium: Secondary | ICD-10-CM | POA: Diagnosis not present

## 2021-03-25 ENCOUNTER — Non-Acute Institutional Stay: Payer: Medicare HMO | Admitting: Adult Health

## 2021-03-25 ENCOUNTER — Encounter: Payer: Self-pay | Admitting: Adult Health

## 2021-03-25 DIAGNOSIS — H1032 Unspecified acute conjunctivitis, left eye: Secondary | ICD-10-CM

## 2021-03-25 MED ORDER — GENTAMICIN SULFATE 0.1 % EX OINT: 1.0000 "application " | TOPICAL_OINTMENT | Freq: Two times a day (BID) | CUTANEOUS | 0 refills | Status: AC

## 2021-03-25 NOTE — Progress Notes (Signed)
Location:  [facility] of Service:  ALF (13) Provider:   Cindi Carbon, [place] [phone]   [name], MD  Patient Care Team: [name], MD as PCP - General (Internal Medicine) Gayland Curry, DO as PCP - Internal Medicine (Geriatric Medicine) Royal Hawthorn, NP as Nurse Practitioner (Nurse Practitioner)  Extended Emergency Contact Information Primary Emergency Contact: Deroo,Roger Address: [address]          [place] [address] [name] Phone: [phone] Mobile Phone: [phone] Relation: Brother  Code Status:  DNR Goals of care: Advanced Directive information Advanced Directives 10/18/2020  Does Patient Have a Medical Advance Directive? Yes  Type of Advance Directive Out of facility DNR (pink MOST or yellow form)  Does patient want to make changes to medical advance directive? No - Patient declined  Copy of [place] in Chart? -  Would patient like information on creating a medical advance directive? -  Pre-existing out of facility DNR order (yellow form or pink MOST form) -     Chief Complaint  Patient presents with   Acute Visit    Left eye drainage     HPI:  Pt is a 85 y.o. male seen today for an acute visit for left eye drainage. Nurse reports that he has had left eye drainage and swelling for two days. The pt reports there has been no change in vision but the area feels irritated but not painful. No fever or cough or there symptoms.   Past Medical History:  Diagnosis Date   Arthritis    Atrial fibrillation (HCC)    Atrial flutter (HCC)    Cardiomyopathy    Nonischemic, LVEF 20%   CHF (congestive heart failure) (HCC)    Chronic renal insufficiency    COPD (chronic obstructive pulmonary disease) ([place])    Fracture of unspecified part of left clavicle, initial encounter for closed fracture    Hypothyroidism    Left ankle sprain    Left bundle branch block     Mixed hyperlipidemia    Renal failure    Scoliosis    Past Surgical History:  Procedure Laterality Date   EYE SURGERY     LAPAROSCOPIC CHOLECYSTECTOMY  2008   left foot surgery  1998   MASS EXCISION Left 06/26/2016   Procedure: EXCISION Mucoid tumor;  Surgeon: Daryll Brod, MD;  Location: [facility];  Service: Orthopedics;  Laterality: Left;  FAB   Right inguinal herniorrhaphy     ROTATION FLAP Left 06/26/2016   Procedure: debridement of distal interphalangeal possible, ROTATION FLAP left index finger;  Surgeon: Daryll Brod, MD;  Location: [facility];  Service: Orthopedics;  Laterality: Left;   SKIN CANCER REMOVED  LEFT EAR     TONSILLECTOMY      No Known Allergies  Outpatient Encounter Medications as of 03/25/2021  Medication Sig   gentamicin ointment (GARAMYCIN) 0.1 % Apply 1 application topically in the morning and at bedtime for 7 days. 0.5 inch ribbon to both eyes bid x 7 days   amiodarone (PACERONE) 200 MG tablet TAKE 1 TABLET BY MOUTH ONCE DAILY   ammonium lactate (AMLACTIN) 12 % cream Apply topically as needed for dry skin.   apixaban (ELIQUIS) 2.5 MG TABS tablet Take 1 tablet (2.5 mg total) by mouth 2 (two) times daily. Please cancel all previous orders for current medication. Change in dosage or pill size.   aspirin EC 81 MG tablet Take  81 mg by mouth daily.   furosemide (LASIX) 40 MG tablet Take 40 mg by mouth daily.   hydrALAZINE (APRESOLINE) 25 MG tablet TAKE 1 TABLET BY MOUTH THREE TIMES DAILY   hydrocortisone ointment 0.5 % Apply 1 application topically 2 (two) times daily as needed for itching. Right thumb   hydrocortisone valerate cream (WESTCORT) 0.2 % Apply 1 application topically 2 (two) times daily.   isosorbide mononitrate (IMDUR) 30 MG 24 hr tablet TAKE 1 TABLET BY MOUTH ONCE DAILY IN THE MORNING   ketoconazole (NIZORAL) 2 % cream Apply 1 application topically 2 (two) times daily.   ketoconazole (NIZORAL) 2 % shampoo Apply 1 application topically 2 (two) times a week.    levothyroxine (SYNTHROID) 150 MCG tablet Take 150 mcg by mouth daily before breakfast.   loratadine (CLARITIN) 10 MG tablet Take 10 mg by mouth daily.   losartan (COZAAR) 25 MG tablet TAKE 1 TABLET BY MOUTH ONCE DAILY   polyethylene glycol (MIRALAX / GLYCOLAX) 17 g packet Take 17 g by mouth daily as needed.   spironolactone (ALDACTONE) 25 MG tablet TAKE ONE TABLET BY MOUTH ONCE DAILY   No facility-administered encounter medications on file as of 03/25/2021.    Review of Systems  Immunization History  Administered Date(s) Administered   Influenza Inj Mdck Quad Pf 07/04/2016   Influenza Inj Mdck Quad With Preservative 06/18/2017   Influenza Split 06/02/2014, 06/02/2014, 06/14/2015   Influenza, High Dose Seasonal PF 07/14/2019, 07/06/2020   Influenza,inj,Quad PF,6+ Mos 07/15/2018   Moderna Sars-Covid-2 Vaccination 09/25/2019, 10/25/2019, 07/28/2020   Pneumococcal Conjugate-13 02/07/2016   Pneumococcal Polysaccharide-23 03/09/2018   Tdap 03/06/2018   Zoster Recombinat (Shingrix) 03/26/2018   Pertinent  Health Maintenance Due  Topic Date Due   INFLUENZA VACCINE  04/15/2021   PNA vac Low Risk Adult  Completed   Fall Risk  01/02/2021 06/20/2020 12/21/2019 09/05/2019 08/31/2019  Falls in the past year? 0 0 0 0 0  Number falls in past yr: 0 0 0 0 0  Injury with Fall? - 0 0 0 0  Comment - - - - -  Risk for fall due to : - - - Impaired vision -  Follow up - - - Falls evaluation completed -   Functional Status Survey:    There were no vitals filed for this visit. There is no height or weight on file to calculate BMI. Physical Exam Constitutional:      Appearance: Normal appearance.  Eyes:     General:        Right eye: No discharge.        Left eye: Discharge present.    Conjunctiva/sclera:     Right eye: Right conjunctiva is not injected. No chemosis, exudate or hemorrhage.    Left eye: Left conjunctiva is injected. Exudate present. No chemosis or hemorrhage.    Pupils: Pupils are  equal, round, and reactive to light.  Neurological:     Mental Status: He is alert.    Labs reviewed: Recent Labs    06/14/20 0000 01/03/21 0000  NA 138 134*  K 4.7 4.7  CL 101 97*  CO2 24* 27*  BUN 41* 44*  CREATININE 2.2* 2.4*  CALCIUM 9.0 9.3   Recent Labs    06/14/20 0000 01/03/21 0000  AST 22 22  ALT 12 15  ALKPHOS  --  83  ALBUMIN 4.0 4.0   Recent Labs    06/14/20 0000 01/03/21 0000  WBC 5.6 5.7  HGB 12.9* 12.9*  HCT 38* 39*  PLT 188 234   Lab Results  Component Value Date   TSH 1.29 02/28/2021   Lab Results  Component Value Date   HGBA1C 5.5 10/18/2017   Lab Results  Component Value Date   CHOL 171 01/03/2021   HDL 59 01/03/2021   LDLCALC 92 01/03/2021   TRIG 102 01/03/2021   CHOLHDL 3.4 10/18/2017    Significant Diagnostic Results in last 30 days:  No results found.  Assessment/Plan 1. Acute bacterial conjunctivitis of left eye - gentamicin ointment (GARAMYCIN) 0.1 %; Apply 1 application topically in the morning and at bedtime for 7 days. 0.5 inch ribbon to both eyes bid x 7 days  Dispense: 14 g; Refill: 0    Family/ staff Communication: nurse   Labs/tests ordered:  NA

## 2021-04-03 DIAGNOSIS — L738 Other specified follicular disorders: Secondary | ICD-10-CM | POA: Diagnosis not present

## 2021-04-03 DIAGNOSIS — D485 Neoplasm of uncertain behavior of skin: Secondary | ICD-10-CM | POA: Diagnosis not present

## 2021-04-03 DIAGNOSIS — L814 Other melanin hyperpigmentation: Secondary | ICD-10-CM | POA: Diagnosis not present

## 2021-04-03 DIAGNOSIS — Z85828 Personal history of other malignant neoplasm of skin: Secondary | ICD-10-CM | POA: Diagnosis not present

## 2021-04-03 DIAGNOSIS — L905 Scar conditions and fibrosis of skin: Secondary | ICD-10-CM | POA: Diagnosis not present

## 2021-04-03 DIAGNOSIS — L570 Actinic keratosis: Secondary | ICD-10-CM | POA: Diagnosis not present

## 2021-04-03 DIAGNOSIS — L089 Local infection of the skin and subcutaneous tissue, unspecified: Secondary | ICD-10-CM | POA: Diagnosis not present

## 2021-04-03 DIAGNOSIS — D692 Other nonthrombocytopenic purpura: Secondary | ICD-10-CM | POA: Diagnosis not present

## 2021-04-03 DIAGNOSIS — L821 Other seborrheic keratosis: Secondary | ICD-10-CM | POA: Diagnosis not present

## 2021-04-08 ENCOUNTER — Encounter: Payer: Self-pay | Admitting: Adult Health

## 2021-04-08 ENCOUNTER — Other Ambulatory Visit: Payer: Self-pay

## 2021-04-08 ENCOUNTER — Non-Acute Institutional Stay: Payer: Medicare HMO | Admitting: Adult Health

## 2021-04-08 VITALS — BP 108/76 | HR 58 | Temp 97.6°F | Ht 64.0 in | Wt 138.0 lb

## 2021-04-08 DIAGNOSIS — E039 Hypothyroidism, unspecified: Secondary | ICD-10-CM

## 2021-04-08 DIAGNOSIS — N400 Enlarged prostate without lower urinary tract symptoms: Secondary | ICD-10-CM

## 2021-04-08 DIAGNOSIS — N184 Chronic kidney disease, stage 4 (severe): Secondary | ICD-10-CM | POA: Diagnosis not present

## 2021-04-08 DIAGNOSIS — I5022 Chronic systolic (congestive) heart failure: Secondary | ICD-10-CM

## 2021-04-08 DIAGNOSIS — I4892 Unspecified atrial flutter: Secondary | ICD-10-CM

## 2021-04-08 NOTE — Progress Notes (Signed)
Location:  [facility]   POS: Clinic  Provider:  Cindi Carbon, [place] [phone]   Code Status: DNR Goals of Care:  Advanced Directives 04/08/2021  Does Patient Have a Medical Advance Directive? Yes  Type of Paramedic of [place];Living will;Out of facility DNR (pink MOST or yellow form)  Does patient want to make changes to medical advance directive? No - Patient declined  Copy of [place] in Chart? Yes - validated most recent copy scanned in chart (See row information)  Would patient like information on creating a medical advance directive? -  Pre-existing out of facility DNR order (yellow form or pink MOST form) Yellow form placed in chart (order not valid for inpatient use)     Chief Complaint  Patient presents with   Medical Management of Chronic Issues    Patient returns to the clinic for 3 month follow up    Health Maintenance    Shingrix and #4 covid    HPI: Patient is a 85 y.o. male seen today for medical management of chronic diseases.    No acute complaints today.  TSH 1.29 02/28/21  LDL 92 01/03/21  CKD IV:  BUN 44 Cr 2.4 01/03/21  2 d echo reviewed 07/28/19 EF 40-45%  Weight is fairly stable with slight trend down No sob or doe.   Has a hx of afib and flutter, LBBB with slow rate at times without dizziness or syncope   Has developmental delay with speech impediment MMSE 21/30 09/10/20  Saw eye doc 02/17/21  Receiving treatment for AK to the scalp by with Efudex 04/03/21, also had a bx of a lesion on the scalp ?SCC  Past Medical History:  Diagnosis Date   Arthritis    Atrial fibrillation (HCC)    Atrial flutter (HCC)    Cardiomyopathy    Nonischemic, LVEF 20%   CHF (congestive heart failure) (HCC)    Chronic renal insufficiency    COPD (chronic obstructive pulmonary disease) ([place])    Fracture of unspecified part of left clavicle, initial encounter for closed fracture     Hypothyroidism    Left ankle sprain    Left bundle branch block    Mixed hyperlipidemia    Renal failure    Scoliosis     Past Surgical History:  Procedure Laterality Date   EYE SURGERY     LAPAROSCOPIC CHOLECYSTECTOMY  2008   left foot surgery  1998   MASS EXCISION Left 06/26/2016   Procedure: EXCISION Mucoid tumor;  Surgeon: Daryll Brod, MD;  Location: [facility];  Service: Orthopedics;  Laterality: Left;  FAB   Right inguinal herniorrhaphy     ROTATION FLAP Left 06/26/2016   Procedure: debridement of distal interphalangeal possible, ROTATION FLAP left index finger;  Surgeon: Daryll Brod, MD;  Location: [facility];  Service: Orthopedics;  Laterality: Left;   SKIN CANCER REMOVED  LEFT EAR     TONSILLECTOMY      No Known Allergies  Outpatient Encounter Medications as of 04/08/2021  Medication Sig   amiodarone (PACERONE) 200 MG tablet TAKE 1 TABLET BY MOUTH ONCE DAILY   ammonium lactate (AMLACTIN) 12 % cream Apply topically as needed for dry skin.   apixaban (ELIQUIS) 2.5 MG TABS tablet Take 1 tablet (2.5 mg total) by mouth 2 (two) times daily. Please cancel all previous orders for current medication. Change in dosage or pill size.   aspirin EC 81 MG tablet Take 81 mg by mouth  daily.   furosemide (LASIX) 40 MG tablet Take 40 mg by mouth daily.   hydrALAZINE (APRESOLINE) 25 MG tablet TAKE 1 TABLET BY MOUTH THREE TIMES DAILY   hydrocortisone ointment 0.5 % Apply 1 application topically 2 (two) times daily as needed for itching. Right thumb   hydrocortisone valerate cream (WESTCORT) 0.2 % Apply 1 application topically 2 (two) times daily.   isosorbide mononitrate (IMDUR) 30 MG 24 hr tablet TAKE 1 TABLET BY MOUTH ONCE DAILY IN THE MORNING   ketoconazole (NIZORAL) 2 % cream Apply 1 application topically 2 (two) times daily.   ketoconazole (NIZORAL) 2 % shampoo Apply 1 application topically 2 (two) times a week.   levothyroxine (SYNTHROID) 150 MCG tablet Take 150 mcg by mouth daily before breakfast.    loratadine (CLARITIN) 10 MG tablet Take 10 mg by mouth daily.   losartan (COZAAR) 25 MG tablet TAKE 1 TABLET BY MOUTH ONCE DAILY   polyethylene glycol (MIRALAX / GLYCOLAX) 17 g packet Take 17 g by mouth daily as needed.   spironolactone (ALDACTONE) 25 MG tablet TAKE ONE TABLET BY MOUTH ONCE DAILY   No facility-administered encounter medications on file as of 04/08/2021.    Review of Systems:  Review of Systems  Constitutional:  Negative for activity change, appetite change, chills, diaphoresis, fatigue, fever and unexpected weight change.  HENT:  Negative for congestion.   Eyes:  Negative for visual disturbance.  Respiratory:  Negative for cough and wheezing.   Cardiovascular:  Positive for leg swelling. Negative for chest pain.  Gastrointestinal:  Negative for abdominal pain, constipation, diarrhea, nausea and vomiting.  Genitourinary:  Negative for dysuria and urgency.  Musculoskeletal:  Positive for gait problem (shuffles). Negative for back pain, myalgias and neck pain.       Scoliosis  Skin:  Negative for rash.  Neurological:  Positive for speech difficulty. Negative for dizziness, facial asymmetry, weakness and light-headedness.  Psychiatric/Behavioral:  Negative for agitation, behavioral problems and confusion.    Health Maintenance  Topic Date Due   COVID-19 Vaccine (4 - Booster for Moderna series) 10/28/2020   INFLUENZA VACCINE  04/15/2021   TETANUS/TDAP  03/06/2028   PNA vac Low Risk Adult  Completed   Zoster Vaccines- Shingrix  Completed   HPV VACCINES  Aged Out    Physical Exam: Vitals:   04/08/21 1356  BP: 108/76  Pulse: (!) 58  Temp: 97.6 F (36.4 C)  SpO2: 98%  Weight: 138 lb (62.6 kg)  Height: 5\' 4"  (1.626 m)   Body mass index is 23.69 kg/m. Physical Exam Vitals and nursing note reviewed.  Constitutional:      General: He is not in acute distress.    Appearance: He is not diaphoretic.  HENT:     Head: Normocephalic and atraumatic.     Nose: No  congestion.     Mouth/Throat:     Mouth: Mucous membranes are moist.     Pharynx: Oropharynx is clear.  Eyes:     Conjunctiva/sclera: Conjunctivae normal.     Pupils: Pupils are equal, round, and reactive to light.  Neck:     Thyroid: No thyromegaly.     Vascular: No JVD.     Trachea: No tracheal deviation.  Cardiovascular:     Rate and Rhythm: Normal rate and regular rhythm.     Heart sounds: No murmur heard. Pulmonary:     Effort: Pulmonary effort is normal. No respiratory distress.     Breath sounds: Normal breath sounds. No wheezing.  Abdominal:     General: Bowel sounds are normal. There is no distension.     Palpations: Abdomen is soft.     Tenderness: There is no abdominal tenderness.  Musculoskeletal:        General: Deformity (kyphoscoliosis) present.     Right lower leg: No edema.     Left lower leg: No edema.  Lymphadenopathy:     Cervical: No cervical adenopathy.  Skin:    General: Skin is warm and dry.  Neurological:     Mental Status: He is alert and oriented to person, place, and time.     Cranial Nerves: No cranial nerve deficit.  Psychiatric:        Mood and Affect: Mood normal.    Labs reviewed: Basic Metabolic Panel: Recent Labs    06/14/20 0000 01/03/21 0000 02/28/21 0000  NA 138 134*  --   K 4.7 4.7  --   CL 101 97*  --   CO2 24* 27*  --   BUN 41* 44*  --   CREATININE 2.2* 2.4*  --   CALCIUM 9.0 9.3  --   TSH 0.54 0.20* 1.29   Liver Function Tests: Recent Labs    06/14/20 0000 01/03/21 0000  AST 22 22  ALT 12 15  ALKPHOS  --  83  ALBUMIN 4.0 4.0   No results for input(s): LIPASE, AMYLASE in the last 8760 hours. No results for input(s): AMMONIA in the last 8760 hours. CBC: Recent Labs    06/14/20 0000 01/03/21 0000  WBC 5.6 5.7  HGB 12.9* 12.9*  HCT 38* 39*  PLT 188 234   Lipid Panel: Recent Labs    01/03/21 0000  CHOL 171  HDL 59  LDLCALC 92  TRIG 102   Lab Results  Component Value Date   HGBA1C 5.5 10/18/2017     Procedures since last visit: No results found.  Assessment/Plan  1. Chronic systolic heart failure ([place]) Compensated on exam today Followed by CHF clinic   2. Atrial flutter, unspecified type ([place]) Rate regular  but slow on exam Has bradycardia but not symptomatic On Eliquis of CVA risk reduction On amiodarone   3. Acquired hypothyroidism Continue Synthroid 150 mcg qd  4. CKD (chronic kidney disease) stage 4, GFR 15-29 ml/min (HCC) Continue to periodically monitor BMP and avoid nephrotoxic agents  5. Benign fibroma of prostate No current symptoms PSA monitored Lab Results  Component Value Date   PSA 0.40 06/14/2020   PSA 0.33 12/22/2019     Shingrix vaccine info updated  Recommend 4th covid booster   Total time 65min:  time greater than 50% of total time spent doing pt counseling and coordination of care     Labs/tests ordered:  CBC BMP PSA in 4 months prior to apt Next appt:  4 months with Dr. Lyndel Safe

## 2021-05-01 DIAGNOSIS — L905 Scar conditions and fibrosis of skin: Secondary | ICD-10-CM | POA: Diagnosis not present

## 2021-05-01 DIAGNOSIS — L570 Actinic keratosis: Secondary | ICD-10-CM | POA: Diagnosis not present

## 2021-05-01 DIAGNOSIS — Z85828 Personal history of other malignant neoplasm of skin: Secondary | ICD-10-CM | POA: Diagnosis not present

## 2021-05-28 DIAGNOSIS — B351 Tinea unguium: Secondary | ICD-10-CM | POA: Diagnosis not present

## 2021-05-28 DIAGNOSIS — M79672 Pain in left foot: Secondary | ICD-10-CM | POA: Diagnosis not present

## 2021-05-28 DIAGNOSIS — L84 Corns and callosities: Secondary | ICD-10-CM | POA: Diagnosis not present

## 2021-05-28 DIAGNOSIS — M79671 Pain in right foot: Secondary | ICD-10-CM | POA: Diagnosis not present

## 2021-05-29 DIAGNOSIS — L570 Actinic keratosis: Secondary | ICD-10-CM | POA: Diagnosis not present

## 2021-05-29 DIAGNOSIS — L814 Other melanin hyperpigmentation: Secondary | ICD-10-CM | POA: Diagnosis not present

## 2021-05-29 DIAGNOSIS — Z85828 Personal history of other malignant neoplasm of skin: Secondary | ICD-10-CM | POA: Diagnosis not present

## 2021-07-05 DIAGNOSIS — I10 Essential (primary) hypertension: Secondary | ICD-10-CM | POA: Diagnosis not present

## 2021-07-05 LAB — BASIC METABOLIC PANEL
BUN: 41 — AB (ref 4–21)
CO2: 23 — AB (ref 13–22)
Chloride: 99 (ref 99–108)
Creatinine: 2.4 — AB (ref 0.6–1.3)
Glucose: 157
Potassium: 5.4 — AB (ref 3.4–5.3)
Sodium: 136 — AB (ref 137–147)

## 2021-07-05 LAB — COMPREHENSIVE METABOLIC PANEL: Calcium: 8.4 — AB (ref 8.7–10.7)

## 2021-07-05 LAB — CBC AND DIFFERENTIAL
HCT: 33 — AB (ref 41–53)
Hemoglobin: 10.9 — AB (ref 13.5–17.5)
Platelets: 342 (ref 150–399)
WBC: 12

## 2021-07-05 LAB — CBC: RBC: 3.4 — AB (ref 3.87–5.11)

## 2021-07-06 ENCOUNTER — Inpatient Hospital Stay (HOSPITAL_COMMUNITY)
Admission: EM | Admit: 2021-07-06 | Discharge: 2021-07-09 | DRG: 291 | Disposition: A | Discharge status: Skilled Nursing Facility | Payer: Medicare HMO | Source: Skilled Nursing Facility | Attending: Internal Medicine | Admitting: Internal Medicine

## 2021-07-06 ENCOUNTER — Emergency Department (HOSPITAL_COMMUNITY): Payer: Medicare HMO

## 2021-07-06 ENCOUNTER — Encounter: Payer: Self-pay | Admitting: Internal Medicine

## 2021-07-06 ENCOUNTER — Encounter (HOSPITAL_COMMUNITY): Payer: Self-pay | Admitting: Emergency Medicine

## 2021-07-06 DIAGNOSIS — I480 Paroxysmal atrial fibrillation: Secondary | ICD-10-CM | POA: Diagnosis not present

## 2021-07-06 DIAGNOSIS — R001 Bradycardia, unspecified: Secondary | ICD-10-CM | POA: Diagnosis present

## 2021-07-06 DIAGNOSIS — Z808 Family history of malignant neoplasm of other organs or systems: Secondary | ICD-10-CM | POA: Diagnosis not present

## 2021-07-06 DIAGNOSIS — J9601 Acute respiratory failure with hypoxia: Secondary | ICD-10-CM | POA: Diagnosis present

## 2021-07-06 DIAGNOSIS — R5383 Other fatigue: Secondary | ICD-10-CM | POA: Diagnosis not present

## 2021-07-06 DIAGNOSIS — Z79890 Hormone replacement therapy (postmenopausal): Secondary | ICD-10-CM

## 2021-07-06 DIAGNOSIS — Z823 Family history of stroke: Secondary | ICD-10-CM | POA: Diagnosis not present

## 2021-07-06 DIAGNOSIS — I130 Hypertensive heart and chronic kidney disease with heart failure and stage 1 through stage 4 chronic kidney disease, or unspecified chronic kidney disease: Principal | ICD-10-CM | POA: Diagnosis present

## 2021-07-06 DIAGNOSIS — Z20822 Contact with and (suspected) exposure to covid-19: Secondary | ICD-10-CM | POA: Diagnosis not present

## 2021-07-06 DIAGNOSIS — I5043 Acute on chronic combined systolic (congestive) and diastolic (congestive) heart failure: Secondary | ICD-10-CM | POA: Diagnosis not present

## 2021-07-06 DIAGNOSIS — N184 Chronic kidney disease, stage 4 (severe): Secondary | ICD-10-CM | POA: Diagnosis not present

## 2021-07-06 DIAGNOSIS — I428 Other cardiomyopathies: Secondary | ICD-10-CM | POA: Diagnosis not present

## 2021-07-06 DIAGNOSIS — R4189 Other symptoms and signs involving cognitive functions and awareness: Secondary | ICD-10-CM | POA: Diagnosis present

## 2021-07-06 DIAGNOSIS — Z7982 Long term (current) use of aspirin: Secondary | ICD-10-CM

## 2021-07-06 DIAGNOSIS — I10 Essential (primary) hypertension: Secondary | ICD-10-CM | POA: Diagnosis present

## 2021-07-06 DIAGNOSIS — R0689 Other abnormalities of breathing: Secondary | ICD-10-CM | POA: Diagnosis not present

## 2021-07-06 DIAGNOSIS — Z833 Family history of diabetes mellitus: Secondary | ICD-10-CM | POA: Diagnosis not present

## 2021-07-06 DIAGNOSIS — Z800 Family history of malignant neoplasm of digestive organs: Secondary | ICD-10-CM

## 2021-07-06 DIAGNOSIS — Z79899 Other long term (current) drug therapy: Secondary | ICD-10-CM | POA: Diagnosis not present

## 2021-07-06 DIAGNOSIS — Z7901 Long term (current) use of anticoagulants: Secondary | ICD-10-CM | POA: Diagnosis not present

## 2021-07-06 DIAGNOSIS — J449 Chronic obstructive pulmonary disease, unspecified: Secondary | ICD-10-CM | POA: Diagnosis not present

## 2021-07-06 DIAGNOSIS — I4892 Unspecified atrial flutter: Secondary | ICD-10-CM | POA: Diagnosis present

## 2021-07-06 DIAGNOSIS — Z8261 Family history of arthritis: Secondary | ICD-10-CM | POA: Diagnosis not present

## 2021-07-06 DIAGNOSIS — I959 Hypotension, unspecified: Secondary | ICD-10-CM | POA: Diagnosis not present

## 2021-07-06 DIAGNOSIS — R0602 Shortness of breath: Secondary | ICD-10-CM | POA: Diagnosis present

## 2021-07-06 DIAGNOSIS — I5023 Acute on chronic systolic (congestive) heart failure: Secondary | ICD-10-CM | POA: Diagnosis not present

## 2021-07-06 DIAGNOSIS — Z8249 Family history of ischemic heart disease and other diseases of the circulatory system: Secondary | ICD-10-CM | POA: Diagnosis not present

## 2021-07-06 DIAGNOSIS — Z66 Do not resuscitate: Secondary | ICD-10-CM | POA: Diagnosis present

## 2021-07-06 DIAGNOSIS — Z7401 Bed confinement status: Secondary | ICD-10-CM | POA: Diagnosis not present

## 2021-07-06 DIAGNOSIS — N179 Acute kidney failure, unspecified: Secondary | ICD-10-CM | POA: Diagnosis not present

## 2021-07-06 DIAGNOSIS — J9600 Acute respiratory failure, unspecified whether with hypoxia or hypercapnia: Secondary | ICD-10-CM | POA: Diagnosis not present

## 2021-07-06 DIAGNOSIS — E039 Hypothyroidism, unspecified: Secondary | ICD-10-CM | POA: Diagnosis present

## 2021-07-06 DIAGNOSIS — I447 Left bundle-branch block, unspecified: Secondary | ICD-10-CM | POA: Diagnosis not present

## 2021-07-06 DIAGNOSIS — R0902 Hypoxemia: Secondary | ICD-10-CM | POA: Diagnosis not present

## 2021-07-06 DIAGNOSIS — E782 Mixed hyperlipidemia: Secondary | ICD-10-CM | POA: Diagnosis present

## 2021-07-06 DIAGNOSIS — R531 Weakness: Secondary | ICD-10-CM | POA: Diagnosis not present

## 2021-07-06 DIAGNOSIS — I509 Heart failure, unspecified: Secondary | ICD-10-CM | POA: Diagnosis not present

## 2021-07-06 NOTE — ED Provider Notes (Signed)
[facility] EMERGENCY DEPARTMENT Provider Note   CSN: [id number] Arrival date & time: 07/06/21  2335     History Chief Complaint  Patient presents with   Shortness of Breath   Weakness    [name] is a 85 y.o. male.  Patient brought to the emergency department from [facility].  Patient has reportedly declined over the past week.  He is normally able to ambulate on his own and has become very weak, was unable to get out of bed yesterday.  He has become short of breath.  It appears that he was evaluated by the physician yesterday, had lab work and was started on oxygen, 2 L by nasal cannula.  Continued to weaken tonight, was sent to the ER by ambulance for further evaluation.  At arrival he indicates that he is short of breath.  He states that he feels congested and has had a cough.      Past Medical History:  Diagnosis Date   Arthritis    Atrial fibrillation (HCC)    Atrial flutter ([place])    Cardiomyopathy    Nonischemic, LVEF 20%   CHF (congestive heart failure) (HCC)    Chronic renal insufficiency    COPD (chronic obstructive pulmonary disease) ([place])    Fracture of unspecified part of left clavicle, initial encounter for closed fracture    Hypothyroidism    Left ankle sprain    Left bundle branch block    Mixed hyperlipidemia    Renal failure    Scoliosis     Patient Active Problem List   Diagnosis Date Noted   Acute on chronic systolic CHF (congestive heart failure) ([place]) 07/07/2021   Acute respiratory failure with hypoxia ([place]) 07/07/2021   Essential hypertension 07/07/2021   Venous insufficiency (chronic) (peripheral) 06/16/2018   Chronic pain of left ankle 06/16/2018   Right shoulder pain 10/17/2017   CKD (chronic kidney disease) stage 4, GFR 15-29 ml/min (HCC) 10/17/2017   Closed fracture of left Clavicle 08/19/17 09/24/2017   COPD (chronic obstructive pulmonary disease) (HCC)    Hematuria, microscopic    Weakness 04/24/2017    Mucoid cyst, joint 05/12/2016   Primary osteoarthritis of first carpometacarpal joint of left hand 02/18/2016   Acquired hypothyroidism 05/19/2012   Benign fibroma of prostate 05/19/2012   Block, bundle branch, left 05/19/2012   Long term (current) use of anticoagulants 01/31/2011   Rash 01/13/2011   Paroxysmal atrial flutter ([place]) 81/82/9937   Chronic systolic heart failure ([place]) 12/02/2010   Mixed hyperlipidemia 11/08/2010   Secondary cardiomyopathy ([place]) 11/08/2010   Left bundle branch block (LBBB) determined by electrocardiography 11/08/2010    Past Surgical History:  Procedure Laterality Date   EYE SURGERY     LAPAROSCOPIC CHOLECYSTECTOMY  2008   left foot surgery  1998   MASS EXCISION Left 06/26/2016   Procedure: EXCISION Mucoid tumor;  Surgeon: Daryll Brod, MD;  Location: [facility];  Service: Orthopedics;  Laterality: Left;  FAB   Right inguinal herniorrhaphy     ROTATION FLAP Left 06/26/2016   Procedure: debridement of distal interphalangeal possible, ROTATION FLAP left index finger;  Surgeon: Daryll Brod, MD;  Location: [facility];  Service: Orthopedics;  Laterality: Left;   SKIN CANCER REMOVED  LEFT EAR     TONSILLECTOMY         Family History  Problem Relation Age of Onset   Heart disease Father        Died age 43   Stroke Mother  Died age 62   Diabetes Mother    Pancreatic cancer Brother    Arthritis Brother    Throat cancer Brother    Arthritis Brother    Diabetes Sister    Seizures Sister     Social History   Tobacco Use   Smoking status: Never   Smokeless tobacco: Never  Vaping Use   Vaping Use: Never used  Substance Use Topics   Alcohol use: Not Currently   Drug use: No    Home Medications Prior to Admission medications   Medication Sig Start Date End Date Taking? Authorizing Provider  amiodarone (PACERONE) 200 MG tablet TAKE 1 TABLET BY MOUTH ONCE DAILY Patient taking differently: Take 200 mg by mouth daily. 01/05/18  Yes Bensimhon, Shaune Pascal, MD   ammonium lactate (AMLACTIN) 12 % cream Apply 1 application topically as needed for dry skin.   Yes [provider]  apixaban (ELIQUIS) 2.5 MG TABS tablet Take 1 tablet (2.5 mg total) by mouth 2 (two) times daily. Please cancel all previous orders for current medication. Change in dosage or pill size. 11/09/18  Yes Bensimhon, Shaune Pascal, MD  aspirin EC 81 MG tablet Take 81 mg by mouth daily.   Yes [provider]  furosemide (LASIX) 40 MG tablet Take 40 mg by mouth daily.   Yes [provider]  hydrALAZINE (APRESOLINE) 25 MG tablet TAKE 1 TABLET BY MOUTH THREE TIMES DAILY Patient taking differently: Take 25 mg by mouth 3 (three) times daily. 12/23/17  Yes Bensimhon, Shaune Pascal, MD  hydrocortisone ointment 0.5 % Apply 1 application topically 2 (two) times daily as needed for itching. Right thumb   Yes [provider]  hydrocortisone valerate cream (WESTCORT) 0.2 % Apply 1 application topically 2 (two) times daily. To affected areas of ear, scalp, hair, and nostrils/nose   Yes [provider]  isosorbide mononitrate (IMDUR) 30 MG 24 hr tablet TAKE 1 TABLET BY MOUTH ONCE DAILY IN THE MORNING Patient taking differently: Take 30 mg by mouth daily. 09/10/17  Yes Bensimhon, Shaune Pascal, MD  ketoconazole (NIZORAL) 2 % cream Apply 1 application topically 2 (two) times daily. To affected areas of ear, scalp, hair, and nostrils/nose   Yes [provider]  ketoconazole (NIZORAL) 2 % shampoo Apply 1 application topically 2 (two) times a week. With hair washing on Wednesday's and Saturday's   Yes [provider]  levothyroxine (SYNTHROID) 150 MCG tablet Take 150 mcg by mouth daily before breakfast.   Yes [provider]  loratadine (CLARITIN) 10 MG tablet Take 10 mg by mouth daily.   Yes [provider]  losartan (COZAAR) 25 MG tablet TAKE 1 TABLET BY MOUTH ONCE DAILY Patient taking differently: Take 25 mg by mouth daily. 12/29/18  Yes Reed,  Tiffany L, DO  polyethylene glycol (MIRALAX / GLYCOLAX) 17 g packet Take 17 g by mouth daily as needed for mild constipation.   Yes [provider]  spironolactone (ALDACTONE) 25 MG tablet TAKE ONE TABLET BY MOUTH ONCE DAILY Patient taking differently: Take 25 mg by mouth once. 02/03/17  Yes Bensimhon, Shaune Pascal, MD    Allergies    Patient has no known allergies.  Review of Systems   Review of Systems  Constitutional:  Positive for fatigue.  Respiratory:  Positive for cough and shortness of breath.   All other systems reviewed and are negative.  Physical Exam Updated Vital Signs BP (!) 103/46 (BP Location: Right Arm)   Pulse 66   Temp  98.7 F (37.1 C) (Oral)   Resp 20   Ht 5\' 4"  (1.626 m)   Wt 60.4 kg   SpO2 96%   BMI 22.86 kg/m   Physical Exam Vitals and nursing note reviewed.  Constitutional:      General: He is not in acute distress.    Appearance: Normal appearance. He is well-developed.  HENT:     Head: Normocephalic and atraumatic.     Right Ear: Hearing normal.     Left Ear: Hearing normal.     Nose: Nose normal.  Eyes:     Conjunctiva/sclera: Conjunctivae normal.     Pupils: Pupils are equal, round, and reactive to light.  Cardiovascular:     Rate and Rhythm: Regular rhythm.     Heart sounds: S1 normal and S2 normal. No murmur heard.   No friction rub. No gallop.  Pulmonary:     Effort: Tachypnea and accessory muscle usage present. No respiratory distress.     Breath sounds: Decreased breath sounds present.  Chest:     Chest wall: No tenderness.  Abdominal:     General: Bowel sounds are normal.     Palpations: Abdomen is soft.     Tenderness: There is no abdominal tenderness. There is no guarding or rebound. Negative signs include Murphy's sign and McBurney's sign.     Hernia: No hernia is present.  Musculoskeletal:        General: Normal range of motion.     Cervical back: Normal range of motion and neck supple.     Right lower leg: Edema  (trace) present.     Left lower leg: Edema (trace) present.  Skin:    General: Skin is warm and dry.     Findings: No rash.  Neurological:     Mental Status: He is alert and oriented to person, place, and time.     GCS: GCS eye subscore is 4. GCS verbal subscore is 5. GCS motor subscore is 6.     Cranial Nerves: No cranial nerve deficit.     Sensory: No sensory deficit.     Coordination: Coordination normal.  Psychiatric:        Speech: Speech normal.        Behavior: Behavior normal.        Thought Content: Thought content normal.    ED Results / Procedures / Treatments   Labs (all labs ordered are listed, but only abnormal results are displayed) Labs Reviewed  CBC WITH DIFFERENTIAL/PLATELET - Abnormal; Notable for the following components:      Result Value   RBC 3.24 (*)    Hemoglobin 10.3 (*)    HCT 32.6 (*)    MCV 100.6 (*)    Neutro Abs 8.3 (*)    All other components within normal limits  COMPREHENSIVE METABOLIC PANEL - Abnormal; Notable for the following components:   Sodium 133 (*)    Glucose, Bld 151 (*)    BUN 35 (*)    Creatinine, Ser 2.17 (*)    Calcium 7.8 (*)    Albumin 2.3 (*)    GFR, Estimated 30 (*)    All other components within normal limits  BRAIN NATRIURETIC PEPTIDE - Abnormal; Notable for the following components:   B Natriuretic Peptide 901.6 (*)    All other components within normal limits  URINALYSIS, ROUTINE W REFLEX MICROSCOPIC - Abnormal; Notable for the following components:   Color, Urine STRAW (*)    All other components within normal  limits  C-REACTIVE PROTEIN - Abnormal; Notable for the following components:   CRP 20.0 (*)    All other components within normal limits  CBC WITH DIFFERENTIAL/PLATELET - Abnormal; Notable for the following components:   WBC 12.2 (*)    RBC 3.41 (*)    Hemoglobin 11.0 (*)    HCT 34.1 (*)    Neutro Abs 9.7 (*)    Monocytes Absolute 1.3 (*)    All other components within normal limits  COMPREHENSIVE  METABOLIC PANEL - Abnormal; Notable for the following components:   Sodium 133 (*)    Glucose, Bld 118 (*)    BUN 34 (*)    Creatinine, Ser 2.05 (*)    Calcium 7.8 (*)    Total Protein 6.4 (*)    Albumin 2.2 (*)    GFR, Estimated 32 (*)    All other components within normal limits  I-STAT ARTERIAL BLOOD GAS, ED - Abnormal; Notable for the following components:   pH, Arterial 7.497 (*)    pCO2 arterial 28.6 (*)    pO2, Arterial 33 (*)    Sodium 131 (*)    Calcium, Ion 1.03 (*)    HCT 27.0 (*)    Hemoglobin 9.2 (*)    All other components within normal limits  TROPONIN I (HIGH SENSITIVITY) - Abnormal; Notable for the following components:   Troponin I (High Sensitivity) 42 (*)    All other components within normal limits  TROPONIN I (HIGH SENSITIVITY) - Abnormal; Notable for the following components:   Troponin I (High Sensitivity) 47 (*)    All other components within normal limits  RESP PANEL BY RT-PCR (FLU A&B, COVID) ARPGX2  LACTIC ACID, PLASMA  PROCALCITONIN  MAGNESIUM  CBC  BASIC METABOLIC PANEL    EKG EKG Interpretation  Date/Time:  Saturday July 06 2021 23:41:08 EDT Ventricular Rate:  72 PR Interval:  216 QRS Duration: 157 QT Interval:  451 QTC Calculation: 494 R Axis:   33 Text Interpretation: Sinus rhythm Borderline prolonged PR interval IVCD, consider atypical RBBB LVH with IVCD and secondary repol abnrm ST depr, consider ischemia, inferior leads Borderline prolonged QT interval No significant change since last tracing Confirmed by Orpah Greek ([id number]) on 07/07/2021 12:57:32 AM  Radiology DG Chest Port 1 View  Result Date: 07/07/2021 CLINICAL DATA:  Weakness and shortness of breath. EXAM: PORTABLE CHEST 1 VIEW COMPARISON:  October 22, 2017 FINDINGS: The lungs are hyperinflated. Diffuse, chronic appearing increased interstitial lung markings are seen. Mild areas of atelectasis and/or infiltrate are seen involving the mid left lung, bilateral lung  bases and right upper lobe. There is no evidence of a pleural effusion or pneumothorax. Evaluation of the superior mediastinum is limited secondary to patient positioning. The cardiac silhouette is moderately enlarged. Radiopaque surgical clips are seen within the left upper quadrant. Degenerative changes seen throughout the thoracic spine. IMPRESSION: Chronic appearing increased interstitial lung markings with mild areas of bilateral atelectasis and/or infiltrate. Electronically Signed   By: Virgina Norfolk M.D.   On: 07/07/2021 00:11   ECHOCARDIOGRAM COMPLETE  Result Date: 07/07/2021    ECHOCARDIOGRAM REPORT   Patient Name:   [name] Date of Exam: 07/07/2021 Medical Rec #:  [id number]        Height:       64.0 in Accession #:    [id number]       Weight:       145.0 lb Date of Birth:  15-Feb-1936  BSA:          1.706 m Patient Age:    57 years         BP:           107/55 mmHg Patient Gender: M                HR:           72 bpm. Exam Location:  Inpatient Procedure: 2D Echo, Cardiac Doppler and Color Doppler Indications:    CHF  History:        Patient has prior history of Echocardiogram examinations, most                 recent 07/28/2019. Arrythmias:LBBB; Risk Factors:Dyslipidemia.  Sonographer:    Maudry Mayhew [place], RVT, RDCS Referring Phys: [id number] [facility]  Sonographer Comments: No subcostal window. IMPRESSIONS  1. Left ventricular ejection fraction, by estimation, is 30 to 35%. The left ventricle has moderately decreased function. The left ventricle demonstrates regional wall motion abnormalities (see scoring diagram/findings for description). The left ventricular internal cavity size was mildly dilated. Left ventricular diastolic parameters are consistent with Grade II diastolic dysfunction (pseudonormalization). Elevated left ventricular end-diastolic pressure.  2. Right ventricular systolic function is mildly reduced. The right ventricular size is normal.  3. Left  atrial size was moderately dilated.  4. Right atrial size was mild to moderately dilated.  5. The mitral valve is normal in structure. Mild mitral valve regurgitation. No evidence of mitral stenosis.  6. The aortic valve was not well visualized. Aortic valve regurgitation is mild to moderate. Comparison(s): Changes from prior study are noted. Conclusion(s)/Recommendation(s): EF reduced compared to prior. Global hypokinesis with akinesis/dyskinesis of the entire septum, out of proportion to dyssychrony from LBBB. FINDINGS  Left Ventricle: Left ventricular ejection fraction, by estimation, is 30 to 35%. The left ventricle has moderately decreased function. The left ventricle demonstrates regional wall motion abnormalities. The left ventricular internal cavity size was mildly dilated. There is no left ventricular hypertrophy. Abnormal (paradoxical) septal motion, consistent with left bundle branch block. Left ventricular diastolic parameters are consistent with Grade II diastolic dysfunction (pseudonormalization). Elevated left ventricular end-diastolic pressure.  LV Wall Scoring: The anterior septum is dyskinetic. The inferior septum is akinetic. The entire anterior wall, entire lateral wall, entire inferior wall, and apex are hypokinetic. Right Ventricle: The right ventricular size is normal. Right vetricular wall thickness was not well visualized. Right ventricular systolic function is mildly reduced. Left Atrium: Left atrial size was moderately dilated. Right Atrium: Right atrial size was mild to moderately dilated. Pericardium: There is no evidence of pericardial effusion. Mitral Valve: The mitral valve is normal in structure. Mild mitral valve regurgitation. No evidence of mitral valve stenosis. Tricuspid Valve: The tricuspid valve is normal in structure. Tricuspid valve regurgitation is trivial. No evidence of tricuspid stenosis. Aortic Valve: The aortic valve was not well visualized. Aortic valve regurgitation  is mild to moderate. Aortic regurgitation PHT measures 422 msec. Aortic valve mean gradient measures 4.0 mmHg. Aortic valve peak gradient measures 7.0 mmHg. Aortic valve area, by VTI measures 1.56 cm. Pulmonic Valve: The pulmonic valve was not well visualized. Pulmonic valve regurgitation is not visualized. Aorta: The aortic root is normal in size and structure, the aortic arch was not well visualized and the ascending aorta was not well visualized. IAS/Shunts: The atrial septum is grossly normal.  LEFT VENTRICLE PLAX 2D LVIDd:         5.75 cm   Diastology  LVIDs:         4.90 cm   LV e' medial:    4.12 cm/s LV PW:         0.70 cm   LV E/e' medial:  30.3 LV IVS:        0.60 cm   LV e' lateral:   8.93 cm/s LVOT diam:     1.70 cm   LV E/e' lateral: 14.0 LV SV:         44 LV SV Index:   26 LVOT Area:     2.27 cm  RIGHT VENTRICLE TAPSE (M-mode): 1.5 cm LEFT ATRIUM             Index        RIGHT ATRIUM           Index LA diam:        3.50 cm 2.05 cm/m   RA Area:     17.20 cm LA Vol (A2C):   48.9 ml 28.66 ml/m  RA Volume:   48.10 ml  28.19 ml/m LA Vol (A4C):   73.1 ml 42.84 ml/m LA Biplane Vol: 59.9 ml 35.10 ml/m  AORTIC VALVE AV Area (Vmax):    1.61 cm AV Area (Vmean):   1.57 cm AV Area (VTI):     1.56 cm AV Vmax:           132.00 cm/s AV Vmean:          85.500 cm/s AV VTI:            0.280 m AV Peak Grad:      7.0 mmHg AV Mean Grad:      4.0 mmHg LVOT Vmax:         93.80 cm/s LVOT Vmean:        59.100 cm/s LVOT VTI:          0.192 m LVOT/AV VTI ratio: 0.69 AI PHT:            422 msec  AORTA Ao Root diam: 3.10 cm MITRAL VALVE                TRICUSPID VALVE MV Area (PHT): 2.07 cm     TR Peak grad:   12.4 mmHg MV Decel Time: 366 msec     TR Vmax:        176.00 cm/s MV E velocity: 124.67 cm/s MV A velocity: 93.60 cm/s   SHUNTS MV E/A ratio:  1.33         Systemic VTI:  0.19 m                             Systemic Diam: 1.70 cm Buford Dresser MD Electronically signed by Buford Dresser MD Signature  Date/Time: 07/07/2021/2:22:20 PM    Final     Procedures Procedures   Medications Ordered in ED Medications  hydrALAZINE (APRESOLINE) injection 10 mg (has no administration in time range)  aspirin EC tablet 81 mg (81 mg Oral Given 07/07/21 0902)  amiodarone (PACERONE) tablet 200 mg (200 mg Oral Given 07/07/21 0902)  hydrALAZINE (APRESOLINE) tablet 25 mg (25 mg Oral Not Given 07/07/21 2111)  isosorbide mononitrate (IMDUR) 24 hr tablet 30 mg (30 mg Oral Given 07/07/21 0902)  losartan (COZAAR) tablet 25 mg (25 mg Oral Given 07/07/21 0903)  levothyroxine (SYNTHROID) tablet 150 mcg (150 mcg Oral Given 07/07/21 0903)  apixaban (ELIQUIS) tablet 2.5 mg (2.5 mg Oral Given 07/07/21 2119)  acetaminophen (TYLENOL) tablet 650 mg (has no administration in time range)  furosemide (LASIX) injection 40 mg (40 mg Intravenous Given 07/07/21 1722)  furosemide (LASIX) injection 40 mg (40 mg Intravenous Given 07/07/21 0403)    ED Course  I have reviewed the triage vital signs and the nursing notes.  Pertinent labs & imaging results that were available during my care of the patient were reviewed by me and considered in my medical decision making (see chart for details).    MDM Rules/Calculators/A&P                           Patient presents to the emergency department for evaluation of generalized weakness and shortness of breath.  Symptoms have been progressing over a period of approximately 1 week.  Patient appears to be volume overloaded, does have a history of congestive heart failure.  Patient will be admitted to the hospital for further management.  Final Clinical Impression(s) / ED Diagnoses Final diagnoses:  None  CHF  Rx / DC Orders ED Discharge Orders     None        Almarie Kurdziel, Gwenyth Allegra, MD 07/08/21 [phone]

## 2021-07-06 NOTE — ED Triage Notes (Signed)
Pt bib EMS from [facility] facility for weakness and SOB, worsening over the past few days. Now unable to ambulate self. Elevated WBC on his last lab work 10/21. COVID negative, also tested yesterday. Pt was placed on 2L nasal cannula chronically, starting yesterday per his primary physician. Hx chronic kidney disease. Alert and oriented x4.  280mL fluid given by EMS via 20G IV in L forearm  EMS vitals: BP 130/58 Entidal 30 HR 85 RR 30 CBNG 125

## 2021-07-07 ENCOUNTER — Observation Stay (HOSPITAL_COMMUNITY): Payer: Medicare HMO

## 2021-07-07 ENCOUNTER — Other Ambulatory Visit: Payer: Self-pay

## 2021-07-07 ENCOUNTER — Encounter (HOSPITAL_COMMUNITY): Payer: Self-pay | Admitting: Internal Medicine

## 2021-07-07 DIAGNOSIS — E039 Hypothyroidism, unspecified: Secondary | ICD-10-CM

## 2021-07-07 DIAGNOSIS — I5023 Acute on chronic systolic (congestive) heart failure: Secondary | ICD-10-CM | POA: Diagnosis present

## 2021-07-07 DIAGNOSIS — I4892 Unspecified atrial flutter: Secondary | ICD-10-CM | POA: Diagnosis not present

## 2021-07-07 DIAGNOSIS — I10 Essential (primary) hypertension: Secondary | ICD-10-CM

## 2021-07-07 DIAGNOSIS — I428 Other cardiomyopathies: Secondary | ICD-10-CM | POA: Diagnosis not present

## 2021-07-07 DIAGNOSIS — J9601 Acute respiratory failure with hypoxia: Secondary | ICD-10-CM | POA: Diagnosis not present

## 2021-07-07 DIAGNOSIS — N184 Chronic kidney disease, stage 4 (severe): Secondary | ICD-10-CM | POA: Diagnosis not present

## 2021-07-07 DIAGNOSIS — R531 Weakness: Secondary | ICD-10-CM | POA: Diagnosis not present

## 2021-07-07 DIAGNOSIS — J449 Chronic obstructive pulmonary disease, unspecified: Secondary | ICD-10-CM

## 2021-07-07 DIAGNOSIS — I509 Heart failure, unspecified: Secondary | ICD-10-CM | POA: Diagnosis not present

## 2021-07-07 DIAGNOSIS — R0602 Shortness of breath: Secondary | ICD-10-CM | POA: Diagnosis not present

## 2021-07-07 LAB — ECHOCARDIOGRAM COMPLETE
AR max vel: 1.61 cm2
AV Area VTI: 1.56 cm2
AV Area mean vel: 1.57 cm2
AV Mean grad: 4 mmHg
AV Peak grad: 7 mmHg
Ao pk vel: 1.32 m/s
Area-P 1/2: 2.07 cm2
Height: 64 in
P 1/2 time: 422 msec
S' Lateral: 4.9 cm
Weight: 2320 oz

## 2021-07-07 LAB — COMPREHENSIVE METABOLIC PANEL
ALT: 28 U/L (ref 0–44)
ALT: 30 U/L (ref 0–44)
AST: 39 U/L (ref 15–41)
AST: 36 U/L (ref 15–41)
Albumin: 2.3 g/dL — ABNORMAL LOW (ref 3.5–5.0)
Albumin: 2.2 g/dL — ABNORMAL LOW (ref 3.5–5.0)
Alkaline Phosphatase: 89 U/L (ref 38–126)
Alkaline Phosphatase: 85 U/L (ref 38–126)
Anion gap: 8 (ref 5–15)
Anion gap: 8 (ref 5–15)
BUN: 35 mg/dL — ABNORMAL HIGH (ref 8–23)
BUN: 34 mg/dL — ABNORMAL HIGH (ref 8–23)
CO2: 22 mmol/L (ref 22–32)
CO2: 22 mmol/L (ref 22–32)
Calcium: 7.8 mg/dL — ABNORMAL LOW (ref 8.9–10.3)
Calcium: 7.8 mg/dL — ABNORMAL LOW (ref 8.9–10.3)
Chloride: 103 mmol/L (ref 98–111)
Chloride: 103 mmol/L (ref 98–111)
Creatinine, Ser: 2.17 mg/dL — ABNORMAL HIGH (ref 0.61–1.24)
Creatinine, Ser: 2.05 mg/dL — ABNORMAL HIGH (ref 0.61–1.24)
GFR, Estimated: 30 mL/min — ABNORMAL LOW (ref >60)
GFR, Estimated: 32 mL/min — ABNORMAL LOW (ref >60)
Glucose, Bld: 151 mg/dL — ABNORMAL HIGH (ref 70–99)
Glucose, Bld: 118 mg/dL — ABNORMAL HIGH (ref 70–99)
Potassium: 4.8 mmol/L (ref 3.5–5.1)
Potassium: 4.8 mmol/L (ref 3.5–5.1)
Sodium: 133 mmol/L — ABNORMAL LOW (ref 135–145)
Sodium: 133 mmol/L — ABNORMAL LOW (ref 135–145)
Total Bilirubin: 0.4 mg/dL (ref 0.3–1.2)
Total Bilirubin: 0.6 mg/dL (ref 0.3–1.2)
Total Protein: 6.5 g/dL (ref 6.5–8.1)
Total Protein: 6.4 g/dL — ABNORMAL LOW (ref 6.5–8.1)

## 2021-07-07 LAB — CBC WITH DIFFERENTIAL/PLATELET
Abs Immature Granulocytes: 0.05 10*3/uL (ref 0.00–0.07)
Abs Immature Granulocytes: 0.07 10*3/uL (ref 0.00–0.07)
Basophils Absolute: 0 10*3/uL (ref 0.0–0.1)
Basophils Absolute: 0 10*3/uL (ref 0.0–0.1)
Basophils Relative: 0 %
Basophils Relative: 0 %
Eosinophils Absolute: 0.4 10*3/uL (ref 0.0–0.5)
Eosinophils Absolute: 0.4 10*3/uL (ref 0.0–0.5)
Eosinophils Relative: 3 %
Eosinophils Relative: 4 %
HCT: 32.6 % — ABNORMAL LOW (ref 39.0–52.0)
HCT: 34.1 % — ABNORMAL LOW (ref 39.0–52.0)
Hemoglobin: 10.3 g/dL — ABNORMAL LOW (ref 13.0–17.0)
Hemoglobin: 11 g/dL — ABNORMAL LOW (ref 13.0–17.0)
Immature Granulocytes: 1 %
Immature Granulocytes: 1 %
Lymphocytes Relative: 6 %
Lymphocytes Relative: 7 %
Lymphs Abs: 0.7 10*3/uL (ref 0.7–4.0)
Lymphs Abs: 0.8 10*3/uL (ref 0.7–4.0)
MCH: 31.8 pg (ref 26.0–34.0)
MCH: 32.3 pg (ref 26.0–34.0)
MCHC: 31.6 g/dL (ref 30.0–36.0)
MCHC: 32.3 g/dL (ref 30.0–36.0)
MCV: 100 fL (ref 80.0–100.0)
MCV: 100.6 fL — ABNORMAL HIGH (ref 80.0–100.0)
Monocytes Absolute: 1 10*3/uL (ref 0.1–1.0)
Monocytes Absolute: 1.3 10*3/uL — ABNORMAL HIGH (ref 0.1–1.0)
Monocytes Relative: 10 %
Monocytes Relative: 10 %
Neutro Abs: 8.3 10*3/uL — ABNORMAL HIGH (ref 1.7–7.7)
Neutro Abs: 9.7 10*3/uL — ABNORMAL HIGH (ref 1.7–7.7)
Neutrophils Relative %: 78 %
Neutrophils Relative %: 80 %
Platelets: 334 10*3/uL (ref 150–400)
Platelets: 341 10*3/uL (ref 150–400)
RBC: 3.24 MIL/uL — ABNORMAL LOW (ref 4.22–5.81)
RBC: 3.41 MIL/uL — ABNORMAL LOW (ref 4.22–5.81)
RDW: 12.5 % (ref 11.5–15.5)
RDW: 12.6 % (ref 11.5–15.5)
WBC: 10.5 10*3/uL (ref 4.0–10.5)
WBC: 12.2 10*3/uL — ABNORMAL HIGH (ref 4.0–10.5)
nRBC: 0 % (ref 0.0–0.2)
nRBC: 0 % (ref 0.0–0.2)

## 2021-07-07 LAB — LACTIC ACID, PLASMA: Lactic Acid, Venous: 1.5 mmol/L (ref 0.5–1.9)

## 2021-07-07 LAB — I-STAT ARTERIAL BLOOD GAS, ED
Acid-base deficit: 1 mmol/L (ref 0.0–2.0)
Bicarbonate: 22.2 mmol/L (ref 20.0–28.0)
Calcium, Ion: 1.03 mmol/L — ABNORMAL LOW (ref 1.15–1.40)
HCT: 27 % — ABNORMAL LOW (ref 39.0–52.0)
Hemoglobin: 9.2 g/dL — ABNORMAL LOW (ref 13.0–17.0)
O2 Saturation: 71 %
Patient temperature: 98.6
Potassium: 4.4 mmol/L (ref 3.5–5.1)
Sodium: 131 mmol/L — ABNORMAL LOW (ref 135–145)
TCO2: 23 mmol/L (ref 22–32)
pCO2 arterial: 28.6 mmHg — ABNORMAL LOW (ref 32.0–48.0)
pH, Arterial: 7.497 — ABNORMAL HIGH (ref 7.350–7.450)
pO2, Arterial: 33 mmHg — CL (ref 83.0–108.0)

## 2021-07-07 LAB — TROPONIN I (HIGH SENSITIVITY)
Troponin I (High Sensitivity): 42 ng/L — ABNORMAL HIGH (ref <18)
Troponin I (High Sensitivity): 47 ng/L — ABNORMAL HIGH (ref <18)

## 2021-07-07 LAB — URINALYSIS, ROUTINE W REFLEX MICROSCOPIC
Bilirubin Urine: NEGATIVE
Glucose, UA: NEGATIVE mg/dL
Hgb urine dipstick: NEGATIVE
Ketones, ur: NEGATIVE mg/dL
Leukocytes,Ua: NEGATIVE
Nitrite: NEGATIVE
Protein, ur: NEGATIVE mg/dL
Specific Gravity, Urine: 1.005 (ref 1.005–1.030)
pH: 5 (ref 5.0–8.0)

## 2021-07-07 LAB — C-REACTIVE PROTEIN: CRP: 20 mg/dL — ABNORMAL HIGH (ref <1.0)

## 2021-07-07 LAB — MAGNESIUM: Magnesium: 1.9 mg/dL (ref 1.7–2.4)

## 2021-07-07 LAB — RESP PANEL BY RT-PCR (FLU A&B, COVID) ARPGX2
Influenza A by PCR: NEGATIVE
Influenza B by PCR: NEGATIVE
SARS Coronavirus 2 by RT PCR: NEGATIVE

## 2021-07-07 LAB — PROCALCITONIN: Procalcitonin: <0.1 ng/mL

## 2021-07-07 LAB — BRAIN NATRIURETIC PEPTIDE: B Natriuretic Peptide: 901.6 pg/mL — ABNORMAL HIGH (ref 0.0–100.0)

## 2021-07-07 MED ORDER — LOSARTAN POTASSIUM 25 MG PO TABS
25.0000 mg | ORAL_TABLET | Freq: Every day | ORAL | Status: DC
  Administered 2021-07-07: 25 mg via ORAL
  Filled 2021-07-07: qty 1

## 2021-07-07 MED ORDER — AMIODARONE HCL 200 MG PO TABS
200.0000 mg | ORAL_TABLET | Freq: Every day | ORAL | Status: DC
  Administered 2021-07-07 – 2021-07-09 (×3): 200 mg via ORAL
  Filled 2021-07-07 (×3): qty 1

## 2021-07-07 MED ORDER — FUROSEMIDE 10 MG/ML IJ SOLN
40.0000 mg | Freq: Two times a day (BID) | INTRAMUSCULAR | Status: DC
  Administered 2021-07-07 – 2021-07-08 (×3): 40 mg via INTRAVENOUS
  Filled 2021-07-07 (×3): qty 4

## 2021-07-07 MED ORDER — LEVOTHYROXINE SODIUM 75 MCG PO TABS
150.0000 ug | ORAL_TABLET | Freq: Every day | ORAL | Status: DC
  Administered 2021-07-07 – 2021-07-09 (×3): 150 ug via ORAL
  Filled 2021-07-07 (×3): qty 2

## 2021-07-07 MED ORDER — FUROSEMIDE 10 MG/ML IJ SOLN
40.0000 mg | Freq: Once | INTRAMUSCULAR | Status: AC
  Administered 2021-07-07: 40 mg via INTRAVENOUS
  Filled 2021-07-07: qty 4

## 2021-07-07 MED ORDER — ISOSORBIDE MONONITRATE ER 30 MG PO TB24
30.0000 mg | ORAL_TABLET | Freq: Every morning | ORAL | Status: DC
  Administered 2021-07-07 – 2021-07-09 (×3): 30 mg via ORAL
  Filled 2021-07-07 (×3): qty 1

## 2021-07-07 MED ORDER — ACETAMINOPHEN 325 MG PO TABS: 650.0000 mg | ORAL_TABLET | ORAL | Status: DC | PRN

## 2021-07-07 MED ORDER — APIXABAN 2.5 MG PO TABS
2.5000 mg | ORAL_TABLET | Freq: Two times a day (BID) | ORAL | Status: DC
  Administered 2021-07-07 – 2021-07-09 (×5): 2.5 mg via ORAL
  Filled 2021-07-07 (×5): qty 1

## 2021-07-07 MED ORDER — HYDRALAZINE HCL 25 MG PO TABS
25.0000 mg | ORAL_TABLET | Freq: Three times a day (TID) | ORAL | Status: DC
  Administered 2021-07-07 – 2021-07-09 (×4): 25 mg via ORAL
  Filled 2021-07-07 (×6): qty 1

## 2021-07-07 MED ORDER — HYDRALAZINE HCL 20 MG/ML IJ SOLN
10.0000 mg | Freq: Four times a day (QID) | INTRAMUSCULAR | Status: DC | PRN
  Filled 2021-07-07: qty 1

## 2021-07-07 MED ORDER — ASPIRIN EC 81 MG PO TBEC
81.0000 mg | DELAYED_RELEASE_TABLET | Freq: Every day | ORAL | Status: DC
  Administered 2021-07-07 – 2021-07-08 (×2): 81 mg via ORAL
  Filled 2021-07-07 (×2): qty 1

## 2021-07-07 NOTE — Consult Note (Signed)
Cardiology Consultation:   Patient ID: [name] MRN: [id number]; DOB: 06-11-1936  Admit date: 07/06/2021 Date of Consult: 07/07/2021  PCP:  [name], MD   [facility] Providers Cardiologist:  None  Advanced Heart Failure:  Glori Bickers, MD  {  Patient Profile:   [name] is a 85 y.o. male with a hx of cognitive impairment, CKD stage IV, HFrEF due to NICM, atrial flutter status post cardioversion April 2012, chronic left bundle branch block, COPD who is being seen 07/07/2021 for the evaluation of CHF at the request of Dr. Maylene Roes.  History of Present Illness:   [name] is an 85 year old male with past medical history noted above.  He has been followed by Dr. Haroldine Laws in the advanced heart failure clinic.  Underwent cardiac catheterization 12/2010 which showed nonobstructive CAD.  Thereafter underwent TEE guided cardioversion, EF of 15 to 20% with successful cardioversion to sinus rhythm.  He was started on amiodarone to maintain sinus rhythm.  Given his baseline left bundle branch block the possibility of BiV ICD was discussed but decided to defer as he was doing well.  Echocardiogram 07/2019 noted EF of 40 to 45%, mild to moderate AI.  He was most recently seen in the office on 03/04/2021 and reported doing well.  He currently resides at [facility].  At this office visit his volume status was noted to be well controlled.  Weight was 139 pounds.  He was continued on hydralazine 25 3 times daily, Imdur 30 mg daily, losartan 50 mg daily and spironolactone.  Not on beta-blocker therapy secondary to bradycardia, not wanting to switch to [facility] due to cost.  Presented to the ED on 10/22 with shortness of breath. He notes that this has been progressive since he has been in [place]. No clear sudden decline that he is aware of. Denies chest pain. Wears compression stockings at home routinely, has not noted increased swelling recently. Lives at SNF,  does not know his weight.  He does endorse eating a lot of processed food at his SNF, including bacon, ham, french fries, and little debbie cakes.  Denies chest pain. No PND, orthopnea, LE edema or unexpected weight gain that he has noticed. No syncope or palpitations.    Past Medical History:  Diagnosis Date   Arthritis    Atrial fibrillation (HCC)    Atrial flutter (HCC)    Cardiomyopathy    Nonischemic, LVEF 20%   CHF (congestive heart failure) (HCC)    Chronic renal insufficiency    COPD (chronic obstructive pulmonary disease) ([place])    Fracture of unspecified part of left clavicle, initial encounter for closed fracture    Hypothyroidism    Left ankle sprain    Left bundle branch block    Mixed hyperlipidemia    Renal failure    Scoliosis     Past Surgical History:  Procedure Laterality Date   EYE SURGERY     LAPAROSCOPIC CHOLECYSTECTOMY  2008   left foot surgery  1998   MASS EXCISION Left 06/26/2016   Procedure: EXCISION Mucoid tumor;  Surgeon: Daryll Brod, MD;  Location: [place];  Service: Orthopedics;  Laterality: Left;  FAB   Right inguinal herniorrhaphy     ROTATION FLAP Left 06/26/2016   Procedure: debridement of distal interphalangeal possible, ROTATION FLAP left index finger;  Surgeon: Daryll Brod, MD;  Location: [place];  Service: Orthopedics;  Laterality: Left;   SKIN CANCER REMOVED  LEFT EAR  TONSILLECTOMY       Home Medications:  Prior to Admission medications   Medication Sig Start Date End Date Taking? Authorizing Provider  amiodarone (PACERONE) 200 MG tablet TAKE 1 TABLET BY MOUTH ONCE DAILY Patient taking differently: Take 200 mg by mouth daily. 01/05/18  Yes Bensimhon, Shaune Pascal, MD  ammonium lactate (AMLACTIN) 12 % cream Apply 1 application topically as needed for dry skin.   Yes [provider]  apixaban (ELIQUIS) 2.5 MG TABS tablet Take 1 tablet (2.5 mg total) by mouth 2 (two) times daily. Please cancel all previous orders for current  medication. Change in dosage or pill size. 11/09/18  Yes Bensimhon, Shaune Pascal, MD  aspirin EC 81 MG tablet Take 81 mg by mouth daily.   Yes [provider]  furosemide (LASIX) 40 MG tablet Take 40 mg by mouth daily.   Yes [provider]  hydrALAZINE (APRESOLINE) 25 MG tablet TAKE 1 TABLET BY MOUTH THREE TIMES DAILY Patient taking differently: Take 25 mg by mouth 3 (three) times daily. 12/23/17  Yes Bensimhon, Shaune Pascal, MD  hydrocortisone ointment 0.5 % Apply 1 application topically 2 (two) times daily as needed for itching. Right thumb   Yes [provider]  hydrocortisone valerate cream (WESTCORT) 0.2 % Apply 1 application topically 2 (two) times daily. To affected areas of ear, scalp, hair, and nostrils/nose   Yes [provider]  isosorbide mononitrate (IMDUR) 30 MG 24 hr tablet TAKE 1 TABLET BY MOUTH ONCE DAILY IN THE MORNING Patient taking differently: Take 30 mg by mouth daily. 09/10/17  Yes Bensimhon, Shaune Pascal, MD  ketoconazole (NIZORAL) 2 % cream Apply 1 application topically 2 (two) times daily. To affected areas of ear, scalp, hair, and nostrils/nose   Yes [provider]  ketoconazole (NIZORAL) 2 % shampoo Apply 1 application topically 2 (two) times a week. With hair washing on Wednesday's and Saturday's   Yes [provider]  levothyroxine (SYNTHROID) 150 MCG tablet Take 150 mcg by mouth daily before breakfast.   Yes [provider]  loratadine (CLARITIN) 10 MG tablet Take 10 mg by mouth daily.   Yes [provider]  losartan (COZAAR) 25 MG tablet TAKE 1 TABLET BY MOUTH ONCE DAILY Patient taking differently: Take 25 mg by mouth daily. 12/29/18  Yes Reed, Tiffany L, DO  polyethylene glycol (MIRALAX / GLYCOLAX) 17 g packet Take 17 g by mouth daily as needed for mild constipation.   Yes [provider]  spironolactone (ALDACTONE) 25 MG tablet TAKE ONE TABLET BY MOUTH ONCE DAILY Patient taking differently: Take 25  mg by mouth once. 02/03/17  Yes Bensimhon, Shaune Pascal, MD    Inpatient Medications: Scheduled Meds:  amiodarone  200 mg Oral Daily   apixaban  2.5 mg Oral BID   aspirin EC  81 mg Oral Daily   furosemide  40 mg Intravenous BID   hydrALAZINE  25 mg Oral TID   isosorbide mononitrate  30 mg Oral q morning   levothyroxine  150 mcg Oral Q0600   losartan  25 mg Oral Daily   Continuous Infusions:  PRN Meds: acetaminophen, hydrALAZINE  Allergies:   No Known Allergies  Social History:   Social History   Socioeconomic History   Marital status: Married    Spouse name: Not on file   Number of children: Not on file   Years of education: Not on file   Highest education level: Not on file  Occupational History   Occupation: Retired  Comment: Previously worked at [employer] Use   Smoking status: Never   Smokeless tobacco: Never  Vaping Use   Vaping Use: Never used  Substance and Sexual Activity   Alcohol use: Not Currently   Drug use: No   Sexual activity: Not on file  Other Topics Concern   Not on file  Social History Narrative   Tobacco use, amount per day now: No      Past tobacco use, amount per day: No      How many years did you use tobacco: No      Alcohol use (drinks per week): No      Diet: Low Salt       Do you drink/eat things with caffeine? Tea      Marital status: Married             What year were you married? 1975      Do you live in a house, apartment, assisted living, condo, trailer?      Is it one or more stories?      How many persons live in your home?      Do you have any pets in your home?      Current or past profession?      Do you exercise?             How often?      Do you have a living will? Yes      Do you have a DNR form? Yes           If not, do you want to discuss one?      Do you have signed POA/HPOA forms? Yes              Social Determinants of Health   Financial Resource Strain: Not on file  Food Insecurity:  Not on file  Transportation Needs: Not on file  Physical Activity: Not on file  Stress: Not on file  Social Connections: Not on file  Intimate Partner Violence: Not on file    Family History:    Family History  Problem Relation Age of Onset   Heart disease Father        Died age 31   Stroke Mother        Died age 7   Diabetes Mother    Pancreatic cancer Brother    Arthritis Brother    Throat cancer Brother    Arthritis Brother    Diabetes Sister    Seizures Sister      ROS:  Please see the history of present illness.  All other ROS reviewed and negative.     Physical Exam/Data:   Vitals:   07/07/21 1430 07/07/21 1448 07/07/21 1500 07/07/21 1545  BP: (!) 100/47  (!) 110/42 (!) 112/46  Pulse: 62  64 67  Resp: (!) 25  (!) 26 18  Temp:  98.3 F (36.8 C)    TempSrc:  Oral    SpO2: 98%  98% 100%  Weight:    60.4 kg  Height:    5\' 4"  (1.626 m)    Intake/Output Summary (Last 24 hours) at 07/07/2021 1633 Last data filed at 07/07/2021 1500 Gross per 24 hour  Intake 470 ml  Output 2300 ml  Net -1830 ml   Last 3 Weights 07/07/2021 07/06/2021 04/08/2021  Weight (lbs) 133 lb 2.5 oz 145 lb 138 lb  Weight (kg) 60.4 kg 65.772 kg 62.596 kg  Body mass index is 22.86 kg/m.  General: Frail appearing elderly gentleman, curled in the bed, neck pillow supporting his head, [place] in place HEENT: normal Neck: JVD elevated to mid-upper neck at 45 degrees, ~13 cm Vascular: No carotid bruits; Distal pulses 2+ bilaterally Cardiac:  normal S1, S2; RRR; no murmur appreciated Lungs:  diffuse rhonchi and rales in bilateral lungs Abd: soft, nontender, no hepatomegaly  Ext: trivial bilateral LE edema Musculoskeletal:  No deformities Skin: warm and dry  Neuro:  speech is stilted but comprehensible, appropriate response to questions Psych:  Normal affect   EKG:  The EKG was personally reviewed and demonstrates:  SR, LBBB Telemetry:  Telemetry was personally reviewed and demonstrates:   SR with LBBB  Relevant CV Studies:  Echo: 07/07/21  IMPRESSIONS     1. Left ventricular ejection fraction, by estimation, is 30 to 35%. The  left ventricle has moderately decreased function. The left ventricle  demonstrates regional wall motion abnormalities (see scoring  diagram/findings for description). The left  ventricular internal cavity size was mildly dilated. Left ventricular  diastolic parameters are consistent with Grade II diastolic dysfunction  (pseudonormalization). Elevated left ventricular end-diastolic pressure.   2. Right ventricular systolic function is mildly reduced. The right  ventricular size is normal.   3. Left atrial size was moderately dilated.   4. Right atrial size was mild to moderately dilated.   5. The mitral valve is normal in structure. Mild mitral valve  regurgitation. No evidence of mitral stenosis.   6. The aortic valve was not well visualized. Aortic valve regurgitation  is mild to moderate.   Comparison(s): Changes from prior study are noted.   Conclusion(s)/Recommendation(s): EF reduced compared to prior. Global  hypokinesis with akinesis/dyskinesis of the entire septum, out of  proportion to dyssychrony from LBBB.   FINDINGS   Left Ventricle: Left ventricular ejection fraction, by estimation, is 30  to 35%. The left ventricle has moderately decreased function. The left  ventricle demonstrates regional wall motion abnormalities. The left  ventricular internal cavity size was  mildly dilated. There is no left ventricular hypertrophy. Abnormal  (paradoxical) septal motion, consistent with left bundle branch block.  Left ventricular diastolic parameters are consistent with Grade II  diastolic dysfunction (pseudonormalization).  Elevated left ventricular end-diastolic pressure.      LV Wall Scoring:  The anterior septum is dyskinetic. The inferior septum is akinetic. The  entire anterior wall, entire lateral wall, entire inferior wall,  and apex  are  hypokinetic.   Right Ventricle: The right ventricular size is normal. Right vetricular  wall thickness was not well visualized. Right ventricular systolic  function is mildly reduced.   Left Atrium: Left atrial size was moderately dilated.   Right Atrium: Right atrial size was mild to moderately dilated.   Pericardium: There is no evidence of pericardial effusion.   Mitral Valve: The mitral valve is normal in structure. Mild mitral valve  regurgitation. No evidence of mitral valve stenosis.   Tricuspid Valve: The tricuspid valve is normal in structure. Tricuspid  valve regurgitation is trivial. No evidence of tricuspid stenosis.   Aortic Valve: The aortic valve was not well visualized. Aortic valve  regurgitation is mild to moderate. Aortic regurgitation PHT measures 422  msec. Aortic valve mean gradient measures 4.0 mmHg. Aortic valve peak  gradient measures 7.0 mmHg. Aortic valve  area, by VTI measures 1.56 cm.   Pulmonic Valve: The pulmonic valve was not well visualized. Pulmonic valve  regurgitation is not visualized.  Aorta: The aortic root is normal in size and structure, the aortic arch  was not well visualized and the ascending aorta was not well visualized.   IAS/Shunts: The atrial septum is grossly normal.   Laboratory Data:  High Sensitivity Troponin:   Recent Labs  Lab 07/06/21 2344 07/07/21 0130  TROPONINIHS 42* 47*     Chemistry Recent Labs  Lab 07/06/21 2344 07/07/21 0050  NA 133* 131*  K 4.8 4.4  CL 103  --   CO2 22  --   GLUCOSE 151*  --   BUN 35*  --   CREATININE 2.17*  --   CALCIUM 7.8*  --   GFRNONAA 30*  --   ANIONGAP 8  --     Recent Labs  Lab 07/06/21 2344  PROT 6.5  ALBUMIN 2.3*  AST 39  ALT 28  ALKPHOS 89  BILITOT 0.4   Lipids No results for input(s): CHOL, TRIG, HDL, LABVLDL, LDLCALC, CHOLHDL in the last 168 hours.  Hematology Recent Labs  Lab 07/06/21 2344 07/07/21 0050 07/07/21 0425  WBC 10.5  --   12.2*  RBC 3.24*  --  3.41*  HGB 10.3* 9.2* 11.0*  HCT 32.6* 27.0* 34.1*  MCV 100.6*  --  100.0  MCH 31.8  --  32.3  MCHC 31.6  --  32.3  RDW 12.6  --  12.5  PLT 334  --  341   Thyroid No results for input(s): TSH, FREET4 in the last 168 hours.  BNP Recent Labs  Lab 07/06/21 2344  BNP 901.6*    DDimer No results for input(s): DDIMER in the last 168 hours.   Radiology/Studies:  DG Chest Port 1 View  Result Date: 07/07/2021 CLINICAL DATA:  Weakness and shortness of breath. EXAM: PORTABLE CHEST 1 VIEW COMPARISON:  October 22, 2017 FINDINGS: The lungs are hyperinflated. Diffuse, chronic appearing increased interstitial lung markings are seen. Mild areas of atelectasis and/or infiltrate are seen involving the mid left lung, bilateral lung bases and right upper lobe. There is no evidence of a pleural effusion or pneumothorax. Evaluation of the superior mediastinum is limited secondary to patient positioning. The cardiac silhouette is moderately enlarged. Radiopaque surgical clips are seen within the left upper quadrant. Degenerative changes seen throughout the thoracic spine. IMPRESSION: Chronic appearing increased interstitial lung markings with mild areas of bilateral atelectasis and/or infiltrate. Electronically Signed   By: Virgina Norfolk M.D.   On: 07/07/2021 00:11   ECHOCARDIOGRAM COMPLETE  Result Date: 07/07/2021    ECHOCARDIOGRAM REPORT   Patient Name:   [name] Date of Exam: 07/07/2021 Medical Rec #:  [id number]        Height:       64.0 in Accession #:    [id number]       Weight:       145.0 lb Date of Birth:  [birth year]       BSA:          1.706 m Patient Age:    64 years         BP:           107/55 mmHg Patient Gender: M                HR:           72 bpm. Exam Location:  Inpatient Procedure: 2D Echo, Cardiac Doppler and Color Doppler Indications:    CHF  History:        Patient has prior history  of Echocardiogram examinations, most                 recent 07/28/2019.  Arrythmias:LBBB; Risk Factors:Dyslipidemia.  Sonographer:    Maudry Mayhew Groves, RVT, RDCS Referring Phys: [id number] [facility]  Sonographer Comments: No subcostal window. IMPRESSIONS  1. Left ventricular ejection fraction, by estimation, is 30 to 35%. The left ventricle has moderately decreased function. The left ventricle demonstrates regional wall motion abnormalities (see scoring diagram/findings for description). The left ventricular internal cavity size was mildly dilated. Left ventricular diastolic parameters are consistent with Grade II diastolic dysfunction (pseudonormalization). Elevated left ventricular end-diastolic pressure.  2. Right ventricular systolic function is mildly reduced. The right ventricular size is normal.  3. Left atrial size was moderately dilated.  4. Right atrial size was mild to moderately dilated.  5. The mitral valve is normal in structure. Mild mitral valve regurgitation. No evidence of mitral stenosis.  6. The aortic valve was not well visualized. Aortic valve regurgitation is mild to moderate. Comparison(s): Changes from prior study are noted. Conclusion(s)/Recommendation(s): EF reduced compared to prior. Global hypokinesis with akinesis/dyskinesis of the entire septum, out of proportion to dyssychrony from LBBB. FINDINGS  Left Ventricle: Left ventricular ejection fraction, by estimation, is 30 to 35%. The left ventricle has moderately decreased function. The left ventricle demonstrates regional wall motion abnormalities. The left ventricular internal cavity size was mildly dilated. There is no left ventricular hypertrophy. Abnormal (paradoxical) septal motion, consistent with left bundle branch block. Left ventricular diastolic parameters are consistent with Grade II diastolic dysfunction (pseudonormalization). Elevated left ventricular end-diastolic pressure.  LV Wall Scoring: The anterior septum is dyskinetic. The inferior septum is akinetic. The entire anterior  wall, entire lateral wall, entire inferior wall, and apex are hypokinetic. Right Ventricle: The right ventricular size is normal. Right vetricular wall thickness was not well visualized. Right ventricular systolic function is mildly reduced. Left Atrium: Left atrial size was moderately dilated. Right Atrium: Right atrial size was mild to moderately dilated. Pericardium: There is no evidence of pericardial effusion. Mitral Valve: The mitral valve is normal in structure. Mild mitral valve regurgitation. No evidence of mitral valve stenosis. Tricuspid Valve: The tricuspid valve is normal in structure. Tricuspid valve regurgitation is trivial. No evidence of tricuspid stenosis. Aortic Valve: The aortic valve was not well visualized. Aortic valve regurgitation is mild to moderate. Aortic regurgitation PHT measures 422 msec. Aortic valve mean gradient measures 4.0 mmHg. Aortic valve peak gradient measures 7.0 mmHg. Aortic valve area, by VTI measures 1.56 cm. Pulmonic Valve: The pulmonic valve was not well visualized. Pulmonic valve regurgitation is not visualized. Aorta: The aortic root is normal in size and structure, the aortic arch was not well visualized and the ascending aorta was not well visualized. IAS/Shunts: The atrial septum is grossly normal.  LEFT VENTRICLE PLAX 2D LVIDd:         5.75 cm   Diastology LVIDs:         4.90 cm   LV e' medial:    4.12 cm/s LV PW:         0.70 cm   LV E/e' medial:  30.3 LV IVS:        0.60 cm   LV e' lateral:   8.93 cm/s LVOT diam:     1.70 cm   LV E/e' lateral: 14.0 LV SV:         44 LV SV Index:   26 LVOT Area:     2.27 cm  RIGHT  VENTRICLE TAPSE (M-mode): 1.5 cm LEFT ATRIUM             Index        RIGHT ATRIUM           Index LA diam:        3.50 cm 2.05 cm/m   RA Area:     17.20 cm LA Vol (A2C):   48.9 ml 28.66 ml/m  RA Volume:   48.10 ml  28.19 ml/m LA Vol (A4C):   73.1 ml 42.84 ml/m LA Biplane Vol: 59.9 ml 35.10 ml/m  AORTIC VALVE AV Area (Vmax):    1.61 cm AV Area  (Vmean):   1.57 cm AV Area (VTI):     1.56 cm AV Vmax:           132.00 cm/s AV Vmean:          85.500 cm/s AV VTI:            0.280 m AV Peak Grad:      7.0 mmHg AV Mean Grad:      4.0 mmHg LVOT Vmax:         93.80 cm/s LVOT Vmean:        59.100 cm/s LVOT VTI:          0.192 m LVOT/AV VTI ratio: 0.69 AI PHT:            422 msec  AORTA Ao Root diam: 3.10 cm MITRAL VALVE                TRICUSPID VALVE MV Area (PHT): 2.07 cm     TR Peak grad:   12.4 mmHg MV Decel Time: 366 msec     TR Vmax:        176.00 cm/s MV E velocity: 124.67 cm/s MV A velocity: 93.60 cm/s   SHUNTS MV E/A ratio:  1.33         Systemic VTI:  0.19 m                             Systemic Diam: 1.70 cm Buford Dresser MD Electronically signed by Buford Dresser MD Signature Date/Time: 07/07/2021/2:22:20 PM    Final      Assessment and Plan:   [name] is a 85 y.o. male with a hx of cognitive impairment, CKD stage IV, HFrEF due to NICM, atrial flutter status post cardioversion April 2012, chronic left bundle branch block, COPD who is being seen 07/07/2021 for the evaluation of CHF at the request of Dr. Maylene Roes.  He presented with acute on chronic systolic and diastolic heart failure, likely due to high sodium diet. His echo this admission shows reduction in his EF and a change in his wall motion pattern.   We discussed his echo findings and options for further management. Will proceed with diuresis. Limited options for guideline directed medical therapy given his chronic kidney disease stage 4.  Would not proceed with coronary angiography at this time given his renal function. Previously noted to be nonischemic, though last cath ~10 years ago. He would like to avoid procedures, which I agree with.  He has already diuresed <2L, would continue IV lasix 40 mg BID. May need to increase dose if he stops responding given his renal disease.  Continue apixaban reduced dose (age, renal function), amiodarone, aspirin,  hydralazine and imdur. Low threshold to stop losartan if renal function worsens.   Risk Assessment/Risk Scores:  New York Heart Association (NYHA) Functional Class NYHA Class III        For questions or updates, please contact [facility] Please consult [email or web address] for contact info under    Signed, Buford Dresser, MD  07/07/2021 4:33 PM

## 2021-07-07 NOTE — Assessment & Plan Note (Signed)
   No clinical evidence of COPD exacerbation this time  As needed bronchodilator therapy for shortness of breath and wheezing.  

## 2021-07-07 NOTE — Progress Notes (Signed)
  PROGRESS NOTE  Patient admitted earlier this morning. See H&P.   Patient presented with chief complaint of shortness of breath and weakness.  He currently resides at [facility].  Was found to need new oxygen requirement as outpatient, in the days of follow-up patient's symptoms continue to worsen.  In the emergency department, BNP elevated 901, with patchy bilateral infiltrates on chest x-ray concerning for pulmonary edema.  He was given IV Lasix and admitted for acute on chronic systolic heart failure exacerbation.  Patient sitting in bed, drinking orange juice.  He admits to drinking a lot of fluids as outpatient to keep up his blood pressure.  His usual meals at SNF consists of soup, ham sandwiches.  On physical examination, patient is on 2 L nasal cannula O2, does not have overt peripheral edema.  -Echocardiogram shows EF 30 to 36%, grade 2 diastolic dysfunction, with left ventricle regional wall motion abnormalities (was 40-45% in 07/2019)  -Continue IV Lasix (takes lasix 40mg  PO daily PTA) -Continue nasal cannula O2 -Strict I's and O's, daily weight, fluid restriction diet -Consult cardiology today.  Patient follows with Dr. Haroldine Laws as outpatient and was previously seen in June 2022 (was noted to decline ICD at that time)   Status is: Observation  The patient will require care spanning > 2 midnights and should be moved to inpatient because: Remains on IV Lasix, cardiology consulted today.       Dessa Phi, DO [facility] 07/07/2021, 2:34 PM  Available via Epic secure chat 7am-7pm After these hours, please refer to coverage provider listed on [email or web address]

## 2021-07-07 NOTE — Assessment & Plan Note (Signed)
   Patient has exhibited hypoxia with new oxygen requirement resulting in patient being placed on submental oxygen in the outpatient setting  Based on patient's presentation and physical exam I feel this is most likely secondary to acute on chronic congestive heart failure  However, due to the appearance of the patient's bilateral infiltrates on chest x-ray, will additionally obtain procalcitonin and CRP.  Managing congestive heart failure as noted above

## 2021-07-07 NOTE — H&P (Addendum)
History and Physical    [name] KZL:[id number] DOB: November 10, 1935 DOA: 07/06/2021  PCP: [name], MD  Patient coming from: SNF   Chief Complaint:  Chief Complaint  Patient presents with   Shortness of Breath   Weakness     HPI:    85 year old male with past medical history of chronic kidney disease stage IV, systolic congestive heart failure (Echo 07/2019 EF as low as 15-20% but latest 40-45%), atrial flutter (S/P DCCV 12/2010), hypertension, hypothyroidism, left bundle branch block and chronic kidney disease stage IV presents to [facility] emergency department via EMS with shortness of breath and weakness.  Patient explains that for at least the past week he has been developing increasing shortness of breath.  Shortness of breath progressively became more more severe as the days progressed.  Shortness breath is worse with exertion and improved with rest.  Symptoms have additionally been associated with ongoing nonproductive cough and generalized weakness.  Patient denies any fevers, sick contacts or recent travel.  Over the past several days, patient was recently evaluated by his outpatient provider who identified the patient had a new oxygen requirement and placed him on supplemental oxygen via nasal cannula.  The days that followed, patient symptoms continued to worsen.  EMS was eventually contacted and the patient was promptly brought into [facility] emergency department for evaluation.  Upon evaluation in the emergency department patient was found to have a markedly elevated BNP of 901 with evidence of bilateral patchy infiltrates on chest x-ray concerning for pulmonary edema.  Patient was administered 40 mg of intravenous Lasix for suspected acute congestive heart failure and the hospitalist group was then called to assess the patient for admission to the hospital.  Review of Systems:   Review of Systems  Respiratory:  Positive for cough and shortness  of breath.   Cardiovascular:  Positive for leg swelling.  All other systems reviewed and are negative.  Past Medical History:  Diagnosis Date   Arthritis    Atrial fibrillation (HCC)    Atrial flutter (HCC)    Cardiomyopathy    Nonischemic, LVEF 20%   CHF (congestive heart failure) (HCC)    Chronic renal insufficiency    COPD (chronic obstructive pulmonary disease) ([place])    Fracture of unspecified part of left clavicle, initial encounter for closed fracture    Hypothyroidism    Left ankle sprain    Left bundle branch block    Mixed hyperlipidemia    Renal failure    Scoliosis     Past Surgical History:  Procedure Laterality Date   EYE SURGERY     LAPAROSCOPIC CHOLECYSTECTOMY  2008   left foot surgery  1998   MASS EXCISION Left 06/26/2016   Procedure: EXCISION Mucoid tumor;  Surgeon: Daryll Brod, MD;  Location: [place];  Service: Orthopedics;  Laterality: Left;  FAB   Right inguinal herniorrhaphy     ROTATION FLAP Left 06/26/2016   Procedure: debridement of distal interphalangeal possible, ROTATION FLAP left index finger;  Surgeon: Daryll Brod, MD;  Location: [facility];  Service: Orthopedics;  Laterality: Left;   SKIN CANCER REMOVED  LEFT EAR     TONSILLECTOMY       reports that he has never smoked. He has never used smokeless tobacco. He reports that he does not currently use alcohol. He reports that he does not use drugs.  No Known Allergies  Family History  Problem Relation Age of Onset   Heart disease Father  Died age 19   Stroke Mother        Died age 11   Diabetes Mother    Pancreatic cancer Brother    Arthritis Brother    Throat cancer Brother    Arthritis Brother    Diabetes Sister    Seizures Sister      Prior to Admission medications   Medication Sig Start Date End Date Taking? Authorizing Provider  amiodarone (PACERONE) 200 MG tablet TAKE 1 TABLET BY MOUTH ONCE DAILY 01/05/18   Bensimhon, Shaune Pascal, MD  ammonium lactate (AMLACTIN) 12 % cream Apply  topically as needed for dry skin.    [provider]  apixaban (ELIQUIS) 2.5 MG TABS tablet Take 1 tablet (2.5 mg total) by mouth 2 (two) times daily. Please cancel all previous orders for current medication. Change in dosage or pill size. 11/09/18   Bensimhon, Shaune Pascal, MD  aspirin EC 81 MG tablet Take 81 mg by mouth daily.    [provider]  furosemide (LASIX) 40 MG tablet Take 40 mg by mouth daily.    [provider]  hydrALAZINE (APRESOLINE) 25 MG tablet TAKE 1 TABLET BY MOUTH THREE TIMES DAILY 12/23/17   Bensimhon, Shaune Pascal, MD  hydrocortisone ointment 0.5 % Apply 1 application topically 2 (two) times daily as needed for itching. Right thumb    [provider]  hydrocortisone valerate cream (WESTCORT) 0.2 % Apply 1 application topically 2 (two) times daily.    [provider]  isosorbide mononitrate (IMDUR) 30 MG 24 hr tablet TAKE 1 TABLET BY MOUTH ONCE DAILY IN THE MORNING 09/10/17   Bensimhon, Shaune Pascal, MD  ketoconazole (NIZORAL) 2 % cream Apply 1 application topically 2 (two) times daily.    [provider]  ketoconazole (NIZORAL) 2 % shampoo Apply 1 application topically 2 (two) times a week.    [provider]  levothyroxine (SYNTHROID) 150 MCG tablet Take 150 mcg by mouth daily before breakfast.    [provider]  loratadine (CLARITIN) 10 MG tablet Take 10 mg by mouth daily.    [provider]  losartan (COZAAR) 25 MG tablet TAKE 1 TABLET BY MOUTH ONCE DAILY 12/29/18   Reed, Tiffany L, DO  polyethylene glycol (MIRALAX / GLYCOLAX) 17 g packet Take 17 g by mouth daily as needed.    [provider]  spironolactone (ALDACTONE) 25 MG tablet TAKE ONE TABLET BY MOUTH ONCE DAILY 02/03/17   Bensimhon, Shaune Pascal, MD    Physical Exam: Vitals:   07/07/21 0200 07/07/21 0215 07/07/21 0245 07/07/21 0330  BP: (!) 114/48 (!) 108/50 (!) 116/52 (!) 129/52  Pulse: 66 63 68 74  Resp: (!) 26 (!) 22 18 (!) 31  Temp:       TempSrc:      SpO2: 96% 97% 97% 98%  Weight:      Height:        Constitutional: Awake alert and oriented x3, patient is in mild respiratory distress Skin: no rashes, no lesions, good skin turgor noted. Eyes: Pupils are equally reactive to light.  No evidence of scleral icterus or conjunctival pallor.  ENMT: Moist mucous membranes noted.  Posterior pharynx clear of any exudate or lesions.   Neck: normal, supple, no masses, no thyromegaly.  Notable jugular venous distention at 45 degrees.   Respiratory: Diffuse rales in the bilateral lung fields.  No evidence of associated wheezing.  Patient is exhibiting increased respiratory effort without evidence of accessory muscle use.  Cardiovascular: Tachycardic rate with regular rhythm, no murmurs / rubs / gallops.  +1 pitting edema of the distal bilateral lower extremities.  2+ pedal pulses. No carotid bruits.  Chest:   Nontender without crepitus or deformity.   Back:   Nontender without crepitus or deformity. Abdomen: Abdomen is soft and nontender.  No evidence of intra-abdominal masses.  Positive bowel sounds noted in all quadrants.   Musculoskeletal: No joint deformity upper and lower extremities. Good ROM, no contractures. Normal muscle tone.  Neurologic: CN 2-12 grossly intact. Sensation intact.  Patient moving all 4 extremities spontaneously.  Patient is following all commands.  Patient is responsive to verbal stimuli.   Psychiatric: Patient exhibits normal mood with appropriate affect.  Patient seems to possess insight as to their current situation.     Labs on Admission: I have personally reviewed following labs and imaging studies -   CBC: Recent Labs  Lab 07/06/21 2344  WBC 10.5  NEUTROABS 8.3*  HGB 10.3*  HCT 32.6*  MCV 100.6*  PLT 790   Basic Metabolic Panel: Recent Labs  Lab 07/06/21 2344  NA 133*  K 4.8  CL 103  CO2 22  GLUCOSE 151*  BUN 35*  CREATININE 2.17*  CALCIUM 7.8*   GFR: Estimated Creatinine  Clearance: 22 mL/min (A) (by C-G formula based on SCr of 2.17 mg/dL (H)). Liver Function Tests: Recent Labs  Lab 07/06/21 2344  AST 39  ALT 28  ALKPHOS 89  BILITOT 0.4  PROT 6.5  ALBUMIN 2.3*   No results for input(s): LIPASE, AMYLASE in the last 168 hours. No results for input(s): AMMONIA in the last 168 hours. Coagulation Profile: No results for input(s): INR, PROTIME in the last 168 hours. Cardiac Enzymes: No results for input(s): CKTOTAL, CKMB, CKMBINDEX, TROPONINI in the last 168 hours. BNP (last 3 results) No results for input(s): PROBNP in the last 8760 hours. HbA1C: No results for input(s): HGBA1C in the last 72 hours. CBG: No results for input(s): GLUCAP in the last 168 hours. Lipid Profile: No results for input(s): CHOL, HDL, LDLCALC, TRIG, CHOLHDL, LDLDIRECT in the last 72 hours. Thyroid Function Tests: No results for input(s): TSH, T4TOTAL, FREET4, T3FREE, THYROIDAB in the last 72 hours. Anemia Panel: No results for input(s): VITAMINB12, FOLATE, FERRITIN, TIBC, IRON, RETICCTPCT in the last 72 hours. Urine analysis:    Component Value Date/Time   COLORURINE YELLOW 04/24/2017 1943   APPEARANCEUR CLEAR 04/24/2017 1943   LABSPEC 1.016 04/24/2017 1943   PHURINE 6.0 04/24/2017 1943   GLUCOSEU NEGATIVE 04/24/2017 1943   HGBUR SMALL (A) 04/24/2017 [place] 04/24/2017 [place] 04/24/2017 1943   PROTEINUR NEGATIVE 04/24/2017 1943   UROBILINOGEN 0.2 01/24/2012 1315   NITRITE NEGATIVE 04/24/2017 1943   LEUKOCYTESUR NEGATIVE 04/24/2017 1943    Radiological Exams on Admission - Personally Reviewed: DG Chest Port 1 View  Result Date: 07/07/2021 CLINICAL DATA:  Weakness and shortness of breath. EXAM: PORTABLE CHEST 1 VIEW COMPARISON:  October 22, 2017 FINDINGS: The lungs are hyperinflated. Diffuse, chronic appearing increased interstitial lung markings are seen. Mild areas of atelectasis and/or infiltrate are seen involving the mid  left lung, bilateral lung bases and right upper lobe. There is no evidence of a pleural effusion or pneumothorax. Evaluation of the superior mediastinum is limited secondary to patient positioning. The cardiac silhouette is moderately enlarged. Radiopaque surgical clips are seen within the left upper quadrant. Degenerative changes seen throughout the thoracic spine. IMPRESSION: Chronic appearing increased interstitial  lung markings with mild areas of bilateral atelectasis and/or infiltrate. Electronically Signed   By: Virgina Norfolk M.D.   On: 07/07/2021 00:11    EKG: Personally reviewed.  Rhythm is normal sinus rhythm with heart rate of 72 bpm.  Intraventricular conduction delay noted with diffuse T wave inversions.    Assessment/Plan  * Acute on chronic systolic CHF (congestive heart failure) (HCC) Patient presenting with 1 week history of increasing shortness of breath, dyspnea on exertion and peripheral edema with new oxygen requirement at his skilled nursing facility On evaluation here, patient noted to have a markedly elevated BNP with evidence of likely acute cardiogenic pulmonary edema on chest imaging. Patient has been placed on intravenous diuretics with Lasix 40 mg IV twice daily Strict input and output monitoring Supplemental oxygen for bouts of hypoxia Monitoring renal function and electrolytes with serial chemistires Daily weights Echocardiogram ordered for the morning.   Acute respiratory failure with hypoxia ([place]) Patient has exhibited hypoxia with new oxygen requirement resulting in patient being placed on submental oxygen in the outpatient setting Based on patient's presentation and physical exam I feel this is most likely secondary to acute on chronic congestive heart failure However, due to the appearance of the patient's bilateral infiltrates on chest x-ray, will additionally obtain procalcitonin and CRP. Managing congestive heart failure as noted above  Essential  hypertension Resume patients home regimen of oral antihypertensives Titrate antihypertensive regimen as necessary to achieve adequate BP control PRN intravenous antihypertensives for excessively elevated blood pressure    CKD (chronic kidney disease) stage 4, GFR 15-29 ml/min (HCC) Strict intake and output monitoring Creatinine near baseline Minimizing nephrotoxic agents as much as possible Serial chemistries to monitor renal function and electrolytes   Paroxysmal atrial flutter ([place]) Patient currently rate controlled and in normal sinus rhythm Continue home regimen of amiodarone Continue home regimen of Eliquis per home regimen. Monitoring patient on telemetry  COPD (chronic obstructive pulmonary disease) (HCC) No clinical evidence of COPD exacerbation this time As needed bronchodilator therapy for shortness of breath and wheezing.   Acquired hypothyroidism Resume home regimen of Synthroid        Code Status:  DNR  code status decision has been confirmed with: Patient Family Communication: deferred   Status is: Observation  The patient remains OBS appropriate and will d/c before 2 midnights.       Vernelle Emerald MD [facility] Pager [phone]  If 7PM-7AM, please contact night-coverage [email or web address] Use universal [place] password for that web site. If you do not have the password, please call the hospital operator.  07/07/2021, 4:02 AM

## 2021-07-07 NOTE — Plan of Care (Signed)

## 2021-07-07 NOTE — Assessment & Plan Note (Signed)
.   Patient presenting with 1 week history of increasing shortness of breath, dyspnea on exertion and peripheral edema with new oxygen requirement at his skilled nursing facility . On evaluation here, patient noted to have a markedly elevated BNP with evidence of likely acute cardiogenic pulmonary edema on chest imaging. . Patient has been placed on intravenous diuretics with Lasix 40 mg IV twice daily . Strict input and output monitoring . Supplemental oxygen for bouts of hypoxia . Monitoring renal function and electrolytes with serial chemistires . Daily weights . Echocardiogram ordered for the morning.

## 2021-07-07 NOTE — Progress Notes (Signed)
2D echocardiogram completed.  07/07/2021 11:20 AM Kelby Aline., MHA, RVT, RDCS, RDMS

## 2021-07-07 NOTE — Assessment & Plan Note (Addendum)
   Patient currently rate controlled and in normal sinus rhythm  Continue home regimen of amiodarone  Continue home regimen of Eliquis per home regimen.  Monitoring patient on telemetry

## 2021-07-07 NOTE — Assessment & Plan Note (Signed)
.   Resume patients home regimen of oral antihypertensives . Titrate antihypertensive regimen as necessary to achieve adequate BP control . PRN intravenous antihypertensives for excessively elevated blood pressure   

## 2021-07-07 NOTE — Assessment & Plan Note (Signed)
Strict intake and output monitoring Creatinine near baseline Minimizing nephrotoxic agents as much as possible Serial chemistries to monitor renal function and electrolytes  

## 2021-07-07 NOTE — Assessment & Plan Note (Signed)
.   Resume home regimen of Synthroid 

## 2021-07-07 NOTE — Progress Notes (Signed)
RT note: VBG results: Ph7.49 PCO2 28.6 PAO2 33 HCO3 22.2  Istat results not crossing over.  Values RBV with DR. Pollina

## 2021-07-08 DIAGNOSIS — I5043 Acute on chronic combined systolic (congestive) and diastolic (congestive) heart failure: Secondary | ICD-10-CM | POA: Diagnosis not present

## 2021-07-08 DIAGNOSIS — I480 Paroxysmal atrial fibrillation: Secondary | ICD-10-CM | POA: Diagnosis not present

## 2021-07-08 DIAGNOSIS — I5023 Acute on chronic systolic (congestive) heart failure: Secondary | ICD-10-CM | POA: Diagnosis not present

## 2021-07-08 DIAGNOSIS — Z66 Do not resuscitate: Secondary | ICD-10-CM

## 2021-07-08 DIAGNOSIS — N184 Chronic kidney disease, stage 4 (severe): Secondary | ICD-10-CM | POA: Diagnosis not present

## 2021-07-08 DIAGNOSIS — N179 Acute kidney failure, unspecified: Secondary | ICD-10-CM

## 2021-07-08 DIAGNOSIS — E039 Hypothyroidism, unspecified: Secondary | ICD-10-CM | POA: Diagnosis not present

## 2021-07-08 LAB — BASIC METABOLIC PANEL
Anion gap: 8 (ref 5–15)
BUN: 40 mg/dL — ABNORMAL HIGH (ref 8–23)
CO2: 26 mmol/L (ref 22–32)
Calcium: 7.9 mg/dL — ABNORMAL LOW (ref 8.9–10.3)
Chloride: 100 mmol/L (ref 98–111)
Creatinine, Ser: 2.71 mg/dL — ABNORMAL HIGH (ref 0.61–1.24)
GFR, Estimated: 23 mL/min — ABNORMAL LOW (ref >60)
Glucose, Bld: 100 mg/dL — ABNORMAL HIGH (ref 70–99)
Potassium: 4.5 mmol/L (ref 3.5–5.1)
Sodium: 134 mmol/L — ABNORMAL LOW (ref 135–145)

## 2021-07-08 LAB — CBC
HCT: 32.1 % — ABNORMAL LOW (ref 39.0–52.0)
Hemoglobin: 10.3 g/dL — ABNORMAL LOW (ref 13.0–17.0)
MCH: 31.4 pg (ref 26.0–34.0)
MCHC: 32.1 g/dL (ref 30.0–36.0)
MCV: 97.9 fL (ref 80.0–100.0)
Platelets: 336 10*3/uL (ref 150–400)
RBC: 3.28 MIL/uL — ABNORMAL LOW (ref 4.22–5.81)
RDW: 12.5 % (ref 11.5–15.5)
WBC: 10.3 10*3/uL (ref 4.0–10.5)
nRBC: 0 % (ref 0.0–0.2)

## 2021-07-08 LAB — RESP PANEL BY RT-PCR (FLU A&B, COVID) ARPGX2
Influenza A by PCR: NEGATIVE
Influenza B by PCR: NEGATIVE
SARS Coronavirus 2 by RT PCR: NEGATIVE

## 2021-07-08 NOTE — Progress Notes (Signed)
TOC CSW obtained pts PASSAR # as follows:  [id number] A  Texanna Hilburn Tarpley-Carter, MSW, LCSW-A Pronouns:  She/Her/Hers [facility] HealthTransitions of Care Clinical Social Worker Direct Number:  [phone] [email or web address]

## 2021-07-08 NOTE — Consult Note (Addendum)
Advanced Heart Failure Team Consult Note   Primary Physician: [name], MD PCP-Cardiologist:  None  Reason for Consultation: A/C Combined Systolic/Diastolic Heart Failure   HPI:    [name] is seen today for evaluation of heart failure at the request of Dr Harrell Gave.   [name] is a 85 year old with a history of  chronic diastolic/systolic heart failure, cognitive impairment, renal insufficiency (baseline about 1.5-1.6), severe CHF due to NICM (EF 15-20%), atrial flutter (s/p DC-CV in April 2012) and LBBB. He and his family are not interested in ICD.    12/2010: Coronary angiograms showed insignifcant CAD.   12/2010: TEE-guided cardioversion EF 15-20%.  He was successfully cardioverted with 1 shock.  He was started on amiodarone to help maintain sinus rhythm.Given his LBBB we discussed the possibility of BiV-ICD but decided to defer as he was doing so well.  ECHO 07/2021 EF 40-45%.   Followed closely in the [facility] and was last seen by Dr Haroldine Laws in June. GDMT limited by bradycardiac and CKD.   Presented with increased shortness of breath. CXR with mild bilateral atelectasis and or infiltrates. Echo ordered. Pertinent admission labs: HS Trop 42>47, BNP 901, lactic acid 1.5, WBC 10.3, Hgb 11, creatinine 2.2. Started on IV lasix 40 mg twice a day. Losartan stopped. Creatinine trending up tofrom 2/2--> 2.7. Negative > 2.6 liters.   Complaining of productive cough.    Cardiac Testing  02/2011: ECHO with EF 17-61%, Grade 1 diastolic dysfunction, ventricular septum dyssynergy.  Mildly reduced RV systolic function.   2013 ECHO EF 25-30% 2017: Echo EF 35% 2020: EF 40-45%% Grade II DD    Review of Systems: [y] = yes, [ ]  = no   General: Weight gain [ ] ; Weight loss [ ] ; Anorexia [ ] ; Fatigue [ Y]; Fever [ ] ; Chills [ ] ; Weakness [ Y]  Cardiac: Chest pain/pressure [ ] ; Resting SOB [ ] ; Exertional SOB [ Y]; Orthopnea [ ] ; Pedal Edema [ ] ; Palpitations [ ] ; Syncope  [ ] ; Presyncope [ ] ; Paroxysmal nocturnal dyspnea[ ]   Pulmonary: Cough [ ] ; Wheezing[ ] ; Hemoptysis[ ] ; Sputum [ ] ; Snoring [ ]   GI: Vomiting[ ] ; Dysphagia[ ] ; Melena[ ] ; Hematochezia [ ] ; Heartburn[ ] ; Abdominal pain [ ] ; Constipation [ ] ; Diarrhea [ ] ; BRBPR [ ]   GU: Hematuria[ ] ; Dysuria [ ] ; Nocturia[ ]   Vascular: Pain in legs with walking [ ] ; Pain in feet with lying flat [ ] ; Non-healing sores [ ] ; Stroke [ ] ; TIA [ ] ; Slurred speech [ ] ;  Neuro: Headaches[ ] ; Vertigo[ ] ; Seizures[ ] ; Paresthesias[ ] ;Blurred vision [ ] ; Diplopia [ ] ; Vision changes [ ]   Ortho/Skin: Arthritis [ ] ; Joint pain [ Y]; Muscle pain [ ] ; Joint swelling [ ] ; Back Pain [ ] ; Rash [ ]   Psych: Depression[ ] ; Anxiety[ ]   Heme: Bleeding problems [ ] ; Clotting disorders [ ] ; Anemia [ Y]  Endocrine: Diabetes [ ] ; Thyroid dysfunction[ ]   Home Medications Prior to Admission medications   Medication Sig Start Date End Date Taking? Authorizing Provider  amiodarone (PACERONE) 200 MG tablet TAKE 1 TABLET BY MOUTH ONCE DAILY Patient taking differently: Take 200 mg by mouth daily. 01/05/18  Yes Bensimhon, Shaune Pascal, MD  ammonium lactate (AMLACTIN) 12 % cream Apply 1 application topically as needed for dry skin.   Yes [provider]  apixaban (ELIQUIS) 2.5 MG TABS tablet Take 1 tablet (2.5 mg total) by mouth 2 (two) times daily. Please cancel  all previous orders for current medication. Change in dosage or pill size. 11/09/18  Yes Bensimhon, Shaune Pascal, MD  aspirin EC 81 MG tablet Take 81 mg by mouth daily.   Yes [provider]  furosemide (LASIX) 40 MG tablet Take 40 mg by mouth daily.   Yes [provider]  hydrALAZINE (APRESOLINE) 25 MG tablet TAKE 1 TABLET BY MOUTH THREE TIMES DAILY Patient taking differently: Take 25 mg by mouth 3 (three) times daily. 12/23/17  Yes Bensimhon, Shaune Pascal, MD  hydrocortisone ointment 0.5 % Apply 1 application topically 2 (two) times daily as needed for itching. Right thumb    Yes [provider]  hydrocortisone valerate cream (WESTCORT) 0.2 % Apply 1 application topically 2 (two) times daily. To affected areas of ear, scalp, hair, and nostrils/nose   Yes [provider]  isosorbide mononitrate (IMDUR) 30 MG 24 hr tablet TAKE 1 TABLET BY MOUTH ONCE DAILY IN THE MORNING Patient taking differently: Take 30 mg by mouth daily. 09/10/17  Yes Bensimhon, Shaune Pascal, MD  ketoconazole (NIZORAL) 2 % cream Apply 1 application topically 2 (two) times daily. To affected areas of ear, scalp, hair, and nostrils/nose   Yes [provider]  ketoconazole (NIZORAL) 2 % shampoo Apply 1 application topically 2 (two) times a week. With hair washing on Wednesday's and Saturday's   Yes [provider]  levothyroxine (SYNTHROID) 150 MCG tablet Take 150 mcg by mouth daily before breakfast.   Yes [provider]  loratadine (CLARITIN) 10 MG tablet Take 10 mg by mouth daily.   Yes [provider]  losartan (COZAAR) 25 MG tablet TAKE 1 TABLET BY MOUTH ONCE DAILY Patient taking differently: Take 25 mg by mouth daily. 12/29/18  Yes Reed, Tiffany L, DO  polyethylene glycol (MIRALAX / GLYCOLAX) 17 g packet Take 17 g by mouth daily as needed for mild constipation.   Yes [provider]  spironolactone (ALDACTONE) 25 MG tablet TAKE ONE TABLET BY MOUTH ONCE DAILY Patient taking differently: Take 25 mg by mouth once. 02/03/17  Yes Bensimhon, Shaune Pascal, MD    Past Medical History: Past Medical History:  Diagnosis Date   Arthritis    Atrial fibrillation ([place])    Atrial flutter ([place])    Cardiomyopathy    Nonischemic, LVEF 20%   CHF (congestive heart failure) (H[facility])    Chronic renal insufficiency    COPD (chronic obstructive pulmonary disease) ([place])    Fracture of unspecified part of left clavicle, initial encounter for closed fracture    Hypothyroidism    Left ankle sprain    Left bundle branch block    Mixed hyperlipidemia    Renal failure     Scoliosis     Past Surgical History: Past Surgical History:  Procedure Laterality Date   EYE SURGERY     LAPAROSCOPIC CHOLECYSTECTOMY  2008   left foot surgery  1998   MASS EXCISION Left 06/26/2016   Procedure: EXCISION Mucoid tumor;  Surgeon: Daryll Brod, MD;  Location: [facility];  Service: Orthopedics;  Laterality: Left;  FAB   Right inguinal herniorrhaphy     ROTATION FLAP Left 06/26/2016   Procedure: debridement of distal interphalangeal possible, ROTATION FLAP left index finger;  Surgeon: Daryll Brod, MD;  Location: [facility];  Service: Orthopedics;  Laterality: Left;   SKIN CANCER REMOVED  LEFT EAR     TONSILLECTOMY      Family History: Family History  Problem Relation Age of Onset   Heart disease Father  Died age 38   Stroke Mother        Died age 70   Diabetes Mother    Pancreatic cancer Brother    Arthritis Brother    Throat cancer Brother    Arthritis Brother    Diabetes Sister    Seizures Sister     Social History: Social History   Socioeconomic History   Marital status: Married    Spouse name: Not on file   Number of children: Not on file   Years of education: Not on file   Highest education level: Not on file  Occupational History   Occupation: Retired    Comment: Previously worked at [employer] Use   Smoking status: Never   Smokeless tobacco: Never  Scientific laboratory technician Use: Never used  Substance and Sexual Activity   Alcohol use: Not Currently   Drug use: No   Sexual activity: Not on file  Other Topics Concern   Not on file  Social History Narrative   Tobacco use, amount per day now: No      Past tobacco use, amount per day: No      How many years did you use tobacco: No      Alcohol use (drinks per week): No      Diet: Low Salt       Do you drink/eat things with caffeine? Tea      Marital status: Married             What year were you married? 1975      Do you live in a house, apartment, assisted living, condo, trailer?       Is it one or more stories?      How many persons live in your home?      Do you have any pets in your home?      Current or past profession?      Do you exercise?             How often?      Do you have a living will? Yes      Do you have a DNR form? Yes           If not, do you want to discuss one?      Do you have signed POA/HPOA forms? Yes              Social Determinants of Health   Financial Resource Strain: Not on file  Food Insecurity: Not on file  Transportation Needs: Not on file  Physical Activity: Not on file  Stress: Not on file  Social Connections: Not on file    Allergies:  No Known Allergies  Objective:    Vital Signs:   Temp:  [98.3 F (36.8 C)-98.9 F (37.2 C)] 98.7 F (37.1 C) (10/24 0730) Pulse Rate:  [52-76] 62 (10/24 0730) Resp:  [17-26] 17 (10/24 0730) BP: (95-129)/(38-52) 104/41 (10/24 0730) SpO2:  [94 %-100 %] 97 % (10/24 0730) Weight:  [59.6 kg-60.4 kg] 59.6 kg (10/24 0412) Last BM Date: 07/06/21  Weight change: Filed Weights   07/06/21 2343 07/07/21 1545 07/08/21 0412  Weight: 65.8 kg 60.4 kg 59.6 kg    Intake/Output:   Intake/Output Summary (Last 24 hours) at 07/08/2021 0910 Last data filed at 07/08/2021 0837 Gross per 24 hour  Intake 720 ml  Output 2400 ml  Net -1680 ml      Physical Exam    General:  No resp difficulty HEENT: normal Neck: supple. JVP  6-7 . Carotids 2+ bilat; no bruits. No lymphadenopathy or thyromegaly appreciated. Cor: PMI nondisplaced. Regular rate & rhythm. No rubs, gallops or murmurs. Lungs: Decreased in the bases on 2 liters [place].  Abdomen: soft, nontender, nondistended. No hepatosplenomegaly. No bruits or masses. Good bowel sounds. Extremities: no cyanosis, clubbing, rash, edema Neuro: alert & orientedx3, cranial nerves grossly intact. moves all 4 extremities w/o difficulty. Affect pleasant   Telemetry  SR 70-80s   EKG    SR with LBBB  Labs   Basic Metabolic Panel: Recent Labs   Lab 07/06/21 2344 07/07/21 0050 07/07/21 0425 07/08/21 0357  NA 133* 131* 133* 134*  K 4.8 4.4 4.8 4.5  CL 103  --  103 100  CO2 22  --  22 26  GLUCOSE 151*  --  118* 100*  BUN 35*  --  34* 40*  CREATININE 2.17*  --  2.05* 2.71*  CALCIUM 7.8*  --  7.8* 7.9*  MG  --   --  1.9  --     Liver Function Tests: Recent Labs  Lab 07/06/21 2344 07/07/21 0425  AST 39 36  ALT 28 30  ALKPHOS 89 85  BILITOT 0.4 0.6  PROT 6.5 6.4*  ALBUMIN 2.3* 2.2*   No results for input(s): LIPASE, AMYLASE in the last 168 hours. No results for input(s): AMMONIA in the last 168 hours.  CBC: Recent Labs  Lab 07/06/21 2344 07/07/21 0050 07/07/21 0425 07/08/21 0357  WBC 10.5  --  12.2* 10.3  NEUTROABS 8.3*  --  9.7*  --   HGB 10.3* 9.2* 11.0* 10.3*  HCT 32.6* 27.0* 34.1* 32.1*  MCV 100.6*  --  100.0 97.9  PLT 334  --  341 336    Cardiac Enzymes: No results for input(s): CKTOTAL, CKMB, CKMBINDEX, TROPONINI in the last 168 hours.  BNP: BNP (last 3 results) Recent Labs    07/06/21 2344  BNP 901.6*    ProBNP (last 3 results) No results for input(s): PROBNP in the last 8760 hours.   CBG: No results for input(s): GLUCAP in the last 168 hours.  Coagulation Studies: No results for input(s): LABPROT, INR in the last 72 hours.   Imaging   ECHOCARDIOGRAM COMPLETE  Result Date: 07/07/2021    ECHOCARDIOGRAM REPORT   Patient Name:   [name] Date of Exam: 07/07/2021 Medical Rec #:  [id number]        Height:       64.0 in Accession #:    [id number]       Weight:       145.0 lb Date of Birth:  July 24, 1936       BSA:          1.706 m Patient Age:    10 years         BP:           107/55 mmHg Patient Gender: M                HR:           72 bpm. Exam Location:  Inpatient Procedure: 2D Echo, Cardiac Doppler and Color Doppler Indications:    CHF  History:        Patient has prior history of Echocardiogram examinations, most                 recent 07/28/2019. Arrythmias:LBBB; Risk  Factors:Dyslipidemia.  Sonographer:    Maudry Mayhew  MHA, Alda, RVT, RDCS Referring Phys: [id number] [facility]  Sonographer Comments: No subcostal window. IMPRESSIONS  1. Left ventricular ejection fraction, by estimation, is 30 to 35%. The left ventricle has moderately decreased function. The left ventricle demonstrates regional wall motion abnormalities (see scoring diagram/findings for description). The left ventricular internal cavity size was mildly dilated. Left ventricular diastolic parameters are consistent with Grade II diastolic dysfunction (pseudonormalization). Elevated left ventricular end-diastolic pressure.  2. Right ventricular systolic function is mildly reduced. The right ventricular size is normal.  3. Left atrial size was moderately dilated.  4. Right atrial size was mild to moderately dilated.  5. The mitral valve is normal in structure. Mild mitral valve regurgitation. No evidence of mitral stenosis.  6. The aortic valve was not well visualized. Aortic valve regurgitation is mild to moderate. Comparison(s): Changes from prior study are noted. Conclusion(s)/Recommendation(s): EF reduced compared to prior. Global hypokinesis with akinesis/dyskinesis of the entire septum, out of proportion to dyssychrony from LBBB. FINDINGS  Left Ventricle: Left ventricular ejection fraction, by estimation, is 30 to 35%. The left ventricle has moderately decreased function. The left ventricle demonstrates regional wall motion abnormalities. The left ventricular internal cavity size was mildly dilated. There is no left ventricular hypertrophy. Abnormal (paradoxical) septal motion, consistent with left bundle branch block. Left ventricular diastolic parameters are consistent with Grade II diastolic dysfunction (pseudonormalization). Elevated left ventricular end-diastolic pressure.  LV Wall Scoring: The anterior septum is dyskinetic. The inferior septum is akinetic. The entire anterior wall, entire lateral  wall, entire inferior wall, and apex are hypokinetic. Right Ventricle: The right ventricular size is normal. Right vetricular wall thickness was not well visualized. Right ventricular systolic function is mildly reduced. Left Atrium: Left atrial size was moderately dilated. Right Atrium: Right atrial size was mild to moderately dilated. Pericardium: There is no evidence of pericardial effusion. Mitral Valve: The mitral valve is normal in structure. Mild mitral valve regurgitation. No evidence of mitral valve stenosis. Tricuspid Valve: The tricuspid valve is normal in structure. Tricuspid valve regurgitation is trivial. No evidence of tricuspid stenosis. Aortic Valve: The aortic valve was not well visualized. Aortic valve regurgitation is mild to moderate. Aortic regurgitation PHT measures 422 msec. Aortic valve mean gradient measures 4.0 mmHg. Aortic valve peak gradient measures 7.0 mmHg. Aortic valve area, by VTI measures 1.56 cm. Pulmonic Valve: The pulmonic valve was not well visualized. Pulmonic valve regurgitation is not visualized. Aorta: The aortic root is normal in size and structure, the aortic arch was not well visualized and the ascending aorta was not well visualized. IAS/Shunts: The atrial septum is grossly normal.  LEFT VENTRICLE PLAX 2D LVIDd:         5.75 cm   Diastology LVIDs:         4.90 cm   LV e' medial:    4.12 cm/s LV PW:         0.70 cm   LV E/e' medial:  30.3 LV IVS:        0.60 cm   LV e' lateral:   8.93 cm/s LVOT diam:     1.70 cm   LV E/e' lateral: 14.0 LV SV:         44 LV SV Index:   26 LVOT Area:     2.27 cm  RIGHT VENTRICLE TAPSE (M-mode): 1.5 cm LEFT ATRIUM             Index        RIGHT ATRIUM  Index LA diam:        3.50 cm 2.05 cm/m   RA Area:     17.20 cm LA Vol (A2C):   48.9 ml 28.66 ml/m  RA Volume:   48.10 ml  28.19 ml/m LA Vol (A4C):   73.1 ml 42.84 ml/m LA Biplane Vol: 59.9 ml 35.10 ml/m  AORTIC VALVE AV Area (Vmax):    1.61 cm AV Area (Vmean):   1.57 cm AV  Area (VTI):     1.56 cm AV Vmax:           132.00 cm/s AV Vmean:          85.500 cm/s AV VTI:            0.280 m AV Peak Grad:      7.0 mmHg AV Mean Grad:      4.0 mmHg LVOT Vmax:         93.80 cm/s LVOT Vmean:        59.100 cm/s LVOT VTI:          0.192 m LVOT/AV VTI ratio: 0.69 AI PHT:            422 msec  AORTA Ao Root diam: 3.10 cm MITRAL VALVE                TRICUSPID VALVE MV Area (PHT): 2.07 cm     TR Peak grad:   12.4 mmHg MV Decel Time: 366 msec     TR Vmax:        176.00 cm/s MV E velocity: 124.67 cm/s MV A velocity: 93.60 cm/s   SHUNTS MV E/A ratio:  1.33         Systemic VTI:  0.19 m                             Systemic Diam: 1.70 cm Buford Dresser MD Electronically signed by Buford Dresser MD Signature Date/Time: 07/07/2021/2:22:20 PM    Final      Medications:     Current Medications:  amiodarone  200 mg Oral Daily   apixaban  2.5 mg Oral BID   aspirin EC  81 mg Oral Daily   furosemide  40 mg Intravenous BID   hydrALAZINE  25 mg Oral TID   isosorbide mononitrate  30 mg Oral q morning   levothyroxine  150 mcg Oral Q0600    Infusions:      Assessment/Plan   A/C Combined Systolic Heart Failure , NICM  - Cath 2013 no CAD -Echo 2020 EF 40-45%--> this admit EF 30-35% with LBBB. He/Family refused ICD/CRT-D dating back to 20212. HS Trop no trend.  ? EF worse. Would not pursue cath with elevated creatinine and no chest pain.  - He has been diuresing with IV lasix and now with creatinine bump. Suspect he is dry. Stop IV lasix.  -Reds Clip  19 confirms he is dry.  - No SGLT2i with volume depletion.  - Losartan stopped earlier today. Hold off on spiro for now.  - No bb with know bradycardia.  - Continue hydralazine 25 mg tid + imdur  - Follow BMET daily   2. AKI on CKD Stage IV Creatinine baseline 2-2.5 Creatinine on admit 2.2-->2.7 today   3. PAF -In SR on admit.  Continue amio 200 mg daily  - Continue eliquis 2.5 mg twice a day. Reduced dose with age and  elevated creatinine   4. Hypothyroidism  On  Levothyroxine.   5. Deconditioning  Consult PT  6. DNR/DNI   Reds Clip 19%   Consult HFSW - from Assisted [facility] at [facility]?  Consult PT.    Length of Stay: 0  Darrick Grinder, NP  07/08/2021, 9:10 AM  Advanced Heart Failure Team Pager [phone] (M-F; 7a - 5p)  Please contact [facility] for night-coverage after hours (4p -7a ) and weekends on [email or web address]  Patient seen with NP, agree with the above note.   Patient with known nonischemic cardiomyopathy was admitted with worsening dyspnea and cough.  He was diuresed with IV Lasix with good UOP.   Echo with EF 30-35%, septal akinesis (EF lower than prior).   Today, creatinine up 2.05 => 2.7.  Hs-TnI mildly elevated with no trend.  No chest pain.  REDS clip 19%.   General: NAD, kyphotic Neck: No JVD, no thyromegaly or thyroid nodule.  Lungs: Clear to auscultation bilaterally with normal respiratory effort. CV: Nondisplaced PMI.  Heart regular S1/S2, no S3/S4, no murmur.  No peripheral edema.  No carotid bruit.  Normal pedal pulses.  Abdomen: Soft, nontender, no hepatosplenomegaly, no distention.  Skin: Intact without lesions or rashes.  Neurologic: Alert and oriented x 3.  Psych: Normal affect. Extremities: No clubbing or cyanosis.  HEENT: Normal.   Patient was admitted with presumed acute on chronic systolic CHF and diuresed.  Today, he appears euvolemic to dry by exam and REDS clip.  Feels like breathing is back to baseline.  - No diuretics today.  - Hold losartan and spironolactone until creatinine trends down.  - Can continue hydralazine/Imdur.   Echo showed lower EF than prior, 30-35%. No chest pain, mildly elevated troponin with no trend.  Suspect demand ischemia.  Doubt ACS, would avoid cath with AKI.   Hopefully back to SNF tomorrow if creatinine is coming down.   Loralie Champagne 07/08/2021 07/08/2021

## 2021-07-08 NOTE — Progress Notes (Signed)
PROGRESS NOTE    [name]  ZOX:[id number] DOB: 01-Jul-1936 DOA: 07/06/2021 PCP: [name], MD     Brief Narrative:  [name] is an 85 year old male with past medical history of chronic kidney disease stage IV, systolic congestive heart failure (Echo 07/2019 EF as low as 15-20% but latest 40-45%), atrial flutter (S/P DCCV 12/2010), hypertension, hypothyroidism, left bundle branch block and chronic kidney disease stage IV presents to [facility] emergency department via EMS with shortness of breath and weakness.   Patient explains that for at least the past week he has been developing increasing shortness of breath.  Shortness of breath progressively became more more severe as the days progressed.  Shortness breath is worse with exertion and improved with rest.  Symptoms have additionally been associated with ongoing nonproductive cough and generalized weakness.    In the emergency department patient was found to have a markedly elevated BNP of 901 with evidence of bilateral patchy infiltrates on chest x-ray concerning for pulmonary edema.  Patient was administered 40 mg of intravenous Lasix for suspected acute congestive heart failure.  Cardiology was consulted.   New events last 24 hours / Subjective: Patient feeling about the same today.  No new complaints.  Remains some shortness of breath and cough.  Urine output recorded 3200 mL last 24 hours.  Assessment & Plan:   Principal Problem:   Acute on chronic systolic CHF (congestive heart failure) (HCC) Active Problems:   Paroxysmal atrial flutter (HCC)   COPD (chronic obstructive pulmonary disease) (HCC)   CKD (chronic kidney disease) stage 4, GFR 15-29 ml/min (HCC)   Acquired hypothyroidism   Acute respiratory failure with hypoxia (HCC)   Essential hypertension   Acute on chronic combined systolic/diastolic heart failure  -Symptoms of increasing shortness of breath, dyspnea on exertion, peripheral edema  worsening in setting of diet/fluid noncompliance -Echo on 10/23 showed EF 81-19%, grade 2 diastolic dysfunction -Lasix on hold with bump in creatinine today -Strict I/O's, daily weights, fluid restriction diet -Appreciate cardiology/heart failure team  Acute hypoxemic respiratory failure -Patient with hypoxia with new oxygen requirement -Remains on 2 L oxygen this morning  Hypertension -ARB on hold due to elevated creatinine -Continue hydralazine, Imdur  AKI on CKD stage IV -Baseline creatinine 2-2.4  -Hold Lasix and ARB today and monitor BMP  Paroxysmal atrial flutter -Continue Eliquis, amiodarone  Hypothyroidism -Continue Synthroid   DVT prophylaxis:  apixaban (ELIQUIS) tablet 2.5 mg Start: 07/07/21 1000 apixaban (ELIQUIS) tablet 2.5 mg  Code Status: DNR  Family Communication: Called brother but no answer. Left voicemail.  Disposition Plan:  Status is: Observation  The patient will require care spanning > 2 midnights and should be moved to inpatient because: heart failure exacerbation and worsening Cr   Consultants:  Cardiology  Procedures:  None   Antimicrobials:  Anti-infectives (From admission, onward)    None        Objective: Vitals:   07/08/21 0011 07/08/21 0412 07/08/21 0730 07/08/21 0900  BP: (!) 103/46 (!) 102/43 (!) 104/41 (!) 119/44  Pulse: 66 62 62   Resp: 20 19 17    Temp: 98.7 F (37.1 C) 98.9 F (37.2 C) 98.7 F (37.1 C)   TempSrc: Oral Oral Oral   SpO2: 96% 95% 97%   Weight:  59.6 kg    Height:        Intake/Output Summary (Last 24 hours) at 07/08/2021 1043 Last data filed at 07/08/2021 0837 Gross per 24 hour  Intake 720 ml  Output 2400 ml  Net -1680 ml   Filed Weights   07/06/21 2343 07/07/21 1545 07/08/21 0412  Weight: 65.8 kg 60.4 kg 59.6 kg    Examination:  General exam: Appears calm and comfortable, frail appearing Respiratory system: Clear to auscultation. Respiratory effort normal. No respiratory distress.   Diminished breath sounds without rhonchi or wheeze Cardiovascular system: S1 & S2 heard, RRR. No murmurs. No pedal edema. Gastrointestinal system: Abdomen is nondistended, soft and nontender. Normal bowel sounds heard. Central nervous system: Alert and oriented. No focal neurological deficits. Speech clear.  Extremities: Symmetric in appearance  Skin: No rashes, lesions or ulcers on exposed skin  Psychiatry: Judgement and insight appear normal. Mood & affect appropriate.   Data Reviewed: I have personally reviewed following labs and imaging studies  CBC: Recent Labs  Lab 07/06/21 2344 07/07/21 0050 07/07/21 0425 07/08/21 0357  WBC 10.5  --  12.2* 10.3  NEUTROABS 8.3*  --  9.7*  --   HGB 10.3* 9.2* 11.0* 10.3*  HCT 32.6* 27.0* 34.1* 32.1*  MCV 100.6*  --  100.0 97.9  PLT 334  --  341 831   Basic Metabolic Panel: Recent Labs  Lab 07/06/21 2344 07/07/21 0050 07/07/21 0425 07/08/21 0357  NA 133* 131* 133* 134*  K 4.8 4.4 4.8 4.5  CL 103  --  103 100  CO2 22  --  22 26  GLUCOSE 151*  --  118* 100*  BUN 35*  --  34* 40*  CREATININE 2.17*  --  2.05* 2.71*  CALCIUM 7.8*  --  7.8* 7.9*  MG  --   --  1.9  --    GFR: Estimated Creatinine Clearance: 17.6 mL/min (A) (by C-G formula based on SCr of 2.71 mg/dL (H)). Liver Function Tests: Recent Labs  Lab 07/06/21 2344 07/07/21 0425  AST 39 36  ALT 28 30  ALKPHOS 89 85  BILITOT 0.4 0.6  PROT 6.5 6.4*  ALBUMIN 2.3* 2.2*   No results for input(s): LIPASE, AMYLASE in the last 168 hours. No results for input(s): AMMONIA in the last 168 hours. Coagulation Profile: No results for input(s): INR, PROTIME in the last 168 hours. Cardiac Enzymes: No results for input(s): CKTOTAL, CKMB, CKMBINDEX, TROPONINI in the last 168 hours. BNP (last 3 results) No results for input(s): PROBNP in the last 8760 hours. HbA1C: No results for input(s): HGBA1C in the last 72 hours. CBG: No results for input(s): GLUCAP in the last 168  hours. Lipid Profile: No results for input(s): CHOL, HDL, LDLCALC, TRIG, CHOLHDL, LDLDIRECT in the last 72 hours. Thyroid Function Tests: No results for input(s): TSH, T4TOTAL, FREET4, T3FREE, THYROIDAB in the last 72 hours. Anemia Panel: No results for input(s): VITAMINB12, FOLATE, FERRITIN, TIBC, IRON, RETICCTPCT in the last 72 hours. Sepsis Labs: Recent Labs  Lab 07/06/21 2344 07/07/21 0212  PROCALCITON  --  <0.10  LATICACIDVEN 1.5  --     Recent Results (from the past 240 hour(s))  Resp Panel by RT-PCR (Flu A&B, Covid) Nasopharyngeal Swab     Status: None   Collection Time: 07/06/21 11:43 PM   Specimen: Nasopharyngeal Swab; Nasopharyngeal(NP) swabs in vial transport medium  Result Value Ref Range Status   SARS Coronavirus 2 by RT PCR NEGATIVE NEGATIVE Final    Comment: (NOTE) SARS-CoV-2 target nucleic acids are NOT DETECTED.  The SARS-CoV-2 RNA is generally detectable in upper respiratory specimens during the acute phase of infection. The lowest concentration of SARS-CoV-2 viral copies this assay can detect  is 138 copies/mL. A negative result does not preclude SARS-Cov-2 infection and should not be used as the sole basis for treatment or other patient management decisions. A negative result may occur with  improper specimen collection/handling, submission of specimen other than nasopharyngeal swab, presence of viral mutation(s) within the areas targeted by this assay, and inadequate number of viral copies(<138 copies/mL). A negative result must be combined with clinical observations, patient history, and epidemiological information. The expected result is Negative.  Fact Sheet for Patients:  EntrepreneurPulse.com.au  Fact Sheet for Healthcare Providers:  IncredibleEmployment.be  This test is no t yet approved or cleared by the [place] FDA and  has been authorized for detection and/or diagnosis of SARS-CoV-2 by FDA under an  Emergency Use Authorization (EUA). This EUA will remain  in effect (meaning this test can be used) for the duration of the COVID-19 declaration under Section 564(b)(1) of the Act, 21 U.S.C.section 360bbb-3(b)(1), unless the authorization is terminated  or revoked sooner.       Influenza A by PCR NEGATIVE NEGATIVE Final   Influenza B by PCR NEGATIVE NEGATIVE Final    Comment: (NOTE) The Xpert Xpress SARS-CoV-2/FLU/RSV plus assay is intended as an aid in the diagnosis of influenza from Nasopharyngeal swab specimens and should not be used as a sole basis for treatment. Nasal washings and aspirates are unacceptable for Xpert Xpress SARS-CoV-2/FLU/RSV testing.  Fact Sheet for Patients: EntrepreneurPulse.com.au  Fact Sheet for Healthcare Providers: IncredibleEmployment.be  This test is not yet approved or cleared by the [place] FDA and has been authorized for detection and/or diagnosis of SARS-CoV-2 by FDA under an Emergency Use Authorization (EUA). This EUA will remain in effect (meaning this test can be used) for the duration of the COVID-19 declaration under Section 564(b)(1) of the Act, 21 U.S.C. section 360bbb-3(b)(1), unless the authorization is terminated or revoked.  Performed at [facility], [place] [address] [place], [place] [address]       Radiology Studies: DG Chest Port 1 View  Result Date: 07/07/2021 CLINICAL DATA:  Weakness and shortness of breath. EXAM: PORTABLE CHEST 1 VIEW COMPARISON:  October 22, 2017 FINDINGS: The lungs are hyperinflated. Diffuse, chronic appearing increased interstitial lung markings are seen. Mild areas of atelectasis and/or infiltrate are seen involving the mid left lung, bilateral lung bases and right upper lobe. There is no evidence of a pleural effusion or pneumothorax. Evaluation of the superior mediastinum is limited secondary to patient positioning. The cardiac silhouette is moderately  enlarged. Radiopaque surgical clips are seen within the left upper quadrant. Degenerative changes seen throughout the thoracic spine. IMPRESSION: Chronic appearing increased interstitial lung markings with mild areas of bilateral atelectasis and/or infiltrate. Electronically Signed   By: Virgina Norfolk M.D.   On: 07/07/2021 00:11   ECHOCARDIOGRAM COMPLETE  Result Date: 07/07/2021    ECHOCARDIOGRAM REPORT   Patient Name:   GEOVANNY SARTIN Date of Exam: 07/07/2021 Medical Rec #:  [id number]        Height:       64.0 in Accession #:    [id number]       Weight:       145.0 lb Date of Birth:  1935-09-16       BSA:          1.706 m Patient Age:    38 years         BP:           107/55 mmHg Patient Gender: M  HR:           72 bpm. Exam Location:  Inpatient Procedure: 2D Echo, Cardiac Doppler and Color Doppler Indications:    CHF  History:        Patient has prior history of Echocardiogram examinations, most                 recent 07/28/2019. Arrythmias:LBBB; Risk Factors:Dyslipidemia.  Sonographer:    Maudry Mayhew [place], RVT, RDCS Referring Phys: [id number] [facility]  Sonographer Comments: No subcostal window. IMPRESSIONS  1. Left ventricular ejection fraction, by estimation, is 30 to 35%. The left ventricle has moderately decreased function. The left ventricle demonstrates regional wall motion abnormalities (see scoring diagram/findings for description). The left ventricular internal cavity size was mildly dilated. Left ventricular diastolic parameters are consistent with Grade II diastolic dysfunction (pseudonormalization). Elevated left ventricular end-diastolic pressure.  2. Right ventricular systolic function is mildly reduced. The right ventricular size is normal.  3. Left atrial size was moderately dilated.  4. Right atrial size was mild to moderately dilated.  5. The mitral valve is normal in structure. Mild mitral valve regurgitation. No evidence of mitral stenosis.  6. The aortic  valve was not well visualized. Aortic valve regurgitation is mild to moderate. Comparison(s): Changes from prior study are noted. Conclusion(s)/Recommendation(s): EF reduced compared to prior. Global hypokinesis with akinesis/dyskinesis of the entire septum, out of proportion to dyssychrony from LBBB. FINDINGS  Left Ventricle: Left ventricular ejection fraction, by estimation, is 30 to 35%. The left ventricle has moderately decreased function. The left ventricle demonstrates regional wall motion abnormalities. The left ventricular internal cavity size was mildly dilated. There is no left ventricular hypertrophy. Abnormal (paradoxical) septal motion, consistent with left bundle branch block. Left ventricular diastolic parameters are consistent with Grade II diastolic dysfunction (pseudonormalization). Elevated left ventricular end-diastolic pressure.  LV Wall Scoring: The anterior septum is dyskinetic. The inferior septum is akinetic. The entire anterior wall, entire lateral wall, entire inferior wall, and apex are hypokinetic. Right Ventricle: The right ventricular size is normal. Right vetricular wall thickness was not well visualized. Right ventricular systolic function is mildly reduced. Left Atrium: Left atrial size was moderately dilated. Right Atrium: Right atrial size was mild to moderately dilated. Pericardium: There is no evidence of pericardial effusion. Mitral Valve: The mitral valve is normal in structure. Mild mitral valve regurgitation. No evidence of mitral valve stenosis. Tricuspid Valve: The tricuspid valve is normal in structure. Tricuspid valve regurgitation is trivial. No evidence of tricuspid stenosis. Aortic Valve: The aortic valve was not well visualized. Aortic valve regurgitation is mild to moderate. Aortic regurgitation PHT measures 422 msec. Aortic valve mean gradient measures 4.0 mmHg. Aortic valve peak gradient measures 7.0 mmHg. Aortic valve area, by VTI measures 1.56 cm. Pulmonic  Valve: The pulmonic valve was not well visualized. Pulmonic valve regurgitation is not visualized. Aorta: The aortic root is normal in size and structure, the aortic arch was not well visualized and the ascending aorta was not well visualized. IAS/Shunts: The atrial septum is grossly normal.  LEFT VENTRICLE PLAX 2D LVIDd:         5.75 cm   Diastology LVIDs:         4.90 cm   LV e' medial:    4.12 cm/s LV PW:         0.70 cm   LV E/e' medial:  30.3 LV IVS:        0.60 cm   LV e' lateral:  8.93 cm/s LVOT diam:     1.70 cm   LV E/e' lateral: 14.0 LV SV:         44 LV SV Index:   26 LVOT Area:     2.27 cm  RIGHT VENTRICLE TAPSE (M-mode): 1.5 cm LEFT ATRIUM             Index        RIGHT ATRIUM           Index LA diam:        3.50 cm 2.05 cm/m   RA Area:     17.20 cm LA Vol (A2C):   48.9 ml 28.66 ml/m  RA Volume:   48.10 ml  28.19 ml/m LA Vol (A4C):   73.1 ml 42.84 ml/m LA Biplane Vol: 59.9 ml 35.10 ml/m  AORTIC VALVE AV Area (Vmax):    1.61 cm AV Area (Vmean):   1.57 cm AV Area (VTI):     1.56 cm AV Vmax:           132.00 cm/s AV Vmean:          85.500 cm/s AV VTI:            0.280 m AV Peak Grad:      7.0 mmHg AV Mean Grad:      4.0 mmHg LVOT Vmax:         93.80 cm/s LVOT Vmean:        59.100 cm/s LVOT VTI:          0.192 m LVOT/AV VTI ratio: 0.69 AI PHT:            422 msec  AORTA Ao Root diam: 3.10 cm MITRAL VALVE                TRICUSPID VALVE MV Area (PHT): 2.07 cm     TR Peak grad:   12.4 mmHg MV Decel Time: 366 msec     TR Vmax:        176.00 cm/s MV E velocity: 124.67 cm/s MV A velocity: 93.60 cm/s   SHUNTS MV E/A ratio:  1.33         Systemic VTI:  0.19 m                             Systemic Diam: 1.70 cm Buford Dresser MD Electronically signed by Buford Dresser MD Signature Date/Time: 07/07/2021/2:22:20 PM    Final       Scheduled Meds:  amiodarone  200 mg Oral Daily   apixaban  2.5 mg Oral BID   hydrALAZINE  25 mg Oral TID   isosorbide mononitrate  30 mg Oral q morning    levothyroxine  150 mcg Oral Q0600   Continuous Infusions:   LOS: 0 days      Time spent: 30 minutes   Dessa Phi, DO [facility] 07/08/2021, 10:43 AM   Available via Epic secure chat 7am-7pm After these hours, please refer to coverage provider listed on [email or web address]

## 2021-07-08 NOTE — Evaluation (Addendum)
Physical Therapy Evaluation Patient Details Name: [name] MRN: [id number] DOB: 1936-07-06 Today's Date: 07/08/2021  History of Present Illness  [name] is an 85 year old male with past medical history of chronic kidney disease stage IV, systolic congestive heart failure (Echo 07/2019 EF as low as 15-20% but latest 40-45%), atrial flutter (S/P DCCV 12/2010), hypertension, hypothyroidism, left bundle branch block and chronic kidney disease stage IV admitted with shortness of breath and weakness due to LL PE & CHF exacerbation.  Clinical Impression  Patient presents with decreased mobility due to recent issues as noted above.  Reports has help for transfers at ALF to wheelchair to get to toilet.  Patient currently mod A for sit to stand due to posterior bias and needing min a with walker to transfer to recliner.  Patient could benefit from skilled PT in the acute setting and follow up STSNF to maximize mobility, safety and independence prior to return to ALF.      Recommendations for follow up therapy are one component of a multi-disciplinary discharge planning process, led by the attending physician.  Recommendations may be updated based on patient status, additional functional criteria and insurance authorization.  Follow Up Recommendations Skilled nursing-short term rehab (<3 hours/day)    Assistance Recommended at Discharge Intermittent Supervision/Assistance  Functional Status Assessment Patient has had a recent decline in their functional status and/or demonstrates limited ability to make significant improvements in function in a reasonable and predictable amount of time  Equipment Recommendations  None recommended by PT    Recommendations for Other Services       Precautions / Restrictions Precautions Precautions: Fall Precaution Comments: new O2      Mobility  Bed Mobility Overal bed mobility: Needs Assistance Bed Mobility: Supine to Sit     Supine to sit:  Min assist;HOB elevated     General bed mobility comments: able to lift trunk and move legs to EOB some difficulty scooting to EOB    Transfers Overall transfer level: Needs assistance Equipment used: Rolling walker (2 wheels) Transfers: Sit to/from [facility] Sit to Stand: Mod assist Stand pivot transfers: Min assist         General transfer comment: up into standing with some lifting help as falls posterior initially without RW obtained RW for improved stability for stand step to recliner    Ambulation/Gait                Stairs            Wheelchair Mobility    Modified Rankin (Stroke Patients Only)       Balance Overall balance assessment: Needs assistance Sitting-balance support: Feet supported Sitting balance-Leahy Scale: Fair Sitting balance - Comments: poor balance due to kyphoscolisis Postural control: Posterior lean Standing balance support: Bilateral upper extremity supported Standing balance-Leahy Scale: Poor Standing balance comment: UE support and assist due to posterior lean in standing and with dynamic activity                             Pertinent Vitals/Pain Pain Assessment: No/denies pain    Home Living Family/patient expects to be discharged to:: Assisted living                 Home Equipment: Wheelchair - manual (transport)      Prior Function Prior Level of Function : Needs assist       Physical Assist : Mobility (physical);ADLs (physical) Mobility (physical):  Transfers ADLs (physical): Toileting Mobility Comments: assist for up to transport chair to go to bathroom at [place]: Right    Extremity/Trunk Assessment   Upper Extremity Assessment Upper Extremity Assessment: Generalized weakness    Lower Extremity Assessment Lower Extremity Assessment: Generalized weakness    Cervical / Trunk Assessment Cervical / Trunk Assessment: Kyphotic  (severe kyphoscoliosis)  Communication   Communication: Other (comment) (dysarthria)  Cognition Arousal/Alertness: Awake/alert Behavior During Therapy: WFL for tasks assessed/performed (tangential) Overall Cognitive Status: History of cognitive impairments - at baseline                                 General Comments: oriented to place, time, situation, tangential giving me a synopsis of the Bible from Flood to water into wine and talking about [place].        General Comments General comments (skin integrity, edema, etc.): on 2L O2 initially with prongs out of nose SpO2 94%    Exercises     Assessment/Plan    PT Assessment Patient needs continued PT services  PT Problem List Decreased strength;Decreased mobility;Decreased balance;Decreased knowledge of use of DME;Decreased activity tolerance       PT Treatment Interventions DME instruction;Therapeutic activities;Patient/family education;Therapeutic exercise;Gait training;Balance training;Functional mobility training    PT Goals (Current goals can be found in the Care Plan section)  Acute Rehab PT Goals Patient Stated Goal: to go back to [facility] PT Goal Formulation: With patient Time For Goal Achievement: 07/22/21 Potential to Achieve Goals: Good    Frequency Min 2X/week   Barriers to discharge        Co-evaluation               AM-PAC PT "6 Clicks" Mobility  Outcome Measure Help needed turning from your back to your side while in a flat bed without using bedrails?: A Little Help needed moving from lying on your back to sitting on the side of a flat bed without using bedrails?: A Little Help needed moving to and from a bed to a chair (including a wheelchair)?: A Little Help needed standing up from a chair using your arms (e.g., wheelchair or bedside chair)?: A Lot Help needed to walk in hospital room?: Total Help needed climbing 3-5 steps with a railing? :  Total 6 Click Score: 13    End of Session Equipment Utilized During Treatment: Oxygen Activity Tolerance: Patient tolerated treatment well Patient left: in chair;with call bell/phone within reach   PT Visit Diagnosis: Other abnormalities of gait and mobility (R26.89);Muscle weakness (generalized) (M62.81)    Time: 3903-0092 PT Time Calculation (min) (ACUTE ONLY): 31 min   Charges:   PT Evaluation $PT Eval Moderate Complexity: 1 Mod PT Treatments $Therapeutic Activity: 8-22 mins        Magda Kiel, PT Acute Rehabilitation Services ZRAQT:[phone] Office:[phone] 07/08/2021   Reginia Naas 07/08/2021, 1:19 PM

## 2021-07-08 NOTE — Progress Notes (Signed)
REDS Clip  READING= 19%  Antonietta Jewel, PharmD, [place] Clinical Pharmacist  Phone: [phone] 07/08/2021 9:46 AM  Please check AMION for all [place] phone numbers After 10:00 PM, call [place] [phone]

## 2021-07-08 NOTE — NC FL2 (Signed)
[place] MEDICAID FL2 LEVEL OF CARE SCREENING TOOL     IDENTIFICATION  Patient Name: [name] Birthdate: 1936/01/27 Sex: male Admission Date (Current Location): 07/06/2021  [facility] and [place] Number:  Herbalist and Address:  [facility] [place], [place] [address], [place], [place] [address]      Provider Number: [id number]  Attending Physician Name and Address:  Dessa Phi, DO  Relative Name and Phone Number:  Jomar Denz # [phone]    Current Level of Care: Hospital Recommended Level of Care: [place] Prior Approval Number:    Date Approved/Denied:   PASRR Number: [id number]  Discharge Plan: SNF    Current Diagnoses: Patient Active Problem List   Diagnosis Date Noted   Acute on chronic systolic CHF (congestive heart failure) ([place]) 07/07/2021   Acute respiratory failure with hypoxia ([place]) 07/07/2021   Essential hypertension 07/07/2021   Venous insufficiency (chronic) (peripheral) 06/16/2018   Chronic pain of left ankle 06/16/2018   Right shoulder pain 10/17/2017   CKD (chronic kidney disease) stage 4, GFR 15-29 ml/min (HCC) 10/17/2017   Closed fracture of left Clavicle 08/19/17 09/24/2017   COPD (chronic obstructive pulmonary disease) (HCC)    Hematuria, microscopic    Weakness 04/24/2017   Mucoid cyst, joint 05/12/2016   Primary osteoarthritis of first carpometacarpal joint of left hand 02/18/2016   Acquired hypothyroidism 05/19/2012   Benign fibroma of prostate 05/19/2012   Block, bundle branch, left 05/19/2012   Long term (current) use of anticoagulants 01/31/2011   Rash 01/13/2011   Paroxysmal atrial flutter (HCC) 17/51/0258   Chronic systolic heart failure (HCC) 12/02/2010   Mixed hyperlipidemia 11/08/2010   Secondary cardiomyopathy ([place]) 11/08/2010   Left bundle branch block (LBBB) determined by electrocardiography 11/08/2010    Orientation RESPIRATION BLADDER Height & Weight     Self,  Time, Situation, Place  Normal Incontinent Weight: 59.6 kg Height:  5\' 4"  (162.6 cm)  BEHAVIORAL SYMPTOMS/MOOD NEUROLOGICAL BOWEL NUTRITION STATUS      Incontinent Diet-Heart Healthy-1500 ml fluid restriction  AMBULATORY STATUS COMMUNICATION OF NEEDS Skin   Extensive Assist Verbally Normal                       Personal Care Assistance Level of Assistance  Bathing, Feeding, Dressing Bathing Assistance: Limited assistance Feeding assistance: Limited assistance Dressing Assistance: Maximum assistance     Functional Limitations Info  Sight, Hearing, Speech Sight Info: Impaired (glasses) Hearing Info: Adequate Speech Info: Adequate    SPECIAL CARE FACTORS FREQUENCY  PT (By licensed PT), OT (By licensed OT)     PT Frequency: 5x per week OT Frequency: 5x per week            Contractures Contractures Info: Not present    Additional Factors Info  Code Status, Allergies Code Status Info: DNR Allergies Info: No Known allergies           Current Medications (07/08/2021):  This is the current hospital active medication list Current Facility-Administered Medications  Medication Dose Route Frequency Provider Last Rate Last Admin   acetaminophen (TYLENOL) tablet 650 mg  650 mg Oral Q4H PRN Shalhoub, Sherryll Burger, MD       amiodarone (PACERONE) tablet 200 mg  200 mg Oral Daily Shalhoub, Sherryll Burger, MD   200 mg at 07/08/21 0912   apixaban (ELIQUIS) tablet 2.5 mg  2.5 mg Oral BID Vernelle Emerald, MD   2.5 mg at 07/08/21 0912   hydrALAZINE (  APRESOLINE) injection 10 mg  10 mg Intravenous Q6H PRN Shalhoub, Sherryll Burger, MD       hydrALAZINE (APRESOLINE) tablet 25 mg  25 mg Oral TID Vernelle Emerald, MD   25 mg at 07/07/21 1722   isosorbide mononitrate (IMDUR) 24 hr tablet 30 mg  30 mg Oral q morning Shalhoub, Sherryll Burger, MD   30 mg at 07/08/21 0912   levothyroxine (SYNTHROID) tablet 150 mcg  150 mcg Oral Q0600 Vernelle Emerald, MD   150 mcg at 07/08/21 3664     Discharge  Medications: Please see discharge summary for a list of discharge medications.  Relevant Imaging Results:  Relevant Lab Results:   Additional Information # [id number]  Erenest Rasher, RN

## 2021-07-08 NOTE — Plan of Care (Signed)

## 2021-07-08 NOTE — TOC Initial Note (Addendum)
Transition of Care [facility]) - Initial/Assessment Note    Patient Details  Name: [name] MRN: [id number] Date of Birth: [birth year]  [facility] CM/SW Contact:    Erenest Rasher, RN Phone Number: [phone] 07/08/2021, 12:24 PM  Clinical Narrative:                 HF TOC CM spoke to pt at bedside and states his facility [facility] will have a SNF rehab bed available for him. He currently lives in [place]. Contacted [facility] rep, [place] for SNF rehab bed.   TOC CM spoke to Butch Penny at [facility] and they will have bed for pt at dc on 07/09/2021. Number to call report # [phone]. Will need COVID test, FL2 and dc summary.   Expected Discharge Plan: Skilled Nursing Facility Barriers to Discharge: Continued Medical Work up   Patient Goals and CMS Choice Patient states their goals for this hospitalization and ongoing recovery are:: requesting rehab bed CMS Medicare.gov Compare Post Acute Care list provided to:: Patient Choice offered to / list presented to : Patient  Expected Discharge Plan and Services Expected Discharge Plan: [place] In-house Referral: Clinical Social Work Discharge Planning Services: CM Consult Post Acute Care Choice: [place] Living arrangements for the past 2 months: [place]                                      Prior Living Arrangements/Services Living arrangements for the past 2 months: [place] Lives with:: Facility Resident Patient language and need for interpreter reviewed:: Yes        Need for Family Participation in Patient Care: Yes (Comment) Care giver support system in place?: Yes (comment) Current home services: DME (roling walker, wheelchair) Criminal Activity/Legal Involvement Pertinent to Current Situation/Hospitalization: No - Comment as needed  Activities of Daily Living Home Assistive Devices/Equipment: Shower chair with  back ADL Screening (condition at time of admission) Patient's cognitive ability adequate to safely complete daily activities?: Yes Is the patient deaf or have difficulty hearing?: No Does the patient have difficulty seeing, even when wearing glasses/contacts?: No Does the patient have difficulty concentrating, remembering, or making decisions?: No Patient able to express need for assistance with ADLs?: Yes Does the patient have difficulty dressing or bathing?: No Independently performs ADLs?: Yes (appropriate for developmental age) Does the patient have difficulty walking or climbing stairs?: No Weakness of Legs: Both Weakness of Arms/Hands: Both  Permission Sought/Granted Permission sought to share information with : Case Manager, Family Supports, Chartered certified accountant granted to share information with : Yes, Verbal Permission Granted              Emotional Assessment Appearance:: Appears stated age Attitude/Demeanor/Rapport: Gracious, Engaged Affect (typically observed): Accepting Orientation: : Oriented to Self, Oriented to Place, Oriented to  Time, Oriented to Situation   Psych Involvement: No (comment)  Admission diagnosis:  Shortness of breath [R06.02] Patient Active Problem List   Diagnosis Date Noted   Acute on chronic systolic CHF (congestive heart failure) ([place]) 07/07/2021   Acute respiratory failure with hypoxia ([place]) 07/07/2021   Essential hypertension 07/07/2021   Venous insufficiency (chronic) (peripheral) 06/16/2018   Chronic pain of left ankle 06/16/2018   Right shoulder pain 10/17/2017   CKD (chronic kidney disease) stage 4, GFR 15-29 ml/min (HCC) 10/17/2017   Closed fracture of left Clavicle  08/19/17 09/24/2017   COPD (chronic obstructive pulmonary disease) (HCC)    Hematuria, microscopic    Weakness 04/24/2017   Mucoid cyst, joint 05/12/2016   Primary osteoarthritis of first carpometacarpal joint of left hand 02/18/2016   Acquired  hypothyroidism 05/19/2012   Benign fibroma of prostate 05/19/2012   Block, bundle branch, left 05/19/2012   Long term (current) use of anticoagulants 01/31/2011   Rash 01/13/2011   Paroxysmal atrial flutter ([place]) 75/06/2584   Chronic systolic heart failure ([place]) 12/02/2010   Mixed hyperlipidemia 11/08/2010   Secondary cardiomyopathy ([place]) 11/08/2010   Left bundle branch block (LBBB) determined by electrocardiography 11/08/2010   PCP:  [name], MD Pharmacy:   [facility], [address] [place] [address] Phone: [phone] Fax: [phone]     Social Determinants of Health (SDOH) Interventions    Readmission Risk Interventions No flowsheet data found.

## 2021-07-09 DIAGNOSIS — I5023 Acute on chronic systolic (congestive) heart failure: Secondary | ICD-10-CM | POA: Diagnosis not present

## 2021-07-09 DIAGNOSIS — J9601 Acute respiratory failure with hypoxia: Secondary | ICD-10-CM | POA: Diagnosis present

## 2021-07-09 DIAGNOSIS — R0602 Shortness of breath: Secondary | ICD-10-CM | POA: Diagnosis present

## 2021-07-09 DIAGNOSIS — R0902 Hypoxemia: Secondary | ICD-10-CM | POA: Diagnosis not present

## 2021-07-09 DIAGNOSIS — I130 Hypertensive heart and chronic kidney disease with heart failure and stage 1 through stage 4 chronic kidney disease, or unspecified chronic kidney disease: Secondary | ICD-10-CM | POA: Diagnosis present

## 2021-07-09 DIAGNOSIS — I4892 Unspecified atrial flutter: Secondary | ICD-10-CM | POA: Diagnosis present

## 2021-07-09 DIAGNOSIS — J9600 Acute respiratory failure, unspecified whether with hypoxia or hypercapnia: Secondary | ICD-10-CM | POA: Diagnosis not present

## 2021-07-09 DIAGNOSIS — Z823 Family history of stroke: Secondary | ICD-10-CM | POA: Diagnosis not present

## 2021-07-09 DIAGNOSIS — Z79890 Hormone replacement therapy (postmenopausal): Secondary | ICD-10-CM | POA: Diagnosis not present

## 2021-07-09 DIAGNOSIS — Z833 Family history of diabetes mellitus: Secondary | ICD-10-CM | POA: Diagnosis not present

## 2021-07-09 DIAGNOSIS — I480 Paroxysmal atrial fibrillation: Secondary | ICD-10-CM | POA: Diagnosis present

## 2021-07-09 DIAGNOSIS — I5043 Acute on chronic combined systolic (congestive) and diastolic (congestive) heart failure: Secondary | ICD-10-CM | POA: Diagnosis present

## 2021-07-09 DIAGNOSIS — Z8249 Family history of ischemic heart disease and other diseases of the circulatory system: Secondary | ICD-10-CM | POA: Diagnosis not present

## 2021-07-09 DIAGNOSIS — I428 Other cardiomyopathies: Secondary | ICD-10-CM | POA: Diagnosis present

## 2021-07-09 DIAGNOSIS — Z7901 Long term (current) use of anticoagulants: Secondary | ICD-10-CM | POA: Diagnosis not present

## 2021-07-09 DIAGNOSIS — Z20822 Contact with and (suspected) exposure to covid-19: Secondary | ICD-10-CM | POA: Diagnosis present

## 2021-07-09 DIAGNOSIS — Z7982 Long term (current) use of aspirin: Secondary | ICD-10-CM | POA: Diagnosis not present

## 2021-07-09 DIAGNOSIS — R001 Bradycardia, unspecified: Secondary | ICD-10-CM | POA: Diagnosis present

## 2021-07-09 DIAGNOSIS — R5383 Other fatigue: Secondary | ICD-10-CM | POA: Diagnosis not present

## 2021-07-09 DIAGNOSIS — Z808 Family history of malignant neoplasm of other organs or systems: Secondary | ICD-10-CM | POA: Diagnosis not present

## 2021-07-09 DIAGNOSIS — Z66 Do not resuscitate: Secondary | ICD-10-CM | POA: Diagnosis not present

## 2021-07-09 DIAGNOSIS — Z8261 Family history of arthritis: Secondary | ICD-10-CM | POA: Diagnosis not present

## 2021-07-09 DIAGNOSIS — J449 Chronic obstructive pulmonary disease, unspecified: Secondary | ICD-10-CM | POA: Diagnosis present

## 2021-07-09 DIAGNOSIS — E782 Mixed hyperlipidemia: Secondary | ICD-10-CM | POA: Diagnosis present

## 2021-07-09 DIAGNOSIS — Z79899 Other long term (current) drug therapy: Secondary | ICD-10-CM | POA: Diagnosis not present

## 2021-07-09 DIAGNOSIS — E039 Hypothyroidism, unspecified: Secondary | ICD-10-CM | POA: Diagnosis present

## 2021-07-09 DIAGNOSIS — N179 Acute kidney failure, unspecified: Secondary | ICD-10-CM | POA: Diagnosis present

## 2021-07-09 DIAGNOSIS — N184 Chronic kidney disease, stage 4 (severe): Secondary | ICD-10-CM | POA: Diagnosis present

## 2021-07-09 DIAGNOSIS — R4189 Other symptoms and signs involving cognitive functions and awareness: Secondary | ICD-10-CM | POA: Diagnosis present

## 2021-07-09 DIAGNOSIS — Z7401 Bed confinement status: Secondary | ICD-10-CM | POA: Diagnosis not present

## 2021-07-09 LAB — BASIC METABOLIC PANEL
Anion gap: 10 (ref 5–15)
BUN: 44 mg/dL — ABNORMAL HIGH (ref 8–23)
CO2: 27 mmol/L (ref 22–32)
Calcium: 7.9 mg/dL — ABNORMAL LOW (ref 8.9–10.3)
Chloride: 97 mmol/L — ABNORMAL LOW (ref 98–111)
Creatinine, Ser: 2.25 mg/dL — ABNORMAL HIGH (ref 0.61–1.24)
GFR, Estimated: 28 mL/min — ABNORMAL LOW (ref >60)
Glucose, Bld: 98 mg/dL (ref 70–99)
Potassium: 4.3 mmol/L (ref 3.5–5.1)
Sodium: 134 mmol/L — ABNORMAL LOW (ref 135–145)

## 2021-07-09 MED ORDER — POLYETHYLENE GLYCOL 3350 17 G PO PACK: 17.0000 g | PACK | Freq: Every day | ORAL | Status: DC

## 2021-07-09 MED ORDER — FUROSEMIDE 40 MG PO TABS
40.0000 mg | ORAL_TABLET | Freq: Every day | ORAL | Status: DC
  Administered 2021-07-09: 40 mg via ORAL
  Filled 2021-07-09: qty 1

## 2021-07-09 MED ORDER — SPIRONOLACTONE 25 MG PO TABS
25.0000 mg | ORAL_TABLET | Freq: Every day | ORAL | Status: DC
  Administered 2021-07-09: 25 mg via ORAL
  Filled 2021-07-09: qty 1

## 2021-07-09 NOTE — Progress Notes (Addendum)
Advanced Heart Failure Rounding Note  PCP-Cardiologist: None   Subjective:    Diuretics held yesterday. Wt down an additional 2 lb.   Scr trending back down, 2.05>>2.71>>2.25.   BP ok off losartan and spiro, low 025K systolic.   Sitting up in bed eating breakfast. Feels well. Denies dyspnea. No CP.    Objective:   Weight Range: 58.9 kg Body mass index is 22.29 kg/m.   Vital Signs:   Temp:  [97.8 F (36.6 C)-98.7 F (37.1 C)] 98.5 F (36.9 C) (10/25 0400) Pulse Rate:  [60-69] 64 (10/25 0400) Resp:  [17-20] 20 (10/24 2003) BP: (104-119)/(39-49) 104/49 (10/25 0400) SpO2:  [97 %-99 %] 98 % (10/25 0400) Weight:  [58.9 kg] 58.9 kg (10/25 0009) Last BM Date: 07/06/21  Weight change: Filed Weights   07/07/21 1545 07/08/21 0412 07/09/21 0009  Weight: 60.4 kg 59.6 kg 58.9 kg    Intake/Output:   Intake/Output Summary (Last 24 hours) at 07/09/2021 0722 Last data filed at 07/08/2021 1831 Gross per 24 hour  Intake 837 ml  Output 1150 ml  Net -313 ml      Physical Exam    General:  Well appearing elderly WM. No resp difficulty HEENT: Normal Neck: Supple. No JVP . Carotids 2+ bilat; no bruits. No lymphadenopathy or thyromegaly appreciated. Cor: PMI nondisplaced. Regular rate & rhythm. No rubs, gallops or murmurs. Lungs: Clear Abdomen: Soft, nontender, nondistended. No hepatosplenomegaly. No bruits or masses. Good bowel sounds. Extremities: No cyanosis, clubbing, rash, edema Neuro: Alert & orientedx3, cranial nerves grossly intact. moves all 4 extremities w/o difficulty. Affect pleasant   Telemetry   NSR 80s.   EKG    No new EKG to review   Labs    CBC Recent Labs    07/06/21 2344 07/07/21 0050 07/07/21 0425 07/08/21 0357  WBC 10.5  --  12.2* 10.3  NEUTROABS 8.3*  --  9.7*  --   HGB 10.3*   < > 11.0* 10.3*  HCT 32.6*   < > 34.1* 32.1*  MCV 100.6*  --  100.0 97.9  PLT 334  --  341 336   < > = values in this interval not displayed.   Basic  Metabolic Panel Recent Labs    07/07/21 0425 07/08/21 0357 07/09/21 0341  NA 133* 134* 134*  K 4.8 4.5 4.3  CL 103 100 97*  CO2 22 26 27   GLUCOSE 118* 100* 98  BUN 34* 40* 44*  CREATININE 2.05* 2.71* 2.25*  CALCIUM 7.8* 7.9* 7.9*  MG 1.9  --   --    Liver Function Tests Recent Labs    07/06/21 2344 07/07/21 0425  AST 39 36  ALT 28 30  ALKPHOS 89 85  BILITOT 0.4 0.6  PROT 6.5 6.4*  ALBUMIN 2.3* 2.2*   No results for input(s): LIPASE, AMYLASE in the last 72 hours. Cardiac Enzymes No results for input(s): CKTOTAL, CKMB, CKMBINDEX, TROPONINI in the last 72 hours.  BNP: BNP (last 3 results) Recent Labs    07/06/21 2344  BNP 901.6*    ProBNP (last 3 results) No results for input(s): PROBNP in the last 8760 hours.   D-Dimer No results for input(s): DDIMER in the last 72 hours. Hemoglobin A1C No results for input(s): HGBA1C in the last 72 hours. Fasting Lipid Panel No results for input(s): CHOL, HDL, LDLCALC, TRIG, CHOLHDL, LDLDIRECT in the last 72 hours. Thyroid Function Tests No results for input(s): TSH, T4TOTAL, T3FREE, THYROIDAB in the last 72 hours.  Invalid input(s): FREET3  Other results:   Imaging    No results found.   Medications:     Scheduled Medications:  amiodarone  200 mg Oral Daily   apixaban  2.5 mg Oral BID   hydrALAZINE  25 mg Oral TID   isosorbide mononitrate  30 mg Oral q morning   levothyroxine  150 mcg Oral Q0600    Infusions:   PRN Medications: acetaminophen, hydrALAZINE   Assessment/Plan   A/C Combined Systolic Heart Failure , NICM  - Cath 2013 no CAD -Echo 2020 EF 40-45%--> this admit EF 30-35% with LBBB. He/Family refused ICD/CRT-D dating back to 20212. HS Trop no trend.  EF worse. Would not pursue cath with elevated creatinine and no chest pain.  - Well diuresed w/ IV lasix. Euvolemic to dry. ReDs clip 19 yesterday. Diuretics now on hold w/ SCr bump - Continue to hold lasix today  - No SGLT2i with volume  depletion.  - Continue to hold losartan and spiro for now  - No bb with know bradycardia.  - Continue hydralazine 25 mg tid + imdur  - Follow BMET daily    2. AKI on CKD Stage IV - Creatinine baseline 2-2.5 - Creatinine on admit 2.2-->2.7->2.3 today  - Continue to hold lasix, spiro and losartan today    3. PAF - In SR on admit.  - Continue amio 200 mg daily  - Continue eliquis 2.5 mg twice a day. Reduced dose with age and elevated creatinine    4. Hypothyroidism  - On Levothyroxine.    5. Deconditioning  - Ambulate w/ PT - Plan return to SNF    6. DNR/DNI     Length of Stay: 0  Lyda Jester, PA-C  07/09/2021, 7:22 AM  Advanced Heart Failure Team Pager [phone] (M-F; 7a - 5p)  Please contact [facility] for night-coverage after hours (5p -7a ) and weekends on [email or web address]   Patient seen and examined with the above-signed Advanced Practice Provider and/or Housestaff. I personally reviewed laboratory data, imaging studies and relevant notes. I independently examined the patient and formulated the important aspects of the plan. I have edited the note to reflect any of my changes or salient points. I have personally discussed the plan with the patient and/or family.  Feels much better. Denies SOB, orthopnea or PND. SCr improving  General:  Elderly. Kyphotic No resp difficulty HEENT: normal Neck: supple. no JVD. Carotids 2+ bilat; no bruits. No lymphadenopathy or thryomegaly appreciated. Cor: PMI nondisplaced. Regular rate & rhythm. No rubs, gallops or murmurs. Lungs: clear Abdomen: soft, nontender, nondistended. No hepatosplenomegaly. No bruits or masses. Good bowel sounds. Extremities: no cyanosis, clubbing, rash, edema Neuro: alert & orientedx3, cranial nerves grossly intact. moves all 4 extremities w/o difficulty. Affect pleasant   Ok for d/c today. Can restart all home HF meds except for losartan. We will arrange HF f/u.  Glori Bickers, MD  7:38 AM

## 2021-07-09 NOTE — Progress Notes (Signed)
Pt. Was pleasant and cooperative during session. Pt. Has decreased ability with ADLs and transfers. Pt. States he was able to dress self previously and is now having more difficulty. Pt. Is motivated to work with OT to improve function. Acute OT to follow.   07/09/21 0800  OT Visit Information  Last OT Received On 07/09/21  Assistance Needed +1  History of Present Illness [name] is an 85 year old male with past medical history of chronic kidney disease stage IV, systolic congestive heart failure (Echo 07/2019 EF as low as 15-20% but latest 40-45%), atrial flutter (S/P DCCV 12/2010), hypertension, hypothyroidism, left bundle branch block and chronic kidney disease stage IV admitted with shortness of breath and weakness due to LL PE & CHF exacerbation.  Precautions  Precautions Fall  Precaution Comments new O2  Restrictions  Weight Bearing Restrictions No  Home Living  Family/patient expects to be discharged to: Assisted living  Home Equipment Wheelchair - manual  Prior Function  Prior Level of Function  Needs assist  Physical Assist  Mobility (physical);ADLs (physical)  Mobility (physical) Transfers  ADLs (physical) Toileting  Mobility Comments assist for up to transport chair to go to bathroom at ALF  ADLs Comments Pt. reports that they would assist wtih shower but he could dress himself.  Communication  Communication  (difficult to understand speech)  Pain Assessment  Pain Assessment No/denies pain  Cognition  Arousal/Alertness Awake/alert  Behavior During Therapy WFL for tasks assessed/performed  Overall Cognitive Status History of cognitive impairments - at baseline  General Comments oriented to place, time, situation, tangential giving me a synopsis of the Bible from [place] to water into wine and talking about [place] in [place].  Upper Extremity Assessment  Upper Extremity Assessment Generalized weakness (Has some joint deformity in b hands.)   Lower Extremity Assessment  Lower Extremity Assessment Defer to PT evaluation  Cervical / Trunk Assessment  Cervical / Trunk Assessment Kyphotic  Vision- History  Baseline Vision/History 1 Wears glasses  Ability to See in Adequate Light 0 Adequate  Patient Visual Report No change from baseline  Vision- Assessment  Vision Assessment? No apparent visual deficits  ADL  Overall ADL's  Needs assistance/impaired  Eating/Feeding Modified independent  Grooming Wash/dry hands;Wash/dry face;Oral care;Set up;Sitting  Upper Body Bathing Minimal assistance;Sitting  Lower Body Bathing Moderate assistance;Sit to/from stand  Upper Body Dressing  Minimal assistance;Sitting  Lower Body Dressing Moderate assistance;Sit to/from stand  Toilet Transfer Moderate assistance;Stand-pivot  Toileting- Clothing Manipulation and Hygiene Maximal assistance;Sit to/from stand  Functional mobility during ADLs Moderate assistance  General ADL Comments Pt. states he is weaker than before but was able to dress himself previosulsy.  Bed Mobility  Overal bed mobility Needs Assistance  Bed Mobility Supine to Sit  Supine to sit Min assist;HOB elevated  General bed mobility comments able to maintain balance while scooting.  Transfers  Overall transfer level Needs assistance  Equipment used Rolling walker (2 wheels)  Transfers Sit to/from [employer] Transfers  Sit to Stand Mod assist  Stand pivot transfers Min assist  General transfer comment cues for proper hand placemnt.  Balance  Sitting balance-Leahy Scale Fair  Standing balance-Leahy Scale Poor  General Comments  General comments (skin integrity, edema, etc.) on 2l of o2.  OT - End of Session  Equipment Utilized During Treatment Oxygen  Activity Tolerance Patient tolerated treatment well  Patient left in chair;with call bell/phone within reach;with chair alarm set  Nurse Communication  (ok therapy)  OT Assessment  OT Recommendation/Assessment  Patient needs continued OT Services  OT Visit Diagnosis Unsteadiness on feet (R26.81);Muscle weakness (generalized) (M62.81)  OT Problem List Decreased strength;Decreased activity tolerance;Impaired balance (sitting and/or standing);Decreased knowledge of use of DME or AE;Decreased knowledge of precautions  OT Plan  OT Frequency (ACUTE ONLY) Min 2X/week  OT Treatment/Interventions (ACUTE ONLY) Self-care/ADL training;DME and/or AE instruction;Therapeutic activities;Patient/family education  AM-PAC OT "6 Clicks" Daily Activity Outcome Measure (Version 2)  Help from another person eating meals? 3  Help from another person taking care of personal grooming? 3  Help from another person toileting, which includes using toliet, bedpan, or urinal? 2  Help from another person bathing (including washing, rinsing, drying)? 2  Help from another person to put on and taking off regular upper body clothing? 3  Help from another person to put on and taking off regular lower body clothing? 2  6 Click Score 15  Progressive Mobility  What is the highest level of mobility based on the progressive mobility assessment? Level 3 (Stands with assist) - Balance while standing  and cannot march in place  Mobility Out of bed to chair with meals  OT Recommendation  Follow Up Recommendations Skilled nursing-short term rehab (<3 hours/day)  Assistance recommended at discharge Frequent or constant Supervision/Assistance  Functional Status Assessent Patient has had a recent decline in their functional status and demonstrates the ability to make significant improvements in function in a reasonable and predictable amount of time.  OT Equipment None recommended by OT  Individuals Consulted  Consulted and Agree with Results and Recommendations Patient  Acute Rehab OT Goals  Patient Stated Goal to get stronger  OT Goal Formulation With patient  Time For Goal Achievement 07/23/21  Potential to Achieve Goals Good  OT Time  Calculation  OT Start Time (ACUTE ONLY) 0844  OT Stop Time (ACUTE ONLY) 0915  OT Time Calculation (min) 31 min  OT General Charges  $OT Visit 1 Visit  OT Evaluation  $OT Eval Moderate Complexity 1 Mod  Written Expression  Dominant Hand Right  Reece Packer OT/L

## 2021-07-09 NOTE — Progress Notes (Signed)
Report called to [facility]. IV and telemetry removed.

## 2021-07-09 NOTE — TOC CM/SW Note (Addendum)
HF TOC CM spoke to Butch Penny, [employer]. She has received dc summary, COVID test and FL2. His bed is ready, Unit RN can call report to # [phone]. PTAR arranged. [place], Heart Failure TOC CM [phone]

## 2021-07-09 NOTE — Discharge Summary (Signed)
Physician Discharge Summary  [name] VVO:[id number] DOB: [birth year] DOA: 07/06/2021  PCP: [name], MD  Admit date: 07/06/2021 Discharge date: 07/09/2021  Admitted From: ALF Disposition:  SNF  Recommendations for Outpatient Follow-up:  Follow up with Dr. Haroldine Laws, advanced heart failure team.  They will arrange for follow-up.  Discharge Condition: Stable, improved CODE STATUS: DNR Diet recommendation: Heart healthy diet  Brief/Interim Summary: [name] is an 85 year old male with past medical history of chronic kidney disease stage IV, systolic congestive heart failure (Echo 07/2019 EF as low as 15-20% but latest 40-45%), atrial flutter (S/P DCCV 12/2010), hypertension, hypothyroidism, left bundle branch block and chronic kidney disease stage IV presents to [facility] emergency department via EMS with shortness of breath and weakness.   Patient explains that for at least the past week he has been developing increasing shortness of breath.  Shortness of breath progressively became more more severe as the days progressed.  Shortness breath is worse with exertion and improved with rest.  Symptoms have additionally been associated with ongoing nonproductive cough and generalized weakness.     In the emergency department patient was found to have a markedly elevated BNP of 901 with evidence of bilateral patchy infiltrates on chest x-ray concerning for pulmonary edema.  Patient was administered 40 mg of intravenous Lasix for suspected acute congestive heart failure.  Cardiology was consulted.   With diuresis, patient's creatinine worsened.  Lasix and ARB were held with improvement in creatinine prior to discharge.  Cardiology evaluated patient and patient had improved, they recommended discharge and to follow-up as outpatient.  Discharge Diagnoses:  Principal Problem:   Acute on chronic systolic CHF (congestive heart failure) (HCC) Active Problems:    Paroxysmal atrial flutter (HCC)   COPD (chronic obstructive pulmonary disease) (HCC)   CKD (chronic kidney disease) stage 4, GFR 15-29 ml/min (HCC)   Acquired hypothyroidism   Acute respiratory failure with hypoxia (HCC)   Essential hypertension   Shortness of breath   Acute on chronic combined systolic/diastolic heart failure  -Symptoms of increasing shortness of breath, dyspnea on exertion, peripheral edema worsening in setting of diet/fluid noncompliance -Echo on 10/23 showed EF 26-94%, grade 2 diastolic dysfunction -Strict I/O's, daily weights, fluid restriction diet -Appreciate cardiology/heart failure team -Resume home medications   Acute hypoxemic respiratory failure -Patient with hypoxia with new oxygen requirement -Remains on 2 L oxygen   Hypertension -ARB on hold due to elevated creatinine -Continue hydralazine, Imdur  AKI on CKD stage IV -Baseline creatinine 2-2.4  -Improved, continue to hold ARB   Paroxysmal atrial flutter -Continue Eliquis, amiodarone   Hypothyroidism -Continue Synthroid  Discharge Instructions  Discharge Instructions     Diet - low sodium heart healthy   Complete by: As directed    Discharge instructions   Complete by: As directed    You were cared for by a hospitalist during your hospital stay. If you have any questions about your discharge medications or the care you received while you were in the hospital after you are discharged, you can call the unit and ask to speak with the hospitalist on call if the hospitalist that took care of you is not available. Once you are discharged, your primary care physician will handle any further medical issues. Please note that NO REFILLS for any discharge medications will be authorized once you are discharged, as it is imperative that you return to your primary care physician (or establish a relationship with a primary care physician if  you do not have one) for your aftercare needs so that they can  reassess your need for medications and monitor your lab values.   Increase activity slowly   Complete by: As directed       Allergies as of 07/09/2021   No Known Allergies      Medication List     STOP taking these medications    losartan 25 MG tablet Commonly known as: COZAAR       TAKE these medications    amiodarone 200 MG tablet Commonly known as: PACERONE TAKE 1 TABLET BY MOUTH ONCE DAILY   ammonium lactate 12 % cream Commonly known as: AMLACTIN Apply 1 application topically as needed for dry skin.   apixaban 2.5 MG Tabs tablet Commonly known as: Eliquis Take 1 tablet (2.5 mg total) by mouth 2 (two) times daily. Please cancel all previous orders for current medication. Change in dosage or pill size.   aspirin EC 81 MG tablet Take 81 mg by mouth daily.   furosemide 40 MG tablet Commonly known as: LASIX Take 40 mg by mouth daily.   hydrALAZINE 25 MG tablet Commonly known as: APRESOLINE TAKE 1 TABLET BY MOUTH THREE TIMES DAILY   hydrocortisone ointment 0.5 % Apply 1 application topically 2 (two) times daily as needed for itching. Right thumb   hydrocortisone valerate cream 0.2 % Commonly known as: WESTCORT Apply 1 application topically 2 (two) times daily. To affected areas of ear, scalp, hair, and nostrils/nose   isosorbide mononitrate 30 MG 24 hr tablet Commonly known as: IMDUR TAKE 1 TABLET BY MOUTH ONCE DAILY IN THE MORNING What changed: when to take this   ketoconazole 2 % cream Commonly known as: NIZORAL Apply 1 application topically 2 (two) times daily. To affected areas of ear, scalp, hair, and nostrils/nose   ketoconazole 2 % shampoo Commonly known as: NIZORAL Apply 1 application topically 2 (two) times a week. With hair washing on Wednesday's and Saturday's   levothyroxine 150 MCG tablet Commonly known as: SYNTHROID Take 150 mcg by mouth daily before breakfast.   loratadine 10 MG tablet Commonly known as: CLARITIN Take 10 mg by  mouth daily.   polyethylene glycol 17 g packet Commonly known as: MIRALAX / GLYCOLAX Take 17 g by mouth daily as needed for mild constipation.   spironolactone 25 MG tablet Commonly known as: ALDACTONE TAKE ONE TABLET BY MOUTH ONCE DAILY What changed: when to take this               Durable Medical Equipment  (From admission, onward)           Start     Ordered   07/09/21 1017  DME Oxygen  Once       Question Answer Comment  Length of Need 6 Months   Mode or (Route) Nasal cannula   Liters per Minute 2   Oxygen delivery system Gas      07/09/21 1016            Follow-up Information     [facility] Follow up.   Specialty: Cardiology Why: 11/4 at 3:30 PM the [facility] [place] information: [address] [id number] [place]               No Known Allergies  Consultations: Cardiology   Procedures/Studies: [facility] Chest Port 1 View  Result Date: 07/07/2021 CLINICAL DATA:  Weakness and  shortness of breath. EXAM: PORTABLE CHEST 1 VIEW COMPARISON:  October 22, 2017 FINDINGS: The lungs are hyperinflated. Diffuse, chronic appearing increased interstitial lung markings are seen. Mild areas of atelectasis and/or infiltrate are seen involving the mid left lung, bilateral lung bases and right upper lobe. There is no evidence of a pleural effusion or pneumothorax. Evaluation of the superior mediastinum is limited secondary to patient positioning. The cardiac silhouette is moderately enlarged. Radiopaque surgical clips are seen within the left upper quadrant. Degenerative changes seen throughout the thoracic spine. IMPRESSION: Chronic appearing increased interstitial lung markings with mild areas of bilateral atelectasis and/or infiltrate. Electronically Signed   By: Virgina Norfolk M.D.   On: 07/07/2021 00:11   ECHOCARDIOGRAM  COMPLETE  Result Date: 07/07/2021    ECHOCARDIOGRAM REPORT   Patient Name:   [name] Date of Exam: 07/07/2021 Medical Rec #:  [id number]        Height:       64.0 in Accession #:    [id number]       Weight:       145.0 lb Date of Birth:  [birth year]       BSA:          1.706 m Patient Age:    80 years         BP:           107/55 mmHg Patient Gender: M                HR:           72 bpm. Exam Location:  Inpatient Procedure: 2D Echo, Cardiac Doppler and Color Doppler Indications:    CHF  History:        Patient has prior history of Echocardiogram examinations, most                 recent 07/28/2019. Arrythmias:LBBB; Risk Factors:Dyslipidemia.  Sonographer:    Maudry Mayhew [place], RVT, RDCS Referring Phys: [id number] [facility]  Sonographer Comments: No subcostal window. IMPRESSIONS  1. Left ventricular ejection fraction, by estimation, is 30 to 35%. The left ventricle has moderately decreased function. The left ventricle demonstrates regional wall motion abnormalities (see scoring diagram/findings for description). The left ventricular internal cavity size was mildly dilated. Left ventricular diastolic parameters are consistent with Grade II diastolic dysfunction (pseudonormalization). Elevated left ventricular end-diastolic pressure.  2. Right ventricular systolic function is mildly reduced. The right ventricular size is normal.  3. Left atrial size was moderately dilated.  4. Right atrial size was mild to moderately dilated.  5. The mitral valve is normal in structure. Mild mitral valve regurgitation. No evidence of mitral stenosis.  6. The aortic valve was not well visualized. Aortic valve regurgitation is mild to moderate. Comparison(s): Changes from prior study are noted. Conclusion(s)/Recommendation(s): EF reduced compared to prior. Global hypokinesis with akinesis/dyskinesis of the entire septum, out of proportion to dyssychrony from LBBB. FINDINGS  Left Ventricle: Left ventricular  ejection fraction, by estimation, is 30 to 35%. The left ventricle has moderately decreased function. The left ventricle demonstrates regional wall motion abnormalities. The left ventricular internal cavity size was mildly dilated. There is no left ventricular hypertrophy. Abnormal (paradoxical) septal motion, consistent with left bundle branch block. Left ventricular diastolic parameters are consistent with Grade II diastolic dysfunction (pseudonormalization). Elevated left ventricular end-diastolic pressure.  LV Wall Scoring: The anterior septum is dyskinetic. The inferior septum is akinetic. The entire anterior wall, entire lateral wall, entire inferior wall, and apex  are hypokinetic. Right Ventricle: The right ventricular size is normal. Right vetricular wall thickness was not well visualized. Right ventricular systolic function is mildly reduced. Left Atrium: Left atrial size was moderately dilated. Right Atrium: Right atrial size was mild to moderately dilated. Pericardium: There is no evidence of pericardial effusion. Mitral Valve: The mitral valve is normal in structure. Mild mitral valve regurgitation. No evidence of mitral valve stenosis. Tricuspid Valve: The tricuspid valve is normal in structure. Tricuspid valve regurgitation is trivial. No evidence of tricuspid stenosis. Aortic Valve: The aortic valve was not well visualized. Aortic valve regurgitation is mild to moderate. Aortic regurgitation PHT measures 422 msec. Aortic valve mean gradient measures 4.0 mmHg. Aortic valve peak gradient measures 7.0 mmHg. Aortic valve area, by VTI measures 1.56 cm. Pulmonic Valve: The pulmonic valve was not well visualized. Pulmonic valve regurgitation is not visualized. Aorta: The aortic root is normal in size and structure, the aortic arch was not well visualized and the ascending aorta was not well visualized. IAS/Shunts: The atrial septum is grossly normal.  LEFT VENTRICLE PLAX 2D LVIDd:         5.75 cm    Diastology LVIDs:         4.90 cm   LV e' medial:    4.12 cm/s LV PW:         0.70 cm   LV E/e' medial:  30.3 LV IVS:        0.60 cm   LV e' lateral:   8.93 cm/s LVOT diam:     1.70 cm   LV E/e' lateral: 14.0 LV SV:         44 LV SV Index:   26 LVOT Area:     2.27 cm  RIGHT VENTRICLE TAPSE (M-mode): 1.5 cm LEFT ATRIUM             Index        RIGHT ATRIUM           Index LA diam:        3.50 cm 2.05 cm/m   RA Area:     17.20 cm LA Vol (A2C):   48.9 ml 28.66 ml/m  RA Volume:   48.10 ml  28.19 ml/m LA Vol (A4C):   73.1 ml 42.84 ml/m LA Biplane Vol: 59.9 ml 35.10 ml/m  AORTIC VALVE AV Area (Vmax):    1.61 cm AV Area (Vmean):   1.57 cm AV Area (VTI):     1.56 cm AV Vmax:           132.00 cm/s AV Vmean:          85.500 cm/s AV VTI:            0.280 m AV Peak Grad:      7.0 mmHg AV Mean Grad:      4.0 mmHg LVOT Vmax:         93.80 cm/s LVOT Vmean:        59.100 cm/s LVOT VTI:          0.192 m LVOT/AV VTI ratio: 0.69 AI PHT:            422 msec  AORTA Ao Root diam: 3.10 cm MITRAL VALVE                TRICUSPID VALVE MV Area (PHT): 2.07 cm     TR Peak grad:   12.4 mmHg MV Decel Time: 366 msec     TR Vmax:  176.00 cm/s MV E velocity: 124.67 cm/s MV A velocity: 93.60 cm/s   SHUNTS MV E/A ratio:  1.33         Systemic VTI:  0.19 m                             Systemic Diam: 1.70 cm Buford Dresser MD Electronically signed by Buford Dresser MD Signature Date/Time: 07/07/2021/2:22:20 PM    Final       Discharge Exam: Vitals:   07/09/21 0009 07/09/21 0400  BP: (!) 112/44 (!) 104/49  Pulse: 60 64  Resp:    Temp: 98.4 F (36.9 C) 98.5 F (36.9 C)  SpO2: 98% 98%    General: Pt is alert, awake, not in acute distress, frail and chronically ill-appearing Cardiovascular: RRR, S1/S2 +, no edema Respiratory: CTA bilaterally, no wheezing, no rhonchi, no respiratory distress, no conversational dyspnea, on oxygen Abdominal: Soft, NT, ND, bowel sounds + Extremities: no edema, no  cyanosis Psych: Normal mood and affect, stable judgement and insight     The results of significant diagnostics from this hospitalization (including imaging, microbiology, ancillary and laboratory) are listed below for reference.     Microbiology: Recent Results (from the past 240 hour(s))  Resp Panel by RT-PCR (Flu A&B, Covid) Nasopharyngeal Swab     Status: None   Collection Time: 07/06/21 11:43 PM   Specimen: Nasopharyngeal Swab; Nasopharyngeal(NP) swabs in vial transport medium  Result Value Ref Range Status   SARS Coronavirus 2 by RT PCR NEGATIVE NEGATIVE Final    Comment: (NOTE) SARS-CoV-2 target nucleic acids are NOT DETECTED.  The SARS-CoV-2 RNA is generally detectable in upper respiratory specimens during the acute phase of infection. The lowest concentration of SARS-CoV-2 viral copies this assay can detect is 138 copies/mL. A negative result does not preclude SARS-Cov-2 infection and should not be used as the sole basis for treatment or other patient management decisions. A negative result may occur with  improper specimen collection/handling, submission of specimen other than nasopharyngeal swab, presence of viral mutation(s) within the areas targeted by this assay, and inadequate number of viral copies(<138 copies/mL). A negative result must be combined with clinical observations, patient history, and epidemiological information. The expected result is Negative.  Fact Sheet for Patients:  EntrepreneurPulse.com.au  Fact Sheet for Healthcare Providers:  IncredibleEmployment.be  This test is no t yet approved or cleared by the [place] FDA and  has been authorized for detection and/or diagnosis of SARS-CoV-2 by FDA under an Emergency Use Authorization (EUA). This EUA will remain  in effect (meaning this test can be used) for the duration of the COVID-19 declaration under Section 564(b)(1) of the Act, 21 U.S.C.section  360bbb-3(b)(1), unless the authorization is terminated  or revoked sooner.       Influenza A by PCR NEGATIVE NEGATIVE Final   Influenza B by PCR NEGATIVE NEGATIVE Final    Comment: (NOTE) The Xpert Xpress SARS-CoV-2/FLU/RSV plus assay is intended as an aid in the diagnosis of influenza from Nasopharyngeal swab specimens and should not be used as a sole basis for treatment. Nasal washings and aspirates are unacceptable for Xpert Xpress SARS-CoV-2/FLU/RSV testing.  Fact Sheet for Patients: EntrepreneurPulse.com.au  Fact Sheet for Healthcare Providers: IncredibleEmployment.be  This test is not yet approved or cleared by the [place] FDA and has been authorized for detection and/or diagnosis of SARS-CoV-2 by FDA under an Emergency Use Authorization (EUA). This EUA will remain in effect (meaning  this test can be used) for the duration of the COVID-19 declaration under Section 564(b)(1) of the Act, 21 U.S.C. section 360bbb-3(b)(1), unless the authorization is terminated or revoked.  Performed at [facility], [place] [address] [place], [place] [address]   Resp Panel by RT-PCR (Flu A&B, Covid) Nasopharyngeal Swab     Status: None   Collection Time: 07/08/21  2:30 PM   Specimen: Nasopharyngeal Swab; Nasopharyngeal(NP) swabs in vial transport medium  Result Value Ref Range Status   SARS Coronavirus 2 by RT PCR NEGATIVE NEGATIVE Final    Comment: (NOTE) SARS-CoV-2 target nucleic acids are NOT DETECTED.  The SARS-CoV-2 RNA is generally detectable in upper respiratory specimens during the acute phase of infection. The lowest concentration of SARS-CoV-2 viral copies this assay can detect is 138 copies/mL. A negative result does not preclude SARS-Cov-2 infection and should not be used as the sole basis for treatment or other patient management decisions. A negative result may occur with  improper specimen collection/handling, submission of  specimen other than nasopharyngeal swab, presence of viral mutation(s) within the areas targeted by this assay, and inadequate number of viral copies(<138 copies/mL). A negative result must be combined with clinical observations, patient history, and epidemiological information. The expected result is Negative.  Fact Sheet for Patients:  EntrepreneurPulse.com.au  Fact Sheet for Healthcare Providers:  IncredibleEmployment.be  This test is no t yet approved or cleared by the [place] FDA and  has been authorized for detection and/or diagnosis of SARS-CoV-2 by FDA under an Emergency Use Authorization (EUA). This EUA will remain  in effect (meaning this test can be used) for the duration of the COVID-19 declaration under Section 564(b)(1) of the Act, 21 U.S.C.section 360bbb-3(b)(1), unless the authorization is terminated  or revoked sooner.       Influenza A by PCR NEGATIVE NEGATIVE Final   Influenza B by PCR NEGATIVE NEGATIVE Final    Comment: (NOTE) The Xpert Xpress SARS-CoV-2/FLU/RSV plus assay is intended as an aid in the diagnosis of influenza from Nasopharyngeal swab specimens and should not be used as a sole basis for treatment. Nasal washings and aspirates are unacceptable for Xpert Xpress SARS-CoV-2/FLU/RSV testing.  Fact Sheet for Patients: EntrepreneurPulse.com.au  Fact Sheet for Healthcare Providers: IncredibleEmployment.be  This test is not yet approved or cleared by the [place] FDA and has been authorized for detection and/or diagnosis of SARS-CoV-2 by FDA under an Emergency Use Authorization (EUA). This EUA will remain in effect (meaning this test can be used) for the duration of the COVID-19 declaration under Section 564(b)(1) of the Act, 21 U.S.C. section 360bbb-3(b)(1), unless the authorization is terminated or revoked.  Performed at [facility], [place] [address]  [place], [place] [address]      Labs: BNP (last 3 results) Recent Labs    07/06/21 2344  BNP 637.8*   Basic Metabolic Panel: Recent Labs  Lab 07/06/21 2344 07/07/21 0050 07/07/21 0425 07/08/21 0357 07/09/21 0341  NA 133* 131* 133* 134* 134*  K 4.8 4.4 4.8 4.5 4.3  CL 103  --  103 100 97*  CO2 22  --  22 26 27   GLUCOSE 151*  --  118* 100* 98  BUN 35*  --  34* 40* 44*  CREATININE 2.17*  --  2.05* 2.71* 2.25*  CALCIUM 7.8*  --  7.8* 7.9* 7.9*  MG  --   --  1.9  --   --    Liver Function Tests: Recent Labs  Lab 07/06/21 2344 07/07/21 0425  AST 39 36  ALT 28 30  ALKPHOS 89 85  BILITOT 0.4 0.6  PROT 6.5 6.4*  ALBUMIN 2.3* 2.2*   No results for input(s): LIPASE, AMYLASE in the last 168 hours. No results for input(s): AMMONIA in the last 168 hours. CBC: Recent Labs  Lab 07/06/21 2344 07/07/21 0050 07/07/21 0425 07/08/21 0357  WBC 10.5  --  12.2* 10.3  NEUTROABS 8.3*  --  9.7*  --   HGB 10.3* 9.2* 11.0* 10.3*  HCT 32.6* 27.0* 34.1* 32.1*  MCV 100.6*  --  100.0 97.9  PLT 334  --  341 336   Cardiac Enzymes: No results for input(s): CKTOTAL, CKMB, CKMBINDEX, TROPONINI in the last 168 hours. BNP: Invalid input(s): POCBNP CBG: No results for input(s): GLUCAP in the last 168 hours. D-Dimer No results for input(s): DDIMER in the last 72 hours. Hgb A1c No results for input(s): HGBA1C in the last 72 hours. Lipid Profile No results for input(s): CHOL, HDL, LDLCALC, TRIG, CHOLHDL, LDLDIRECT in the last 72 hours. Thyroid function studies No results for input(s): TSH, T4TOTAL, T3FREE, THYROIDAB in the last 72 hours.  Invalid input(s): FREET3 Anemia work up No results for input(s): VITAMINB12, FOLATE, FERRITIN, TIBC, IRON, RETICCTPCT in the last 72 hours. Urinalysis    Component Value Date/Time   COLORURINE STRAW (A) 07/07/2021 0730   APPEARANCEUR CLEAR 07/07/2021 0730   LABSPEC 1.005 07/07/2021 0730   PHURINE 5.0 07/07/2021 0730   GLUCOSEU NEGATIVE 07/07/2021  0730   HGBUR NEGATIVE 07/07/2021 0730   BILIRUBINUR NEGATIVE 07/07/2021 0730   KETONESUR NEGATIVE 07/07/2021 0730   PROTEINUR NEGATIVE 07/07/2021 0730   UROBILINOGEN 0.2 01/24/2012 1315   NITRITE NEGATIVE 07/07/2021 0730   LEUKOCYTESUR NEGATIVE 07/07/2021 0730   Sepsis Labs Invalid input(s): PROCALCITONIN,  WBC,  LACTICIDVEN Microbiology Recent Results (from the past 240 hour(s))  Resp Panel by RT-PCR (Flu A&B, Covid) Nasopharyngeal Swab     Status: None   Collection Time: 07/06/21 11:43 PM   Specimen: Nasopharyngeal Swab; Nasopharyngeal(NP) swabs in vial transport medium  Result Value Ref Range Status   SARS Coronavirus 2 by RT PCR NEGATIVE NEGATIVE Final    Comment: (NOTE) SARS-CoV-2 target nucleic acids are NOT DETECTED.  The SARS-CoV-2 RNA is generally detectable in upper respiratory specimens during the acute phase of infection. The lowest concentration of SARS-CoV-2 viral copies this assay can detect is 138 copies/mL. A negative result does not preclude SARS-Cov-2 infection and should not be used as the sole basis for treatment or other patient management decisions. A negative result may occur with  improper specimen collection/handling, submission of specimen other than nasopharyngeal swab, presence of viral mutation(s) within the areas targeted by this assay, and inadequate number of viral copies(<138 copies/mL). A negative result must be combined with clinical observations, patient history, and epidemiological information. The expected result is Negative.  Fact Sheet for Patients:  EntrepreneurPulse.com.au  Fact Sheet for Healthcare Providers:  IncredibleEmployment.be  This test is no t yet approved or cleared by the [place] FDA and  has been authorized for detection and/or diagnosis of SARS-CoV-2 by FDA under an Emergency Use Authorization (EUA). This EUA will remain  in effect (meaning this test can be used) for the  duration of the COVID-19 declaration under Section 564(b)(1) of the Act, 21 U.S.C.section 360bbb-3(b)(1), unless the authorization is terminated  or revoked sooner.       Influenza A by PCR NEGATIVE NEGATIVE Final   Influenza B by PCR  NEGATIVE NEGATIVE Final    Comment: (NOTE) The Xpert Xpress SARS-CoV-2/FLU/RSV plus assay is intended as an aid in the diagnosis of influenza from Nasopharyngeal swab specimens and should not be used as a sole basis for treatment. Nasal washings and aspirates are unacceptable for Xpert Xpress SARS-CoV-2/FLU/RSV testing.  Fact Sheet for Patients: EntrepreneurPulse.com.au  Fact Sheet for Healthcare Providers: IncredibleEmployment.be  This test is not yet approved or cleared by the [place] FDA and has been authorized for detection and/or diagnosis of SARS-CoV-2 by FDA under an Emergency Use Authorization (EUA). This EUA will remain in effect (meaning this test can be used) for the duration of the COVID-19 declaration under Section 564(b)(1) of the Act, 21 U.S.C. section 360bbb-3(b)(1), unless the authorization is terminated or revoked.  Performed at [facility], [place] [address], [place], [place] [address]   Resp Panel by RT-PCR (Flu A&B, Covid) Nasopharyngeal Swab     Status: None   Collection Time: 07/08/21  2:30 PM   Specimen: Nasopharyngeal Swab; Nasopharyngeal(NP) swabs in vial transport medium  Result Value Ref Range Status   SARS Coronavirus 2 by RT PCR NEGATIVE NEGATIVE Final    Comment: (NOTE) SARS-CoV-2 target nucleic acids are NOT DETECTED.  The SARS-CoV-2 RNA is generally detectable in upper respiratory specimens during the acute phase of infection. The lowest concentration of SARS-CoV-2 viral copies this assay can detect is 138 copies/mL. A negative result does not preclude SARS-Cov-2 infection and should not be used as the sole basis for treatment or other patient management  decisions. A negative result may occur with  improper specimen collection/handling, submission of specimen other than nasopharyngeal swab, presence of viral mutation(s) within the areas targeted by this assay, and inadequate number of viral copies(<138 copies/mL). A negative result must be combined with clinical observations, patient history, and epidemiological information. The expected result is Negative.  Fact Sheet for Patients:  EntrepreneurPulse.com.au  Fact Sheet for Healthcare Providers:  IncredibleEmployment.be  This test is no t yet approved or cleared by the [place] FDA and  has been authorized for detection and/or diagnosis of SARS-CoV-2 by FDA under an Emergency Use Authorization (EUA). This EUA will remain  in effect (meaning this test can be used) for the duration of the COVID-19 declaration under Section 564(b)(1) of the Act, 21 U.S.C.section 360bbb-3(b)(1), unless the authorization is terminated  or revoked sooner.       Influenza A by PCR NEGATIVE NEGATIVE Final   Influenza B by PCR NEGATIVE NEGATIVE Final    Comment: (NOTE) The Xpert Xpress SARS-CoV-2/FLU/RSV plus assay is intended as an aid in the diagnosis of influenza from Nasopharyngeal swab specimens and should not be used as a sole basis for treatment. Nasal washings and aspirates are unacceptable for Xpert Xpress SARS-CoV-2/FLU/RSV testing.  Fact Sheet for Patients: EntrepreneurPulse.com.au  Fact Sheet for Healthcare Providers: IncredibleEmployment.be  This test is not yet approved or cleared by the [place] FDA and has been authorized for detection and/or diagnosis of SARS-CoV-2 by FDA under an Emergency Use Authorization (EUA). This EUA will remain in effect (meaning this test can be used) for the duration of the COVID-19 declaration under Section 564(b)(1) of the Act, 21 U.S.C. section 360bbb-3(b)(1), unless the  authorization is terminated or revoked.  Performed at [facility], [place] [address] [place], [place] [address]      Patient was seen and examined on the day of discharge and was found to be in stable condition. Time coordinating discharge: 25 minutes  including assessment and coordination of care, as well as examination of the patient.   SIGNED:  Dessa Phi, DO [facility] 07/09/2021, 10:16 AM

## 2021-07-09 NOTE — Progress Notes (Signed)
Assumed care of patient.

## 2021-07-11 ENCOUNTER — Non-Acute Institutional Stay (SKILLED_NURSING_FACILITY): Payer: Medicare HMO | Admitting: Adult Health

## 2021-07-11 ENCOUNTER — Encounter: Payer: Self-pay | Admitting: Adult Health

## 2021-07-11 DIAGNOSIS — J9601 Acute respiratory failure with hypoxia: Secondary | ICD-10-CM

## 2021-07-11 DIAGNOSIS — I10 Essential (primary) hypertension: Secondary | ICD-10-CM

## 2021-07-11 DIAGNOSIS — R2681 Unsteadiness on feet: Secondary | ICD-10-CM | POA: Diagnosis not present

## 2021-07-11 DIAGNOSIS — E782 Mixed hyperlipidemia: Secondary | ICD-10-CM | POA: Diagnosis not present

## 2021-07-11 DIAGNOSIS — M6389 Disorders of muscle in diseases classified elsewhere, multiple sites: Secondary | ICD-10-CM | POA: Diagnosis not present

## 2021-07-11 DIAGNOSIS — I5023 Acute on chronic systolic (congestive) heart failure: Secondary | ICD-10-CM

## 2021-07-11 DIAGNOSIS — I4892 Unspecified atrial flutter: Secondary | ICD-10-CM

## 2021-07-11 DIAGNOSIS — R0602 Shortness of breath: Secondary | ICD-10-CM | POA: Diagnosis not present

## 2021-07-11 DIAGNOSIS — I5022 Chronic systolic (congestive) heart failure: Secondary | ICD-10-CM | POA: Diagnosis not present

## 2021-07-11 DIAGNOSIS — R5383 Other fatigue: Secondary | ICD-10-CM | POA: Diagnosis not present

## 2021-07-11 DIAGNOSIS — R278 Other lack of coordination: Secondary | ICD-10-CM | POA: Diagnosis not present

## 2021-07-11 DIAGNOSIS — N184 Chronic kidney disease, stage 4 (severe): Secondary | ICD-10-CM

## 2021-07-11 DIAGNOSIS — R2689 Other abnormalities of gait and mobility: Secondary | ICD-10-CM | POA: Diagnosis not present

## 2021-07-11 DIAGNOSIS — E039 Hypothyroidism, unspecified: Secondary | ICD-10-CM | POA: Diagnosis not present

## 2021-07-11 NOTE — Progress Notes (Signed)
Location:  Occupational psychologist of Service:  SNF (31) Provider:   Cindi Carbon, [place] [phone]   [name], MD  Patient Care Team: [name], MD as PCP - General (Internal Medicine) Gayland Curry, DO as PCP - Internal Medicine (Geriatric Medicine) Haroldine Laws, Shaune Pascal, MD as PCP - Advanced Heart Failure (Cardiology) Royal Hawthorn, NP as Nurse Practitioner (Nurse Practitioner)  Extended Emergency Contact Information Primary Emergency Contact: [name],[name] Address: [address]          [place] [address] [name] of [place] Phone: [phone] Mobile Phone: [phone] Relation: Brother  Code Status:  DNR Goals of care: Advanced Directive information Advanced Directives 07/06/2021  Does Patient Have a Medical Advance Directive? Yes  Type of Advance Directive Out of facility DNR (pink MOST or yellow form)  Does patient want to make changes to medical advance directive? Yes (ED - Information included in AVS)  Copy of Healthcare Power of Attorney in Chart? -  Would patient like information on creating a medical advance directive? -  Pre-existing out of facility DNR order (yellow form or pink MOST form) Pink Most/Yellow Form available - Physician notified to receive inpatient order     Chief Complaint  Patient presents with   Hospitalization Follow-up    HPI:  Pt is a 85 y.o. male  with PMH chronic kidney disease stage IV, systolic congestive heart failure (Echo 07/2019 EF as low as 15-20%, last echo 07/07/21 30-35%), atrial flutter (S/P DCCV 12/2010), hypertension, hypothyroidism, left bundle branch block and chronic kidney disease stage IV, cognitive delay, seen today for a hospital f/u s/p admission from 07/06/21-07/09/21 for sob, found to be in systolic CHF with BNP of 361 and CXR with evidence of bilateral patchy infiltrates on chest x-ray concerning for pulmonary edema. He was given IV lasix and seen by  cardiology.  His BUN and CR worsened with diuresis and lasix and ARB were held. Discharged on Lasix 40 mg daily and aldactone 25 mg, ARB on hold. He denies any sob today. The staff note that he is comfortable at rest but has some sob with exertion. He was discharged on 2 liters of oxygen, fluid restriction, daily weights. His weight is up 3 lbs but unfortunately it was taken after his breakfast.    Wt Readings from Last 3 Encounters:  07/11/21 132 lb 12.8 oz (60.2 kg)  07/09/21 129 lb 13.6 oz (58.9 kg)  04/08/21 138 lb (62.6 kg)   He resides in [place] with his wife and they both of cognitive delays. His goal is to receive therapy here at [facility] in the rehab section and return to [place].   He is walking with a walker. No acute complaints.   Past Medical History:  Diagnosis Date   Arthritis    Atrial fibrillation (HCC)    Atrial flutter (HCC)    Cardiomyopathy    Nonischemic, LVEF 20%   CHF (congestive heart failure) (HCC)    Chronic renal insufficiency    COPD (chronic obstructive pulmonary disease) (HCC)    Fracture of unspecified part of left clavicle, initial encounter for closed fracture    Hypothyroidism    Left ankle sprain    Left bundle branch block    Mixed hyperlipidemia    Renal failure    Scoliosis    Past Surgical History:  Procedure Laterality Date   EYE SURGERY     LAPAROSCOPIC CHOLECYSTECTOMY  2008   left foot surgery  1998   MASS EXCISION Left 06/26/2016   Procedure: EXCISION Mucoid tumor;  Surgeon: Daryll Brod, MD;  Location: [facility];  Service: Orthopedics;  Laterality: Left;  FAB   Right inguinal herniorrhaphy     ROTATION FLAP Left 06/26/2016   Procedure: debridement of distal interphalangeal possible, ROTATION FLAP left index finger;  Surgeon: Daryll Brod, MD;  Location: [facility];  Service: Orthopedics;  Laterality: Left;   SKIN CANCER REMOVED  LEFT EAR     TONSILLECTOMY      No Known Allergies  Outpatient Encounter Medications as of 07/11/2021  Medication Sig    amiodarone (PACERONE) 200 MG tablet TAKE 1 TABLET BY MOUTH ONCE DAILY (Patient taking differently: Take 200 mg by mouth daily.)   ammonium lactate (AMLACTIN) 12 % cream Apply 1 application topically as needed for dry skin.   apixaban (ELIQUIS) 2.5 MG TABS tablet Take 1 tablet (2.5 mg total) by mouth 2 (two) times daily. Please cancel all previous orders for current medication. Change in dosage or pill size.   aspirin EC 81 MG tablet Take 81 mg by mouth daily.   furosemide (LASIX) 40 MG tablet Take 40 mg by mouth daily.   hydrALAZINE (APRESOLINE) 25 MG tablet TAKE 1 TABLET BY MOUTH THREE TIMES DAILY (Patient taking differently: Take 25 mg by mouth 3 (three) times daily.)   hydrocortisone ointment 0.5 % Apply 1 application topically 2 (two) times daily as needed for itching. Right thumb   hydrocortisone valerate cream (WESTCORT) 0.2 % Apply 1 application topically 2 (two) times daily. To affected areas of ear, scalp, hair, and nostrils/nose   isosorbide mononitrate (IMDUR) 30 MG 24 hr tablet TAKE 1 TABLET BY MOUTH ONCE DAILY IN THE MORNING (Patient taking differently: Take 30 mg by mouth daily.)   ketoconazole (NIZORAL) 2 % cream Apply 1 application topically 2 (two) times daily. To affected areas of ear, scalp, hair, and nostrils/nose   ketoconazole (NIZORAL) 2 % shampoo Apply 1 application topically 2 (two) times a week. With hair washing on Wednesday's and Saturday's   levothyroxine (SYNTHROID) 150 MCG tablet Take 150 mcg by mouth daily before breakfast.   loratadine (CLARITIN) 10 MG tablet Take 10 mg by mouth daily.   polyethylene glycol (MIRALAX / GLYCOLAX) 17 g packet Take 17 g by mouth daily as needed for mild constipation.   spironolactone (ALDACTONE) 25 MG tablet TAKE ONE TABLET BY MOUTH ONCE DAILY (Patient taking differently: Take 25 mg by mouth once.)   No facility-administered encounter medications on file as of 07/11/2021.    Review of Systems  Constitutional:  Positive for activity  change. Negative for appetite change, chills, diaphoresis, fatigue, fever and unexpected weight change.  HENT:  Negative for trouble swallowing.   Respiratory:  Negative for cough, shortness of breath (on exertion), wheezing and stridor.   Cardiovascular:  Positive for leg swelling. Negative for chest pain and palpitations.  Gastrointestinal:  Negative for abdominal distention, abdominal pain, constipation and diarrhea.  Genitourinary:  Negative for decreased urine volume, difficulty urinating and dysuria.  Musculoskeletal:  Positive for gait problem. Negative for arthralgias, back pain, joint swelling and myalgias.  Neurological:  Negative for dizziness, seizures, syncope, facial asymmetry, speech difficulty, weakness and headaches.  Hematological:  Negative for adenopathy. Does not bruise/bleed easily.  Psychiatric/Behavioral:  Negative for agitation, behavioral problems and confusion.    Immunization History  Administered Date(s) Administered   Fluad Quad(high Dose 65+) 06/19/2021   Influenza Inj Mdck Quad Pf 07/04/2016   Influenza Inj Mdck  Quad With Preservative 06/18/2017   Influenza Split 06/02/2014, 06/02/2014, 06/14/2015   Influenza, High Dose Seasonal PF 07/14/2019, 07/06/2020   Influenza,inj,Quad PF,6+ Mos 07/15/2018   Moderna SARS-COV2 Booster Vaccination 06/26/2021   Moderna Sars-Covid-2 Vaccination 09/25/2019, 10/25/2019, 07/28/2020   Pneumococcal Conjugate-13 02/07/2016   Pneumococcal Polysaccharide-23 03/09/2018   Tdap 03/06/2018   Zoster Recombinat (Shingrix) 03/26/2018, 03/26/2021   Pertinent  Health Maintenance Due  Topic Date Due   INFLUENZA VACCINE  Completed   Fall Risk 07/07/2021 07/07/2021 07/08/2021 07/08/2021 07/09/2021  Falls in the past year? - - - - -  Was there an injury with Fall? - - - - -  Was there an injury with Fall? - - - - -  Fall Risk Category Calculator - - - - -  Fall Risk Category - - - - -  Patient Fall Risk Level Moderate fall risk  Moderate fall risk Moderate fall risk Moderate fall risk Moderate fall risk  Patient at Risk for Falls Due to - - - - -  Fall risk Follow up - - - - -   Functional Status Survey:    Vitals:   07/11/21 1603  BP: (!) 104/51  Pulse: 64  Resp: 20  Temp: 98 F (36.7 C)  SpO2: (!) 20%  Weight: 132 lb 12.8 oz (60.2 kg)   Body mass index is 22.8 kg/m. Physical Exam Constitutional:      General: He is not in acute distress.    Appearance: He is not diaphoretic.  HENT:     Head: Normocephalic and atraumatic.     Nose: Nose normal. No congestion.     Mouth/Throat:     Mouth: Mucous membranes are moist.     Pharynx: Oropharynx is clear. No oropharyngeal exudate.  Eyes:     Conjunctiva/sclera: Conjunctivae normal.     Pupils: Pupils are equal, round, and reactive to light.  Neck:     Thyroid: No thyromegaly.     Vascular: No JVD.     Trachea: No tracheal deviation.  Cardiovascular:     Rate and Rhythm: Normal rate and regular rhythm.     Heart sounds: No murmur heard. Pulmonary:     Effort: Pulmonary effort is normal. No respiratory distress.     Breath sounds: No wheezing or rales.     Comments: Decreased bases  Abdominal:     General: Bowel sounds are normal. There is no distension.     Palpations: Abdomen is soft.     Tenderness: There is no abdominal tenderness.  Musculoskeletal:     Right lower leg: Edema (trace) present.     Left lower leg: Edema (trace) present.     Comments: Kyphotic   Lymphadenopathy:     Cervical: No cervical adenopathy.  Skin:    General: Skin is warm and dry.  Neurological:     Mental Status: He is alert and oriented to person, place, and time.     Cranial Nerves: No cranial nerve deficit.  Psychiatric:        Mood and Affect: Mood normal.    Labs reviewed: Recent Labs    07/07/21 0425 07/08/21 0357 07/09/21 0341  NA 133* 134* 134*  K 4.8 4.5 4.3  CL 103 100 97*  CO2 22 26 27   GLUCOSE 118* 100* 98  BUN 34* 40* 44*  CREATININE  2.05* 2.71* 2.25*  CALCIUM 7.8* 7.9* 7.9*  MG 1.9  --   --    Recent Labs    01/03/21  0000 07/06/21 2344 07/07/21 0425  AST 22 39 36  ALT 15 28 30   ALKPHOS 83 89 85  BILITOT  --  0.4 0.6  PROT  --  6.5 6.4*  ALBUMIN 4.0 2.3* 2.2*   Recent Labs    07/06/21 2344 07/07/21 0050 07/07/21 0425 07/08/21 0357  WBC 10.5  --  12.2* 10.3  NEUTROABS 8.3*  --  9.7*  --   HGB 10.3* 9.2* 11.0* 10.3*  HCT 32.6* 27.0* 34.1* 32.1*  MCV 100.6*  --  100.0 97.9  PLT 334  --  341 336   Lab Results  Component Value Date   TSH 1.29 02/28/2021   Lab Results  Component Value Date   HGBA1C 5.5 10/18/2017   Lab Results  Component Value Date   CHOL 171 01/03/2021   HDL 59 01/03/2021   LDLCALC 92 01/03/2021   TRIG 102 01/03/2021   CHOLHDL 3.4 10/18/2017    Significant Diagnostic Results in last 30 days:  DG Chest Port 1 View  Result Date: 07/07/2021 CLINICAL DATA:  Weakness and shortness of breath. EXAM: PORTABLE CHEST 1 VIEW COMPARISON:  October 22, 2017 FINDINGS: The lungs are hyperinflated. Diffuse, chronic appearing increased interstitial lung markings are seen. Mild areas of atelectasis and/or infiltrate are seen involving the mid left lung, bilateral lung bases and right upper lobe. There is no evidence of a pleural effusion or pneumothorax. Evaluation of the superior mediastinum is limited secondary to patient positioning. The cardiac silhouette is moderately enlarged. Radiopaque surgical clips are seen within the left upper quadrant. Degenerative changes seen throughout the thoracic spine. IMPRESSION: Chronic appearing increased interstitial lung markings with mild areas of bilateral atelectasis and/or infiltrate. Electronically Signed   By: Virgina Norfolk M.D.   On: 07/07/2021 00:11   ECHOCARDIOGRAM COMPLETE  Result Date: 07/07/2021    ECHOCARDIOGRAM REPORT   Patient Name:   [name] Date of Exam: 07/07/2021 Medical Rec #:  [id number]        Height:       64.0 in Accession  #:    [id number]       Weight:       145.0 lb Date of Birth:  [birth year]       BSA:          1.706 m Patient Age:    31 years         BP:           107/55 mmHg Patient Gender: M                HR:           72 bpm. Exam Location:  Inpatient Procedure: 2D Echo, Cardiac Doppler and Color Doppler Indications:    CHF  History:        Patient has prior history of Echocardiogram examinations, most                 recent 07/28/2019. Arrythmias:LBBB; Risk Factors:Dyslipidemia.  Sonographer:    Maudry Mayhew Gardners, RVT, RDCS Referring Phys: [id number] [facility]  Sonographer Comments: No subcostal window. IMPRESSIONS  1. Left ventricular ejection fraction, by estimation, is 30 to 35%. The left ventricle has moderately decreased function. The left ventricle demonstrates regional wall motion abnormalities (see scoring diagram/findings for description). The left ventricular internal cavity size was mildly dilated. Left ventricular diastolic parameters are consistent with Grade II diastolic dysfunction (pseudonormalization). Elevated left ventricular end-diastolic pressure.  2. Right ventricular systolic function is mildly reduced.  The right ventricular size is normal.  3. Left atrial size was moderately dilated.  4. Right atrial size was mild to moderately dilated.  5. The mitral valve is normal in structure. Mild mitral valve regurgitation. No evidence of mitral stenosis.  6. The aortic valve was not well visualized. Aortic valve regurgitation is mild to moderate. Comparison(s): Changes from prior study are noted. Conclusion(s)/Recommendation(s): EF reduced compared to prior. Global hypokinesis with akinesis/dyskinesis of the entire septum, out of proportion to dyssychrony from LBBB. FINDINGS  Left Ventricle: Left ventricular ejection fraction, by estimation, is 30 to 35%. The left ventricle has moderately decreased function. The left ventricle demonstrates regional wall motion abnormalities. The left ventricular  internal cavity size was mildly dilated. There is no left ventricular hypertrophy. Abnormal (paradoxical) septal motion, consistent with left bundle branch block. Left ventricular diastolic parameters are consistent with Grade II diastolic dysfunction (pseudonormalization). Elevated left ventricular end-diastolic pressure.  LV Wall Scoring: The anterior septum is dyskinetic. The inferior septum is akinetic. The entire anterior wall, entire lateral wall, entire inferior wall, and apex are hypokinetic. Right Ventricle: The right ventricular size is normal. Right vetricular wall thickness was not well visualized. Right ventricular systolic function is mildly reduced. Left Atrium: Left atrial size was moderately dilated. Right Atrium: Right atrial size was mild to moderately dilated. Pericardium: There is no evidence of pericardial effusion. Mitral Valve: The mitral valve is normal in structure. Mild mitral valve regurgitation. No evidence of mitral valve stenosis. Tricuspid Valve: The tricuspid valve is normal in structure. Tricuspid valve regurgitation is trivial. No evidence of tricuspid stenosis. Aortic Valve: The aortic valve was not well visualized. Aortic valve regurgitation is mild to moderate. Aortic regurgitation PHT measures 422 msec. Aortic valve mean gradient measures 4.0 mmHg. Aortic valve peak gradient measures 7.0 mmHg. Aortic valve area, by VTI measures 1.56 cm. Pulmonic Valve: The pulmonic valve was not well visualized. Pulmonic valve regurgitation is not visualized. Aorta: The aortic root is normal in size and structure, the aortic arch was not well visualized and the ascending aorta was not well visualized. IAS/Shunts: The atrial septum is grossly normal.  LEFT VENTRICLE PLAX 2D LVIDd:         5.75 cm   Diastology LVIDs:         4.90 cm   LV e' medial:    4.12 cm/s LV PW:         0.70 cm   LV E/e' medial:  30.3 LV IVS:        0.60 cm   LV e' lateral:   8.93 cm/s LVOT diam:     1.70 cm   LV E/e'  lateral: 14.0 LV SV:         44 LV SV Index:   26 LVOT Area:     2.27 cm  RIGHT VENTRICLE TAPSE (M-mode): 1.5 cm LEFT ATRIUM             Index        RIGHT ATRIUM           Index LA diam:        3.50 cm 2.05 cm/m   RA Area:     17.20 cm LA Vol (A2C):   48.9 ml 28.66 ml/m  RA Volume:   48.10 ml  28.19 ml/m LA Vol (A4C):   73.1 ml 42.84 ml/m LA Biplane Vol: 59.9 ml 35.10 ml/m  AORTIC VALVE AV Area (Vmax):    1.61 cm AV Area (Vmean):   1.57  cm AV Area (VTI):     1.56 cm AV Vmax:           132.00 cm/s AV Vmean:          85.500 cm/s AV VTI:            0.280 m AV Peak Grad:      7.0 mmHg AV Mean Grad:      4.0 mmHg LVOT Vmax:         93.80 cm/s LVOT Vmean:        59.100 cm/s LVOT VTI:          0.192 m LVOT/AV VTI ratio: 0.69 AI PHT:            422 msec  AORTA Ao Root diam: 3.10 cm MITRAL VALVE                TRICUSPID VALVE MV Area (PHT): 2.07 cm     TR Peak grad:   12.4 mmHg MV Decel Time: 366 msec     TR Vmax:        176.00 cm/s MV E velocity: 124.67 cm/s MV A velocity: 93.60 cm/s   SHUNTS MV E/A ratio:  1.33         Systemic VTI:  0.19 m                             Systemic Diam: 1.70 cm Buford Dresser MD Electronically signed by Buford Dresser MD Signature Date/Time: 07/07/2021/2:22:20 PM    Final     Assessment/Plan  1. Acute on chronic systolic CHF (congestive heart failure) (HCC) Improving Continue lasix 40 mg and ALdactone 25 mg Continue to monitor weights Low sodium diet FR 1500 cc per day F/U with CHF clinic  If weight trending up may need more diuresis  He has declined ICD in the past. In the future he may find palliative care to be beneficial due to his CHF, CKD, and cognitive delays (wife may need support as well)  On asa and plavix   2. Acute respiratory failure with hypoxia (HCC) Due to CHF and kyphosis Titrate for sats >90%  3. Acquired hypothyroidism Lab Results  Component Value Date   TSH 1.29 02/28/2021  Continue Synthroid 150 mcg qd    4. Essential  hypertension Runs low on hydralazine, lasix, aldactone ARB on hold due to CKD   5. Paroxysmal atrial flutter (HCC) Rate is controlled and regular on exam  Currently on amiodarone   6. CKD (chronic kidney disease) stage 4, GFR 15-29 ml/min (HCC) BUN 44 Cr 2.25 on discharge Will repeat   7. Mixed hyperlipidemia LDL 92 01/03/21 He is not on a statin likely due to age and goals of care    Family/ staff Communication: resident and nurse   Labs/tests ordered:  CBC BMP next week

## 2021-07-12 DIAGNOSIS — M6389 Disorders of muscle in diseases classified elsewhere, multiple sites: Secondary | ICD-10-CM | POA: Diagnosis not present

## 2021-07-12 DIAGNOSIS — R2681 Unsteadiness on feet: Secondary | ICD-10-CM | POA: Diagnosis not present

## 2021-07-12 DIAGNOSIS — R0602 Shortness of breath: Secondary | ICD-10-CM | POA: Diagnosis not present

## 2021-07-12 DIAGNOSIS — I5022 Chronic systolic (congestive) heart failure: Secondary | ICD-10-CM | POA: Diagnosis not present

## 2021-07-12 DIAGNOSIS — I5023 Acute on chronic systolic (congestive) heart failure: Secondary | ICD-10-CM | POA: Diagnosis not present

## 2021-07-12 DIAGNOSIS — R5383 Other fatigue: Secondary | ICD-10-CM | POA: Diagnosis not present

## 2021-07-12 DIAGNOSIS — R278 Other lack of coordination: Secondary | ICD-10-CM | POA: Diagnosis not present

## 2021-07-12 DIAGNOSIS — R2689 Other abnormalities of gait and mobility: Secondary | ICD-10-CM | POA: Diagnosis not present

## 2021-07-12 DIAGNOSIS — N184 Chronic kidney disease, stage 4 (severe): Secondary | ICD-10-CM | POA: Diagnosis not present

## 2021-07-15 ENCOUNTER — Encounter: Payer: Self-pay | Admitting: Internal Medicine

## 2021-07-15 ENCOUNTER — Non-Acute Institutional Stay (SKILLED_NURSING_FACILITY): Payer: Medicare HMO | Admitting: Internal Medicine

## 2021-07-15 DIAGNOSIS — R5383 Other fatigue: Secondary | ICD-10-CM | POA: Diagnosis not present

## 2021-07-15 DIAGNOSIS — R2681 Unsteadiness on feet: Secondary | ICD-10-CM | POA: Diagnosis not present

## 2021-07-15 DIAGNOSIS — E039 Hypothyroidism, unspecified: Secondary | ICD-10-CM | POA: Diagnosis not present

## 2021-07-15 DIAGNOSIS — I4892 Unspecified atrial flutter: Secondary | ICD-10-CM

## 2021-07-15 DIAGNOSIS — R0602 Shortness of breath: Secondary | ICD-10-CM | POA: Diagnosis not present

## 2021-07-15 DIAGNOSIS — D631 Anemia in chronic kidney disease: Secondary | ICD-10-CM | POA: Diagnosis not present

## 2021-07-15 DIAGNOSIS — R2689 Other abnormalities of gait and mobility: Secondary | ICD-10-CM | POA: Diagnosis not present

## 2021-07-15 DIAGNOSIS — M6389 Disorders of muscle in diseases classified elsewhere, multiple sites: Secondary | ICD-10-CM | POA: Diagnosis not present

## 2021-07-15 DIAGNOSIS — N184 Chronic kidney disease, stage 4 (severe): Secondary | ICD-10-CM | POA: Diagnosis not present

## 2021-07-15 DIAGNOSIS — I5023 Acute on chronic systolic (congestive) heart failure: Secondary | ICD-10-CM

## 2021-07-15 DIAGNOSIS — I10 Essential (primary) hypertension: Secondary | ICD-10-CM | POA: Diagnosis not present

## 2021-07-15 DIAGNOSIS — I5022 Chronic systolic (congestive) heart failure: Secondary | ICD-10-CM | POA: Diagnosis not present

## 2021-07-15 DIAGNOSIS — N390 Urinary tract infection, site not specified: Secondary | ICD-10-CM | POA: Diagnosis not present

## 2021-07-15 DIAGNOSIS — R278 Other lack of coordination: Secondary | ICD-10-CM | POA: Diagnosis not present

## 2021-07-15 LAB — BASIC METABOLIC PANEL
BUN: 43 — AB (ref 4–21)
CO2: 25 — AB (ref 13–22)
Chloride: 103 (ref 99–108)
Creatinine: 2.2 — AB (ref 0.6–1.3)
Glucose: 101
Potassium: 4.8 (ref 3.4–5.3)
Sodium: 140 (ref 137–147)

## 2021-07-15 LAB — CBC AND DIFFERENTIAL
HCT: 33 — AB (ref 41–53)
Hemoglobin: 10.8 — AB (ref 13.5–17.5)
Platelets: 388 (ref 150–399)
WBC: 9.4

## 2021-07-15 LAB — COMPREHENSIVE METABOLIC PANEL WITH GFR: Calcium: 8.3 — AB (ref 8.7–10.7)

## 2021-07-15 LAB — CBC: RBC: 3.43 — AB (ref 3.87–5.11)

## 2021-07-15 NOTE — Progress Notes (Signed)
Provider:  Veleta Miners MD Location:   [facility] Room Number: 086 Place of Service:  SNF ([id number])  PCP: Joshua Dad, MD Patient Care Team: Joshua Dad, MD as PCP - General (Internal Medicine) Joshua Curry, DO as PCP - Internal Medicine (Geriatric Medicine) Bensimhon, Shaune Pascal, MD as PCP - Advanced Heart Failure (Cardiology) Joshua Hawthorn, NP as Nurse Practitioner (Nurse Practitioner)  Extended Emergency Contact Information Primary Emergency Contact: [name] Address: [address]          [place] [address] [name] Phone: [phone] Mobile Phone: [phone] Relation: Brother  Code Status: DNR  Goals of Care: Advanced Directive information Advanced Directives 07/15/2021  Does Patient Have a Medical Advance Directive? Yes  Type of Advance Directive Out of facility DNR (pink MOST or yellow form)  Does patient want to make changes to medical advance directive? No - Patient declined  Copy of [place] in Chart? -  Would patient like information on creating a medical advance directive? -  Pre-existing out of facility DNR order (yellow form or pink MOST form) Pink Most/Yellow Form available - Physician notified to receive inpatient order      Chief Complaint  Patient presents with   New Admit To SNF    Admission to SNF    HPI: Patient is a 85 y.o. male seen today for admission to SNF for therapy  Admitted to the hospital from 10/22-10/25 for Acute over Chronic CHF  Patient lives in [place] with his wife He has a history of chronic systolic heart failure last EF was 45% which is much improved.  Follows with Dr. Haroldine Bowen Also has a history of bradycardia, Worsened on the Coreg. History of atrial flutter on amiodarone and Eliquis History of hypothyroidism Also history of CKD stage IV Speech impairment Poor Vision Skin Cancer on Scalp s/p Mohrs Surgery  Patient  noticed increased shortness of  breath with worsening over few days with cough and generalized weakness.  In ED was found to have bilateral patchy infiltrate and elevated BNP of 901.  Was admitted for CHF Repeat echo showed a EF of 30 to 35% with septal akinesis which is lower than his previous EF.  Troponin mildly elevated. Cath  Not considered due to renal insufficiency and no chest pain His ARB was stopped due to worsening creatinine Patient was discharged on 2 L of oxygen  Today patient feels much better has already been weaned off from the oxygen.  Per therapy does have some shortness of breath on exertion but pulse ox has stayed above 90.  His weight is up to 134 pounds but patient denies any worsening cough.  No chest pain.  Does have some edema in his legs  Past Medical History:  Diagnosis Date   Arthritis    Atrial fibrillation (HCC)    Atrial flutter (HCC)    Cardiomyopathy    Nonischemic, LVEF 20%   CHF (congestive heart failure) (HCC)    Chronic renal insufficiency    COPD (chronic obstructive pulmonary disease) ([facility])    Fracture of unspecified part of left clavicle, initial encounter for closed fracture    Hypothyroidism    Left ankle sprain    Left bundle branch block    Mixed hyperlipidemia    Renal failure    Scoliosis    Past Surgical History:  Procedure Laterality Date   EYE SURGERY     LAPAROSCOPIC CHOLECYSTECTOMY  2008   left foot surgery  1998   MASS EXCISION Left 06/26/2016   Procedure: EXCISION Mucoid tumor;  Surgeon: Daryll Brod, MD;  Location: [facility];  Service: Orthopedics;  Laterality: Left;  FAB   Right inguinal herniorrhaphy     ROTATION FLAP Left 06/26/2016   Procedure: debridement of distal interphalangeal possible, ROTATION FLAP left index finger;  Surgeon: Daryll Brod, MD;  Location: [facility];  Service: Orthopedics;  Laterality: Left;   SKIN CANCER REMOVED  LEFT EAR     TONSILLECTOMY      reports that he has never smoked. He has never used smokeless tobacco. He reports that he does  not currently use alcohol. He reports that he does not use drugs. Social History   Socioeconomic History   Marital status: Married    Spouse name: Not on file   Number of children: Not on file   Years of education: Not on file   Highest education level: Not on file  Occupational History   Occupation: Retired    Comment: Previously worked at [employer] Use   Smoking status: Never   Smokeless tobacco: Never  Scientific laboratory technician Use: Never used  Substance and Sexual Activity   Alcohol use: Not Currently   Drug use: No   Sexual activity: Not on file  Other Topics Concern   Not on file  Social History Narrative   Tobacco use, amount per day now: No      Past tobacco use, amount per day: No      How many years did you use tobacco: No      Alcohol use (drinks per week): No      Diet: Low Salt       Do you drink/eat things with caffeine? Tea      Marital status: Married             What year were you married? 1975      Do you live in a house, apartment, assisted living, condo, trailer?      Is it one or more stories?      How many persons live in your home?      Do you have any pets in your home?      Current or past profession?      Do you exercise?             How often?      Do you have a living will? Yes      Do you have a DNR form? Yes           If not, do you want to discuss one?      Do you have signed POA/HPOA forms? Yes              Social Determinants of Health   Financial Resource Strain: Not on file  Food Insecurity: Not on file  Transportation Needs: Not on file  Physical Activity: Not on file  Stress: Not on file  Social Connections: Not on file  Intimate Partner Violence: Not on file    Functional Status Survey:    Family History  Problem Relation Age of Onset   Heart disease Father        Died age 32   Stroke Mother        Died age 69   Diabetes Mother    Pancreatic cancer Brother    Arthritis Brother    Throat cancer  Brother    Arthritis Brother  Diabetes Sister    Seizures Sister     Health Maintenance  Topic Date Due   COVID-19 Vaccine (4 - Booster for Moderna series) 08/21/2021   TETANUS/TDAP  03/06/2028   Pneumonia Vaccine 53+ Years old  Completed   INFLUENZA VACCINE  Completed   Zoster Vaccines- Shingrix  Completed   HPV VACCINES  Aged Out    No Known Allergies  Allergies as of 07/15/2021   No Known Allergies      Medication List        Accurate as of July 15, 2021  9:31 AM. If you have any questions, ask your nurse or doctor.          amiodarone 200 MG tablet Commonly known as: PACERONE Take 200 mg by mouth daily. What changed: Another medication with the same name was removed. Continue taking this medication, and follow the directions you see here. Changed by: Joshua Dad, MD   ammonium lactate 12 % cream Commonly known as: AMLACTIN Apply 1 application topically as needed for dry skin.   apixaban 2.5 MG Tabs tablet Commonly known as: Eliquis Take 1 tablet (2.5 mg total) by mouth 2 (two) times daily. Please cancel all previous orders for current medication. Change in dosage or pill size.   aspirin EC 81 MG tablet Take 81 mg by mouth daily.   furosemide 40 MG tablet Commonly known as: LASIX Take 40 mg by mouth daily.   hydrALAZINE 25 MG tablet Commonly known as: APRESOLINE TAKE 1 TABLET BY MOUTH THREE TIMES DAILY   hydrocortisone ointment 0.5 % Apply 1 application topically 2 (two) times daily as needed for itching. Right thumb   hydrocortisone valerate cream 0.2 % Commonly known as: WESTCORT Apply 1 application topically 2 (two) times daily. To affected areas of ear, scalp, hair, and nostrils/nose   isosorbide mononitrate 30 MG 24 hr tablet Commonly known as: IMDUR Take 30 mg by mouth daily. What changed: Another medication with the same name was removed. Continue taking this medication, and follow the directions you see here. Changed by: Joshua Dad, MD   ketoconazole 2 % cream Commonly known as: NIZORAL Apply 1 application topically 2 (two) times daily. To affected areas of ear, scalp, hair, and nostrils/nose   ketoconazole 2 % shampoo Commonly known as: NIZORAL Apply 1 application topically 2 (two) times a week. With hair washing on Wednesday's and Saturday's   levothyroxine 150 MCG tablet Commonly known as: SYNTHROID Take 150 mcg by mouth daily before breakfast.   loratadine 10 MG tablet Commonly known as: CLARITIN Take 10 mg by mouth daily.   polyethylene glycol 17 g packet Commonly known as: MIRALAX / GLYCOLAX Take 17 g by mouth daily as needed for mild constipation.   spironolactone 25 MG tablet Commonly known as: ALDACTONE TAKE ONE TABLET BY MOUTH ONCE DAILY        Review of Systems  Constitutional:  Positive for activity change.  HENT: Negative.    Respiratory:  Positive for shortness of breath.   Cardiovascular:  Positive for leg swelling.  Gastrointestinal: Negative.   Genitourinary: Negative.   Musculoskeletal: Negative.   Skin: Negative.   Neurological: Negative.   Psychiatric/Behavioral: Negative.     Vitals:   07/15/21 0923  BP: (!) 151/67  Pulse: 78  Resp: 20  Temp: (!) 97.5 F (36.4 C)  SpO2: 94%  Weight: 132 lb 9.6 oz (60.1 kg)  Height: 5\' 4"  (1.626 m)   Body mass index is 22.76 kg/m.  Physical Exam Vitals reviewed.  Constitutional:      Appearance: Normal appearance.  HENT:     Head: Normocephalic.     Nose: Nose normal.     Mouth/Throat:     Mouth: Mucous membranes are moist.     Pharynx: Oropharynx is clear.  Eyes:     Pupils: Pupils are equal, round, and reactive to light.  Pulmonary:     Effort: Pulmonary effort is normal.     Comments: Few crackles in his right base  Abdominal:     General: Abdomen is flat. Bowel sounds are normal.     Palpations: Abdomen is soft.  Musculoskeletal:     Comments: Mild edema Bilateral  Skin:    General: Skin is warm.   Neurological:     General: No focal deficit present.     Mental Status: He is alert.  Psychiatric:        Mood and Affect: Mood normal.        Thought Content: Thought content normal.    Labs reviewed: Basic Metabolic Panel: Recent Labs    07/07/21 0425 07/08/21 0357 07/09/21 0341  NA 133* 134* 134*  K 4.8 4.5 4.3  CL 103 100 97*  CO2 22 26 27   GLUCOSE 118* 100* 98  BUN 34* 40* 44*  CREATININE 2.05* 2.71* 2.25*  CALCIUM 7.8* 7.9* 7.9*  MG 1.9  --   --    Liver Function Tests: Recent Labs    01/03/21 0000 07/06/21 2344 07/07/21 0425  AST 22 39 36  ALT 15 28 30   ALKPHOS 83 89 85  BILITOT  --  0.4 0.6  PROT  --  6.5 6.4*  ALBUMIN 4.0 2.3* 2.2*   No results for input(s): LIPASE, AMYLASE in the last 8760 hours. No results for input(s): AMMONIA in the last 8760 hours. CBC: Recent Labs    07/06/21 2344 07/07/21 0050 07/07/21 0425 07/08/21 0357  WBC 10.5  --  12.2* 10.3  NEUTROABS 8.3*  --  9.7*  --   HGB 10.3* 9.2* 11.0* 10.3*  HCT 32.6* 27.0* 34.1* 32.1*  MCV 100.6*  --  100.0 97.9  PLT 334  --  341 336   Cardiac Enzymes: No results for input(s): CKTOTAL, CKMB, CKMBINDEX, TROPONINI in the last 8760 hours. BNP: Invalid input(s): POCBNP Lab Results  Component Value Date   HGBA1C 5.5 10/18/2017   Lab Results  Component Value Date   TSH 1.29 02/28/2021   No results found for: VITAMINB12 No results found for: FOLATE No results found for: IRON, TIBC, FERRITIN  Imaging and Procedures obtained prior to SNF admission: DG Chest Port 1 View  Result Date: 07/07/2021 CLINICAL DATA:  Weakness and shortness of breath. EXAM: PORTABLE CHEST 1 VIEW COMPARISON:  October 22, 2017 FINDINGS: The lungs are hyperinflated. Diffuse, chronic appearing increased interstitial lung markings are seen. Mild areas of atelectasis and/or infiltrate are seen involving the mid left lung, bilateral lung bases and right upper lobe. There is no evidence of a pleural effusion or  pneumothorax. Evaluation of the superior mediastinum is limited secondary to patient positioning. The cardiac silhouette is moderately enlarged. Radiopaque surgical clips are seen within the left upper quadrant. Degenerative changes seen throughout the thoracic spine. IMPRESSION: Chronic appearing increased interstitial lung markings with mild areas of bilateral atelectasis and/or infiltrate. Electronically Signed   By: Virgina Norfolk M.D.   On: 07/07/2021 00:11   ECHOCARDIOGRAM COMPLETE  Result Date: 07/07/2021    ECHOCARDIOGRAM REPORT  Patient Name:   [name] Date of Exam: 07/07/2021 Medical Rec #:  [id number]        Height:       64.0 in Accession #:    [id number]       Weight:       145.0 lb Date of Birth:  04-16-1936       BSA:          1.706 m Patient Age:    51 years         BP:           107/55 mmHg Patient Gender: M                HR:           72 bpm. Exam Location:  Inpatient Procedure: 2D Echo, Cardiac Doppler and Color Doppler Indications:    CHF  History:        Patient has prior history of Echocardiogram examinations, most                 recent 07/28/2019. Arrythmias:LBBB; Risk Factors:Dyslipidemia.  Sonographer:    Maudry Mayhew [place], RVT, RDCS Referring Phys: [id number] [facility]  Sonographer Comments: No subcostal window. IMPRESSIONS  1. Left ventricular ejection fraction, by estimation, is 30 to 35%. The left ventricle has moderately decreased function. The left ventricle demonstrates regional wall motion abnormalities (see scoring diagram/findings for description). The left ventricular internal cavity size was mildly dilated. Left ventricular diastolic parameters are consistent with Grade II diastolic dysfunction (pseudonormalization). Elevated left ventricular end-diastolic pressure.  2. Right ventricular systolic function is mildly reduced. The right ventricular size is normal.  3. Left atrial size was moderately dilated.  4. Right atrial size was mild to  moderately dilated.  5. The mitral valve is normal in structure. Mild mitral valve regurgitation. No evidence of mitral stenosis.  6. The aortic valve was not well visualized. Aortic valve regurgitation is mild to moderate. Comparison(s): Changes from prior study are noted. Conclusion(s)/Recommendation(s): EF reduced compared to prior. Global hypokinesis with akinesis/dyskinesis of the entire septum, out of proportion to dyssychrony from LBBB. FINDINGS  Left Ventricle: Left ventricular ejection fraction, by estimation, is 30 to 35%. The left ventricle has moderately decreased function. The left ventricle demonstrates regional wall motion abnormalities. The left ventricular internal cavity size was mildly dilated. There is no left ventricular hypertrophy. Abnormal (paradoxical) septal motion, consistent with left bundle branch block. Left ventricular diastolic parameters are consistent with Grade II diastolic dysfunction (pseudonormalization). Elevated left ventricular end-diastolic pressure.  LV Wall Scoring: The anterior septum is dyskinetic. The inferior septum is akinetic. The entire anterior wall, entire lateral wall, entire inferior wall, and apex are hypokinetic. Right Ventricle: The right ventricular size is normal. Right vetricular wall thickness was not well visualized. Right ventricular systolic function is mildly reduced. Left Atrium: Left atrial size was moderately dilated. Right Atrium: Right atrial size was mild to moderately dilated. Pericardium: There is no evidence of pericardial effusion. Mitral Valve: The mitral valve is normal in structure. Mild mitral valve regurgitation. No evidence of mitral valve stenosis. Tricuspid Valve: The tricuspid valve is normal in structure. Tricuspid valve regurgitation is trivial. No evidence of tricuspid stenosis. Aortic Valve: The aortic valve was not well visualized. Aortic valve regurgitation is mild to moderate. Aortic regurgitation PHT measures 422 msec.  Aortic valve mean gradient measures 4.0 mmHg. Aortic valve peak gradient measures 7.0 mmHg. Aortic valve area, by VTI measures 1.56 cm. Pulmonic Valve:  The pulmonic valve was not well visualized. Pulmonic valve regurgitation is not visualized. Aorta: The aortic root is normal in size and structure, the aortic arch was not well visualized and the ascending aorta was not well visualized. IAS/Shunts: The atrial septum is grossly normal.  LEFT VENTRICLE PLAX 2D LVIDd:         5.75 cm   Diastology LVIDs:         4.90 cm   LV e' medial:    4.12 cm/s LV PW:         0.70 cm   LV E/e' medial:  30.3 LV IVS:        0.60 cm   LV e' lateral:   8.93 cm/s LVOT diam:     1.70 cm   LV E/e' lateral: 14.0 LV SV:         44 LV SV Index:   26 LVOT Area:     2.27 cm  RIGHT VENTRICLE TAPSE (M-mode): 1.5 cm LEFT ATRIUM             Index        RIGHT ATRIUM           Index LA diam:        3.50 cm 2.05 cm/m   RA Area:     17.20 cm LA Vol (A2C):   48.9 ml 28.66 ml/m  RA Volume:   48.10 ml  28.19 ml/m LA Vol (A4C):   73.1 ml 42.84 ml/m LA Biplane Vol: 59.9 ml 35.10 ml/m  AORTIC VALVE AV Area (Vmax):    1.61 cm AV Area (Vmean):   1.57 cm AV Area (VTI):     1.56 cm AV Vmax:           132.00 cm/s AV Vmean:          85.500 cm/s AV VTI:            0.280 m AV Peak Grad:      7.0 mmHg AV Mean Grad:      4.0 mmHg LVOT Vmax:         93.80 cm/s LVOT Vmean:        59.100 cm/s LVOT VTI:          0.192 m LVOT/AV VTI ratio: 0.69 AI PHT:            422 msec  AORTA Ao Root diam: 3.10 cm MITRAL VALVE                TRICUSPID VALVE MV Area (PHT): 2.07 cm     TR Peak grad:   12.4 mmHg MV Decel Time: 366 msec     TR Vmax:        176.00 cm/s MV E velocity: 124.67 cm/s MV A velocity: 93.60 cm/s   SHUNTS MV E/A ratio:  1.33         Systemic VTI:  0.19 m                             Systemic Diam: 1.70 cm Buford Dresser MD Electronically signed by Buford Dresser MD Signature Date/Time: 07/07/2021/2:22:20 PM    Final      Assessment/Plan Acute on chronic systolic CHF (congestive heart failure) (HCC) Weight slightly up with Mild edema Denies SOB Will continue same dose of Lasix for now Continue to monitor Weight Also on Spironolactone On Aspirin FR of 1500 cc Off Oxygen now Plans to go back  to [place] per therapy doing well Paroxysmal atrial flutter (HCC) On Eliquis and Amiodarone  CKD (chronic kidney disease) stage 4, GFR 15-29 ml/min (HCC) Creat stable on this dose of Lasix Essential hypertension Continue Imdur and Hydralazine  Acquired hypothyroidism TSH normal in 6/22 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

## 2021-07-16 ENCOUNTER — Encounter: Payer: Self-pay | Admitting: Orthopedic Surgery

## 2021-07-16 ENCOUNTER — Non-Acute Institutional Stay (SKILLED_NURSING_FACILITY): Payer: Medicare HMO | Admitting: Orthopedic Surgery

## 2021-07-16 DIAGNOSIS — I10 Essential (primary) hypertension: Secondary | ICD-10-CM

## 2021-07-16 DIAGNOSIS — I4892 Unspecified atrial flutter: Secondary | ICD-10-CM | POA: Diagnosis not present

## 2021-07-16 DIAGNOSIS — M6389 Disorders of muscle in diseases classified elsewhere, multiple sites: Secondary | ICD-10-CM | POA: Diagnosis not present

## 2021-07-16 DIAGNOSIS — N184 Chronic kidney disease, stage 4 (severe): Secondary | ICD-10-CM

## 2021-07-16 DIAGNOSIS — I5023 Acute on chronic systolic (congestive) heart failure: Secondary | ICD-10-CM | POA: Diagnosis not present

## 2021-07-16 DIAGNOSIS — E039 Hypothyroidism, unspecified: Secondary | ICD-10-CM

## 2021-07-16 DIAGNOSIS — R0602 Shortness of breath: Secondary | ICD-10-CM | POA: Diagnosis not present

## 2021-07-16 DIAGNOSIS — R278 Other lack of coordination: Secondary | ICD-10-CM | POA: Diagnosis not present

## 2021-07-16 NOTE — Progress Notes (Signed)
Location:  [facility] Room Number: [place] of Service:  SNF [phone]) Provider:  Windell Moulding, AGNP-C  [name], MD  Patient Care Team: [name], MD as PCP - General (Internal Medicine) Gayland Curry, DO as PCP - Internal Medicine (Geriatric Medicine) Bensimhon, Shaune Pascal, MD as PCP - Advanced Heart Failure (Cardiology) Royal Hawthorn, NP as Nurse Practitioner (Nurse Practitioner)  Extended Emergency Contact Information Primary Emergency Contact: Ryker,Roger Address: [address]          [place] [address] [name] Phone: [phone] Mobile Phone: [phone] Relation: Brother  Code Status:  DNR Goals of care: Advanced Directive information Advanced Directives 07/15/2021  Does Patient Have a Medical Advance Directive? Yes  Type of Advance Directive Out of facility DNR (pink MOST or yellow form)  Does patient want to make changes to medical advance directive? No - Patient declined  Copy of [place] in Chart? -  Would patient like information on creating a medical advance directive? -  Pre-existing out of facility DNR order (yellow form or pink MOST form) Pink Most/Yellow Form available - Physician notified to receive inpatient order     Chief Complaint  Patient presents with   Discharge Note    HPI:  Pt is a 85 y.o. male seen today for discharge evaluation.   He currently resides on the rehabilitation unit at [facility]. PMH: LBBB, HTN, aflutter, secondary cardiomyopathy, venous insufficiency, COPD, hypothyroidism, CKD stage 4, HLD, and right shoulder pain.   Hospitalized 10/22- 10/25 due to acute on chronic CHF. He noticed progressive shortness of breath prior to hospitalization. He was found to have bilateral pulmonary infiltrates and elevated BNP 901. LV EF 30-35% 07/07/2021. He was placed on strict I/O, daily weights and fluid restriction diet. Advised to resume home medications at discharge.  ARB discontinued due to increased creatinine. Baseline creatinine 2.0-2.4.   Admitted to [facility] after hospitalization.   Today, he is ambulating on his own. Reports some shortness of breath with exertion, but not using oxygen. Sats > 93%. Discussed importance of daily weights for next 2 weeks. Heart healthy diet also discussed. He plans to cut french fried and salty ham out of his diet.   Recent weights:  11/01- 132 lbs  10/31- 134.2 lbs  10/30- 132.6 lbs  Recent blood pressures:  11/01- 149/70  10/31- 155/68, 132/65, 144/64  No recent falls, injuries or behavioral outbursts.   He is scheduled to f/u with [facility] 07/19/2021.   He plans to discharge back to AL today. Wife and nursing staff will be helping with care/needs. He will continue PT/OT.        Past Medical History:  Diagnosis Date   Arthritis    Atrial fibrillation (HCC)    Atrial flutter (HCC)    Cardiomyopathy    Nonischemic, LVEF 20%   CHF (congestive heart failure) (HCC)    Chronic renal insufficiency    COPD (chronic obstructive pulmonary disease) ([place])    Fracture of unspecified part of left clavicle, initial encounter for closed fracture    Hypothyroidism    Left ankle sprain    Left bundle branch block    Mixed hyperlipidemia    Renal failure    Scoliosis    Past Surgical History:  Procedure Laterality Date   EYE SURGERY     LAPAROSCOPIC CHOLECYSTECTOMY  2008   left foot surgery  1998   MASS EXCISION Left 06/26/2016  Procedure: EXCISION Mucoid tumor;  Surgeon: Daryll Brod, MD;  Location: [facility];  Service: Orthopedics;  Laterality: Left;  FAB   Right inguinal herniorrhaphy     ROTATION FLAP Left 06/26/2016   Procedure: debridement of distal interphalangeal possible, ROTATION FLAP left index finger;  Surgeon: Daryll Brod, MD;  Location: [facility];  Service: Orthopedics;  Laterality: Left;   SKIN CANCER REMOVED  LEFT EAR     TONSILLECTOMY      No Known  Allergies  Outpatient Encounter Medications as of 07/16/2021  Medication Sig   amiodarone (PACERONE) 200 MG tablet Take 200 mg by mouth daily.   ammonium lactate (AMLACTIN) 12 % cream Apply 1 application topically as needed for dry skin.   apixaban (ELIQUIS) 2.5 MG TABS tablet Take 1 tablet (2.5 mg total) by mouth 2 (two) times daily. Please cancel all previous orders for current medication. Change in dosage or pill size.   aspirin EC 81 MG tablet Take 81 mg by mouth daily.   furosemide (LASIX) 40 MG tablet Take 40 mg by mouth daily.   hydrALAZINE (APRESOLINE) 25 MG tablet TAKE 1 TABLET BY MOUTH THREE TIMES DAILY   hydrocortisone ointment 0.5 % Apply 1 application topically 2 (two) times daily as needed for itching. Right thumb   hydrocortisone valerate cream (WESTCORT) 0.2 % Apply 1 application topically 2 (two) times daily. To affected areas of ear, scalp, hair, and nostrils/nose   isosorbide mononitrate (IMDUR) 30 MG 24 hr tablet Take 30 mg by mouth daily.   ketoconazole (NIZORAL) 2 % cream Apply 1 application topically 2 (two) times daily. To affected areas of ear, scalp, hair, and nostrils/nose   ketoconazole (NIZORAL) 2 % shampoo Apply 1 application topically 2 (two) times a week. With hair washing on Wednesday's and Saturday's   levothyroxine (SYNTHROID) 150 MCG tablet Take 150 mcg by mouth daily before breakfast.   loratadine (CLARITIN) 10 MG tablet Take 10 mg by mouth daily.   polyethylene glycol (MIRALAX / GLYCOLAX) 17 g packet Take 17 g by mouth daily as needed for mild constipation.   spironolactone (ALDACTONE) 25 MG tablet TAKE ONE TABLET BY MOUTH ONCE DAILY   No facility-administered encounter medications on file as of 07/16/2021.    Review of Systems  Constitutional:  Negative for activity change, appetite change, chills, fatigue and fever.  HENT:  Negative for congestion and trouble swallowing.   Eyes:  Negative for visual disturbance.  Respiratory:  Positive for shortness of  breath. Negative for cough and wheezing.   Cardiovascular:  Positive for leg swelling. Negative for chest pain.  Gastrointestinal:  Negative for abdominal distention, abdominal pain, blood in stool, constipation, diarrhea, nausea and vomiting.  Genitourinary:  Negative for dysuria, frequency and hematuria.  Musculoskeletal:  Positive for arthralgias, back pain and gait problem.       Right shoulder pain  Skin: Negative.   Neurological:  Positive for weakness. Negative for dizziness and headaches.  Psychiatric/Behavioral:  Negative for confusion and dysphoric mood. The patient is not nervous/anxious.    Immunization History  Administered Date(s) Administered   Fluad Quad(high Dose 65+) 06/19/2021   Influenza Inj Mdck Quad Pf 07/04/2016   Influenza Inj Mdck Quad With Preservative 06/18/2017   Influenza Split 06/02/2014, 06/02/2014, 06/14/2015   Influenza, High Dose Seasonal PF 07/14/2019, 07/06/2020   Influenza,inj,Quad PF,6+ Mos 07/15/2018   Moderna SARS-COV2 Booster Vaccination 06/26/2021   Moderna Sars-Covid-2 Vaccination 09/25/2019, 10/25/2019, 07/28/2020   Pneumococcal Conjugate-13 02/07/2016   Pneumococcal Polysaccharide-23 03/09/2018  Tdap 03/06/2018   Zoster Recombinat (Shingrix) 03/26/2018, 03/26/2021   Pertinent  Health Maintenance Due  Topic Date Due   INFLUENZA VACCINE  Completed   Fall Risk 07/07/2021 07/07/2021 07/08/2021 07/08/2021 07/09/2021  Falls in the past year? - - - - -  Was there an injury with Fall? - - - - -  Was there an injury with Fall? - - - - -  Fall Risk Category Calculator - - - - -  Fall Risk Category - - - - -  Patient Fall Risk Level Moderate fall risk Moderate fall risk Moderate fall risk Moderate fall risk Moderate fall risk  Patient at Risk for Falls Due to - - - - -  Fall risk Follow up - - - - -   Functional Status Survey:    Vitals:   07/16/21 1601  BP: (!) 149/70  Pulse: 74  Resp: 20  Temp: 98.1 F (36.7 C)  SpO2: 93%  Weight:  132 lb (59.9 kg)   Body mass index is 22.66 kg/m. Physical Exam Vitals reviewed.  Constitutional:      General: He is not in acute distress. HENT:     Head: Normocephalic.  Eyes:     General:        Right eye: No discharge.        Left eye: No discharge.  Neck:     Comments: Forward neck protrusion Cardiovascular:     Rate and Rhythm: Normal rate. Rhythm irregular.     Pulses: Normal pulses.  Pulmonary:     Effort: Pulmonary effort is normal. No respiratory distress.     Breath sounds: Normal breath sounds. No wheezing.  Abdominal:     General: Bowel sounds are normal. There is no distension.     Palpations: Abdomen is soft.     Tenderness: There is no abdominal tenderness.  Musculoskeletal:     Cervical back: Normal range of motion. Rigidity present.     Right lower leg: No edema.     Left lower leg: No edema.     Comments: Kyphosis  Lymphadenopathy:     Cervical: No cervical adenopathy.  Skin:    General: Skin is warm and dry.     Capillary Refill: Capillary refill takes less than 2 seconds.  Neurological:     General: No focal deficit present.     Mental Status: He is alert and oriented to person, place, and time.     Motor: Weakness present.     Gait: Gait abnormal.  Psychiatric:        Mood and Affect: Mood normal.        Behavior: Behavior normal.    Labs reviewed: Recent Labs    07/07/21 0425 07/08/21 0357 07/09/21 0341  NA 133* 134* 134*  K 4.8 4.5 4.3  CL 103 100 97*  CO2 22 26 27   GLUCOSE 118* 100* 98  BUN 34* 40* 44*  CREATININE 2.05* 2.71* 2.25*  CALCIUM 7.8* 7.9* 7.9*  MG 1.9  --   --    Recent Labs    01/03/21 0000 07/06/21 2344 07/07/21 0425  AST 22 39 36  ALT 15 28 30   ALKPHOS 83 89 85  BILITOT  --  0.4 0.6  PROT  --  6.5 6.4*  ALBUMIN 4.0 2.3* 2.2*   Recent Labs    07/06/21 2344 07/07/21 0050 07/07/21 0425 07/08/21 0357  WBC 10.5  --  12.2* 10.3  NEUTROABS 8.3*  --  9.7*  --  HGB 10.3* 9.2* 11.0* 10.3*  HCT 32.6*  27.0* 34.1* 32.1*  MCV 100.6*  --  100.0 97.9  PLT 334  --  341 336   Lab Results  Component Value Date   TSH 1.29 02/28/2021   Lab Results  Component Value Date   HGBA1C 5.5 10/18/2017   Lab Results  Component Value Date   CHOL 171 01/03/2021   HDL 59 01/03/2021   LDLCALC 92 01/03/2021   TRIG 102 01/03/2021   CHOLHDL 3.4 10/18/2017    Significant Diagnostic Results in last 30 days:  DG Chest Port 1 View  Result Date: 07/07/2021 CLINICAL DATA:  Weakness and shortness of breath. EXAM: PORTABLE CHEST 1 VIEW COMPARISON:  October 22, 2017 FINDINGS: The lungs are hyperinflated. Diffuse, chronic appearing increased interstitial lung markings are seen. Mild areas of atelectasis and/or infiltrate are seen involving the mid left lung, bilateral lung bases and right upper lobe. There is no evidence of a pleural effusion or pneumothorax. Evaluation of the superior mediastinum is limited secondary to patient positioning. The cardiac silhouette is moderately enlarged. Radiopaque surgical clips are seen within the left upper quadrant. Degenerative changes seen throughout the thoracic spine. IMPRESSION: Chronic appearing increased interstitial lung markings with mild areas of bilateral atelectasis and/or infiltrate. Electronically Signed   By: Virgina Norfolk M.D.   On: 07/07/2021 00:11   ECHOCARDIOGRAM COMPLETE  Result Date: 07/07/2021    ECHOCARDIOGRAM REPORT   Patient Name:   HAYDIN CALANDRA Date of Exam: 07/07/2021 Medical Rec #:  [id number]        Height:       64.0 in Accession #:    [id number]       Weight:       145.0 lb Date of Birth:  Jun 21, 1936       BSA:          1.706 m Patient Age:    27 years         BP:           107/55 mmHg Patient Gender: M                HR:           72 bpm. Exam Location:  Inpatient Procedure: 2D Echo, Cardiac Doppler and Color Doppler Indications:    CHF  History:        Patient has prior history of Echocardiogram examinations, most                 recent  07/28/2019. Arrythmias:LBBB; Risk Factors:Dyslipidemia.  Sonographer:    Maudry Mayhew [place], RVT, RDCS Referring Phys: [id number] [facility]  Sonographer Comments: No subcostal window. IMPRESSIONS  1. Left ventricular ejection fraction, by estimation, is 30 to 35%. The left ventricle has moderately decreased function. The left ventricle demonstrates regional wall motion abnormalities (see scoring diagram/findings for description). The left ventricular internal cavity size was mildly dilated. Left ventricular diastolic parameters are consistent with Grade II diastolic dysfunction (pseudonormalization). Elevated left ventricular end-diastolic pressure.  2. Right ventricular systolic function is mildly reduced. The right ventricular size is normal.  3. Left atrial size was moderately dilated.  4. Right atrial size was mild to moderately dilated.  5. The mitral valve is normal in structure. Mild mitral valve regurgitation. No evidence of mitral stenosis.  6. The aortic valve was not well visualized. Aortic valve regurgitation is mild to moderate. Comparison(s): Changes from prior study are noted. Conclusion(s)/Recommendation(s): EF reduced compared to prior. Global  hypokinesis with akinesis/dyskinesis of the entire septum, out of proportion to dyssychrony from LBBB. FINDINGS  Left Ventricle: Left ventricular ejection fraction, by estimation, is 30 to 35%. The left ventricle has moderately decreased function. The left ventricle demonstrates regional wall motion abnormalities. The left ventricular internal cavity size was mildly dilated. There is no left ventricular hypertrophy. Abnormal (paradoxical) septal motion, consistent with left bundle branch block. Left ventricular diastolic parameters are consistent with Grade II diastolic dysfunction (pseudonormalization). Elevated left ventricular end-diastolic pressure.  LV Wall Scoring: The anterior septum is dyskinetic. The inferior septum is akinetic. The entire  anterior wall, entire lateral wall, entire inferior wall, and apex are hypokinetic. Right Ventricle: The right ventricular size is normal. Right vetricular wall thickness was not well visualized. Right ventricular systolic function is mildly reduced. Left Atrium: Left atrial size was moderately dilated. Right Atrium: Right atrial size was mild to moderately dilated. Pericardium: There is no evidence of pericardial effusion. Mitral Valve: The mitral valve is normal in structure. Mild mitral valve regurgitation. No evidence of mitral valve stenosis. Tricuspid Valve: The tricuspid valve is normal in structure. Tricuspid valve regurgitation is trivial. No evidence of tricuspid stenosis. Aortic Valve: The aortic valve was not well visualized. Aortic valve regurgitation is mild to moderate. Aortic regurgitation PHT measures 422 msec. Aortic valve mean gradient measures 4.0 mmHg. Aortic valve peak gradient measures 7.0 mmHg. Aortic valve area, by VTI measures 1.56 cm. Pulmonic Valve: The pulmonic valve was not well visualized. Pulmonic valve regurgitation is not visualized. Aorta: The aortic root is normal in size and structure, the aortic arch was not well visualized and the ascending aorta was not well visualized. IAS/Shunts: The atrial septum is grossly normal.  LEFT VENTRICLE PLAX 2D LVIDd:         5.75 cm   Diastology LVIDs:         4.90 cm   LV e' medial:    4.12 cm/s LV PW:         0.70 cm   LV E/e' medial:  30.3 LV IVS:        0.60 cm   LV e' lateral:   8.93 cm/s LVOT diam:     1.70 cm   LV E/e' lateral: 14.0 LV SV:         44 LV SV Index:   26 LVOT Area:     2.27 cm  RIGHT VENTRICLE TAPSE (M-mode): 1.5 cm LEFT ATRIUM             Index        RIGHT ATRIUM           Index LA diam:        3.50 cm 2.05 cm/m   RA Area:     17.20 cm LA Vol (A2C):   48.9 ml 28.66 ml/m  RA Volume:   48.10 ml  28.19 ml/m LA Vol (A4C):   73.1 ml 42.84 ml/m LA Biplane Vol: 59.9 ml 35.10 ml/m  AORTIC VALVE AV Area (Vmax):    1.61 cm  AV Area (Vmean):   1.57 cm AV Area (VTI):     1.56 cm AV Vmax:           132.00 cm/s AV Vmean:          85.500 cm/s AV VTI:            0.280 m AV Peak Grad:      7.0 mmHg AV Mean Grad:      4.0 mmHg  LVOT Vmax:         93.80 cm/s LVOT Vmean:        59.100 cm/s LVOT VTI:          0.192 m LVOT/AV VTI ratio: 0.69 AI PHT:            422 msec  AORTA Ao Root diam: 3.10 cm MITRAL VALVE                TRICUSPID VALVE MV Area (PHT): 2.07 cm     TR Peak grad:   12.4 mmHg MV Decel Time: 366 msec     TR Vmax:        176.00 cm/s MV E velocity: 124.67 cm/s MV A velocity: 93.60 cm/s   SHUNTS MV E/A ratio:  1.33         Systemic VTI:  0.19 m                             Systemic Diam: 1.70 cm Buford Dresser MD Electronically signed by Buford Dresser MD Signature Date/Time: 07/07/2021/2:22:20 PM    Final     Assessment/Plan 1. Acute on chronic systolic CHF (congestive heart failure) ([place]) - hospitalized 10/22-10/25- BNP 901 - LV EF 30-35%  - cont spirolactone and furosemide daily - cont daily weights x 14 days, then twice weekly - report weight gain > 3lbs/day to provider - cont 1500 cc fluid restriction - cont low sodium diet  2. Paroxysmal atrial flutter (HCC) - rate controlled amiodarone - cont Eliquis for clot prevention  3. CKD (chronic kidney disease) stage 4, GFR 15-29 ml/min (HCC) - Baseline creatinine 2.0-2.4 -ARB discontinued last hospitalization - cont to avoid nephrotoxic drugs like NSAIDS and dose adjust medications to be renally excreted  4. Essential hypertension - controlled- remains on diuretics for CHF  5. Acquired hypothyroidism - TSH 1.29 02/2021 - cont levothyroxine  Advised patient to follow up with PCP/clinic in 2 weeks.     Family/ staff Communication: plan discussed with patient and nurse  Labs/tests ordered:  none

## 2021-07-17 ENCOUNTER — Telehealth: Payer: Self-pay | Admitting: Internal Medicine

## 2021-07-17 DIAGNOSIS — N184 Chronic kidney disease, stage 4 (severe): Secondary | ICD-10-CM | POA: Diagnosis not present

## 2021-07-17 DIAGNOSIS — I5023 Acute on chronic systolic (congestive) heart failure: Secondary | ICD-10-CM | POA: Diagnosis not present

## 2021-07-17 DIAGNOSIS — I5022 Chronic systolic (congestive) heart failure: Secondary | ICD-10-CM | POA: Diagnosis not present

## 2021-07-17 DIAGNOSIS — R0602 Shortness of breath: Secondary | ICD-10-CM | POA: Diagnosis not present

## 2021-07-17 DIAGNOSIS — R5383 Other fatigue: Secondary | ICD-10-CM | POA: Diagnosis not present

## 2021-07-17 DIAGNOSIS — R278 Other lack of coordination: Secondary | ICD-10-CM | POA: Diagnosis not present

## 2021-07-17 DIAGNOSIS — R2689 Other abnormalities of gait and mobility: Secondary | ICD-10-CM | POA: Diagnosis not present

## 2021-07-17 DIAGNOSIS — R2681 Unsteadiness on feet: Secondary | ICD-10-CM | POA: Diagnosis not present

## 2021-07-17 DIAGNOSIS — M6389 Disorders of muscle in diseases classified elsewhere, multiple sites: Secondary | ICD-10-CM | POA: Diagnosis not present

## 2021-07-18 ENCOUNTER — Other Ambulatory Visit: Payer: Self-pay

## 2021-07-18 ENCOUNTER — Encounter (HOSPITAL_COMMUNITY): Payer: Self-pay | Admitting: Emergency Medicine

## 2021-07-18 ENCOUNTER — Encounter: Payer: Self-pay | Admitting: Adult Health

## 2021-07-18 ENCOUNTER — Non-Acute Institutional Stay: Payer: Medicare HMO | Admitting: Adult Health

## 2021-07-18 ENCOUNTER — Inpatient Hospital Stay (HOSPITAL_COMMUNITY)
Admission: EM | Admit: 2021-07-18 | Discharge: 2021-08-15 | DRG: 193 | Disposition: E | Payer: Medicare HMO | Attending: Family Medicine | Admitting: Family Medicine

## 2021-07-18 ENCOUNTER — Emergency Department (HOSPITAL_COMMUNITY): Payer: Medicare HMO

## 2021-07-18 DIAGNOSIS — Z20822 Contact with and (suspected) exposure to covid-19: Secondary | ICD-10-CM | POA: Diagnosis not present

## 2021-07-18 DIAGNOSIS — Z79890 Hormone replacement therapy (postmenopausal): Secondary | ICD-10-CM

## 2021-07-18 DIAGNOSIS — L89151 Pressure ulcer of sacral region, stage 1: Secondary | ICD-10-CM | POA: Diagnosis present

## 2021-07-18 DIAGNOSIS — E039 Hypothyroidism, unspecified: Secondary | ICD-10-CM | POA: Diagnosis present

## 2021-07-18 DIAGNOSIS — J189 Pneumonia, unspecified organism: Secondary | ICD-10-CM | POA: Diagnosis not present

## 2021-07-18 DIAGNOSIS — Z515 Encounter for palliative care: Secondary | ICD-10-CM

## 2021-07-18 DIAGNOSIS — I4892 Unspecified atrial flutter: Secondary | ICD-10-CM | POA: Diagnosis not present

## 2021-07-18 DIAGNOSIS — J449 Chronic obstructive pulmonary disease, unspecified: Secondary | ICD-10-CM | POA: Diagnosis present

## 2021-07-18 DIAGNOSIS — I5023 Acute on chronic systolic (congestive) heart failure: Secondary | ICD-10-CM

## 2021-07-18 DIAGNOSIS — I5022 Chronic systolic (congestive) heart failure: Secondary | ICD-10-CM | POA: Diagnosis present

## 2021-07-18 DIAGNOSIS — I2510 Atherosclerotic heart disease of native coronary artery without angina pectoris: Secondary | ICD-10-CM | POA: Diagnosis present

## 2021-07-18 DIAGNOSIS — K7200 Acute and subacute hepatic failure without coma: Secondary | ICD-10-CM | POA: Diagnosis present

## 2021-07-18 DIAGNOSIS — N179 Acute kidney failure, unspecified: Secondary | ICD-10-CM | POA: Diagnosis present

## 2021-07-18 DIAGNOSIS — E782 Mixed hyperlipidemia: Secondary | ICD-10-CM | POA: Diagnosis present

## 2021-07-18 DIAGNOSIS — Z7982 Long term (current) use of aspirin: Secondary | ICD-10-CM

## 2021-07-18 DIAGNOSIS — Z85828 Personal history of other malignant neoplasm of skin: Secondary | ICD-10-CM

## 2021-07-18 DIAGNOSIS — I130 Hypertensive heart and chronic kidney disease with heart failure and stage 1 through stage 4 chronic kidney disease, or unspecified chronic kidney disease: Secondary | ICD-10-CM | POA: Diagnosis present

## 2021-07-18 DIAGNOSIS — M1990 Unspecified osteoarthritis, unspecified site: Secondary | ICD-10-CM | POA: Diagnosis present

## 2021-07-18 DIAGNOSIS — J440 Chronic obstructive pulmonary disease with acute lower respiratory infection: Secondary | ICD-10-CM | POA: Diagnosis present

## 2021-07-18 DIAGNOSIS — Z833 Family history of diabetes mellitus: Secondary | ICD-10-CM

## 2021-07-18 DIAGNOSIS — E875 Hyperkalemia: Secondary | ICD-10-CM | POA: Diagnosis present

## 2021-07-18 DIAGNOSIS — Z8261 Family history of arthritis: Secondary | ICD-10-CM

## 2021-07-18 DIAGNOSIS — J9601 Acute respiratory failure with hypoxia: Secondary | ICD-10-CM | POA: Diagnosis present

## 2021-07-18 DIAGNOSIS — I428 Other cardiomyopathies: Secondary | ICD-10-CM | POA: Diagnosis present

## 2021-07-18 DIAGNOSIS — J90 Pleural effusion, not elsewhere classified: Secondary | ICD-10-CM | POA: Diagnosis not present

## 2021-07-18 DIAGNOSIS — I5043 Acute on chronic combined systolic (congestive) and diastolic (congestive) heart failure: Secondary | ICD-10-CM | POA: Diagnosis present

## 2021-07-18 DIAGNOSIS — J9621 Acute and chronic respiratory failure with hypoxia: Secondary | ICD-10-CM | POA: Diagnosis not present

## 2021-07-18 DIAGNOSIS — Z9049 Acquired absence of other specified parts of digestive tract: Secondary | ICD-10-CM

## 2021-07-18 DIAGNOSIS — E871 Hypo-osmolality and hyponatremia: Secondary | ICD-10-CM | POA: Diagnosis present

## 2021-07-18 DIAGNOSIS — I480 Paroxysmal atrial fibrillation: Secondary | ICD-10-CM | POA: Diagnosis present

## 2021-07-18 DIAGNOSIS — I10 Essential (primary) hypertension: Secondary | ICD-10-CM | POA: Diagnosis not present

## 2021-07-18 DIAGNOSIS — I872 Venous insufficiency (chronic) (peripheral): Secondary | ICD-10-CM | POA: Diagnosis present

## 2021-07-18 DIAGNOSIS — R0902 Hypoxemia: Secondary | ICD-10-CM | POA: Diagnosis not present

## 2021-07-18 DIAGNOSIS — R2681 Unsteadiness on feet: Secondary | ICD-10-CM | POA: Diagnosis not present

## 2021-07-18 DIAGNOSIS — L8990 Pressure ulcer of unspecified site, unspecified stage: Secondary | ICD-10-CM | POA: Insufficient documentation

## 2021-07-18 DIAGNOSIS — Z7901 Long term (current) use of anticoagulants: Secondary | ICD-10-CM

## 2021-07-18 DIAGNOSIS — Z8249 Family history of ischemic heart disease and other diseases of the circulatory system: Secondary | ICD-10-CM

## 2021-07-18 DIAGNOSIS — I5042 Chronic combined systolic (congestive) and diastolic (congestive) heart failure: Secondary | ICD-10-CM | POA: Diagnosis not present

## 2021-07-18 DIAGNOSIS — R570 Cardiogenic shock: Secondary | ICD-10-CM | POA: Diagnosis not present

## 2021-07-18 DIAGNOSIS — R278 Other lack of coordination: Secondary | ICD-10-CM | POA: Diagnosis not present

## 2021-07-18 DIAGNOSIS — N184 Chronic kidney disease, stage 4 (severe): Secondary | ICD-10-CM | POA: Diagnosis present

## 2021-07-18 DIAGNOSIS — I959 Hypotension, unspecified: Secondary | ICD-10-CM | POA: Diagnosis not present

## 2021-07-18 DIAGNOSIS — I517 Cardiomegaly: Secondary | ICD-10-CM | POA: Diagnosis not present

## 2021-07-18 DIAGNOSIS — Z66 Do not resuscitate: Secondary | ICD-10-CM | POA: Diagnosis not present

## 2021-07-18 DIAGNOSIS — Z881 Allergy status to other antibiotic agents status: Secondary | ICD-10-CM

## 2021-07-18 DIAGNOSIS — Z9981 Dependence on supplemental oxygen: Secondary | ICD-10-CM

## 2021-07-18 DIAGNOSIS — K5900 Constipation, unspecified: Secondary | ICD-10-CM | POA: Diagnosis present

## 2021-07-18 DIAGNOSIS — R069 Unspecified abnormalities of breathing: Secondary | ICD-10-CM | POA: Diagnosis not present

## 2021-07-18 DIAGNOSIS — Z79899 Other long term (current) drug therapy: Secondary | ICD-10-CM

## 2021-07-18 DIAGNOSIS — R0602 Shortness of breath: Secondary | ICD-10-CM | POA: Diagnosis not present

## 2021-07-18 DIAGNOSIS — R5383 Other fatigue: Secondary | ICD-10-CM | POA: Diagnosis not present

## 2021-07-18 DIAGNOSIS — R059 Cough, unspecified: Secondary | ICD-10-CM | POA: Diagnosis not present

## 2021-07-18 DIAGNOSIS — R2689 Other abnormalities of gait and mobility: Secondary | ICD-10-CM | POA: Diagnosis not present

## 2021-07-18 DIAGNOSIS — I447 Left bundle-branch block, unspecified: Secondary | ICD-10-CM | POA: Diagnosis present

## 2021-07-18 DIAGNOSIS — R0689 Other abnormalities of breathing: Secondary | ICD-10-CM | POA: Diagnosis not present

## 2021-07-18 DIAGNOSIS — M6389 Disorders of muscle in diseases classified elsewhere, multiple sites: Secondary | ICD-10-CM | POA: Diagnosis not present

## 2021-07-18 LAB — RESP PANEL BY RT-PCR (FLU A&B, COVID) ARPGX2
Influenza A by PCR: NEGATIVE
Influenza B by PCR: NEGATIVE
SARS Coronavirus 2 by RT PCR: NEGATIVE

## 2021-07-18 LAB — BASIC METABOLIC PANEL
Anion gap: 11 (ref 5–15)
BUN: 54 mg/dL — ABNORMAL HIGH (ref 8–23)
CO2: 20 mmol/L — ABNORMAL LOW (ref 22–32)
Calcium: 7.9 mg/dL — ABNORMAL LOW (ref 8.9–10.3)
Chloride: 100 mmol/L (ref 98–111)
Creatinine, Ser: 2.8 mg/dL — ABNORMAL HIGH (ref 0.61–1.24)
GFR, Estimated: 22 mL/min — ABNORMAL LOW (ref >60)
Glucose, Bld: 161 mg/dL — ABNORMAL HIGH (ref 70–99)
Potassium: 5 mmol/L (ref 3.5–5.1)
Sodium: 131 mmol/L — ABNORMAL LOW (ref 135–145)

## 2021-07-18 LAB — CBC
HCT: 31.8 % — ABNORMAL LOW (ref 39.0–52.0)
Hemoglobin: 10.3 g/dL — ABNORMAL LOW (ref 13.0–17.0)
MCH: 31.5 pg (ref 26.0–34.0)
MCHC: 32.4 g/dL (ref 30.0–36.0)
MCV: 97.2 fL (ref 80.0–100.0)
Platelets: 375 10*3/uL (ref 150–400)
RBC: 3.27 MIL/uL — ABNORMAL LOW (ref 4.22–5.81)
RDW: 13.2 % (ref 11.5–15.5)
WBC: 13.2 10*3/uL — ABNORMAL HIGH (ref 4.0–10.5)
nRBC: 0 % (ref 0.0–0.2)

## 2021-07-18 LAB — PROCALCITONIN: Procalcitonin: 0.31 ng/mL

## 2021-07-18 LAB — LACTIC ACID, PLASMA: Lactic Acid, Venous: 1.4 mmol/L (ref 0.5–1.9)

## 2021-07-18 MED ORDER — SODIUM CHLORIDE 0.9 % IV BOLUS (SEPSIS)
500.0000 mL | Freq: Once | INTRAVENOUS | Status: AC
  Administered 2021-07-18: 500 mL via INTRAVENOUS

## 2021-07-18 MED ORDER — LACTATED RINGERS IV SOLN: INTRAVENOUS | Status: DC

## 2021-07-18 MED ORDER — FUROSEMIDE 40 MG PO TABS: 40.0000 mg | ORAL_TABLET | Freq: Every day | ORAL | 0 refills | Status: AC

## 2021-07-18 MED ORDER — CEFDINIR 300 MG PO CAPS: 300.0000 mg | ORAL_CAPSULE | Freq: Two times a day (BID) | ORAL | 0 refills | Status: DC

## 2021-07-18 MED ORDER — SODIUM CHLORIDE 0.9 % IV SOLN
2.0000 g | INTRAVENOUS | Status: DC
  Administered 2021-07-20: 2 g via INTRAVENOUS
  Filled 2021-07-18: qty 2

## 2021-07-18 MED ORDER — DOXYCYCLINE HYCLATE 100 MG PO TABS: 100.0000 mg | ORAL_TABLET | Freq: Two times a day (BID) | ORAL | 0 refills | Status: AC

## 2021-07-18 MED ORDER — ALBUTEROL SULFATE HFA 108 (90 BASE) MCG/ACT IN AERS: 2.0000 | INHALATION_SPRAY | RESPIRATORY_TRACT | Status: DC | PRN

## 2021-07-18 MED ORDER — SODIUM CHLORIDE 0.9 % IV SOLN
500.0000 mg | Freq: Once | INTRAVENOUS | Status: AC
  Administered 2021-07-18: 500 mg via INTRAVENOUS
  Filled 2021-07-18: qty 500

## 2021-07-18 MED ORDER — SODIUM CHLORIDE 0.9 % IV SOLN
2.0000 g | Freq: Once | INTRAVENOUS | Status: AC
  Administered 2021-07-18: 2 g via INTRAVENOUS
  Filled 2021-07-18: qty 2

## 2021-07-18 NOTE — ED Triage Notes (Signed)
Pt BIB EMS from [facility]  for cough with increased O2 requirement, was satting in the 80's per report on 3L . EMS reports 92% 5L. Pt has hx COPD, was dx yesterday with PNA, on PO cefdinir 300 mg. Pt AOx4, tachypnic on arrival.

## 2021-07-18 NOTE — Progress Notes (Signed)
Pharmacy Antibiotic Note  [name] is a 85 y.o. male admitted on 08/09/2021 with pneumonia. Noted to have rash with keflex, but has since tolerated cephalosporins, including ceftriaxone on last admit and cefdinir as an outpatient. Recent hospitalization and discharged on 07/06/21. Pharmacy has been consulted for Cefepime dosing.  Plan: Cefepime 2g q24h F/u renal function and adjust as needed  Height: 5\' 6"  (167.6 cm) Weight: 65.8 kg (145 lb) IBW/kg (Calculated) : 63.8  Temp (24hrs), Avg:98 F (36.7 C), Min:97.3 F (36.3 C), Max:98.9 F (37.2 C)  Recent Labs  Lab 07/15/21 0000 08/02/2021 2032 08/13/2021 2208  WBC 9.4 13.2*  --   CREATININE 2.2* 2.80*  --   LATICACIDVEN  --   --  1.4    Estimated Creatinine Clearance: 18.4 mL/min (A) (by C-G formula based on SCr of 2.8 mg/dL (H)).    Allergies  Allergen Reactions   Keflex [Cephalexin] Rash    Rash over whole body    Antimicrobials this admission: Cefepime 11/3 >  Microbiology results: 11/3 BCx: sent  Thank you for allowing pharmacy to be a part of this patient's care.  Levonne Spiller 07/27/2021 11:05 PM

## 2021-07-18 NOTE — Progress Notes (Addendum)
Location:  [facility] Room Number: 824-M Place of Service:  ALF [phone]) Provider:  Royal Hawthorn, NP   Patient Care Team: [name], MD as PCP - General (Internal Medicine) Gayland Curry, DO as PCP - Internal Medicine (Geriatric Medicine) Bensimhon, Shaune Pascal, MD as PCP - Advanced Heart Failure (Cardiology) Royal Hawthorn, NP as Nurse Practitioner (Nurse Practitioner)  Extended Emergency Contact Information Primary Emergency Contact: Tarnow,Roger Address: [address]          [place] [address] [name] Phone: [phone] Mobile Phone: [phone] Relation: Brother  Code Status:  DNR Goals of care: Advanced Directive information Advanced Directives 08/08/2021  Does Patient Have a Medical Advance Directive? Yes  Type of Advance Directive Out of facility DNR (pink MOST or yellow form);Healthcare Power of Attorney  Does patient want to make changes to medical advance directive? No - Patient declined  Copy of [place] in Chart? Yes - validated most recent copy scanned in chart (See row information)  Would patient like information on creating a medical advance directive? -  Pre-existing out of facility DNR order (yellow form or pink MOST form) Yellow form placed in chart (order not valid for inpatient use)     Chief Complaint  Patient presents with   Acute Visit    Follow-up on pneumonia     HPI:  Pt is a 85 y.o. male seen today for an acute visit for sob/pna.  PMH chronic kidney disease stage IV, systolic congestive heart failure (Echo 07/2019 EF as low as 15-20%, last echo 07/07/21 30-35%), atrial flutter (S/P DCCV 12/2010), hypertension, hypothyroidism, left bundle branch block and chronic kidney disease stage IV, cognitive delay.  He is s/p admission from 07/06/21-07/09/21 for sob, found to be in systolic CHF And treated with IV diuretics.  He discharged from the hospital to rehab at [facility]. He  progressed nicely and was off oxygen and independent in ADLs and sent back to AL on 11/1.   On 11/2 he was having low oxygen in the 80's sob with rest and exertion and had gained 2.8 lbs. He was given lasix extra 40 mg for three days. CXR 11/2 showed pulmonary infiltrate in the right lung apex and bilateral lung bases and a small right pleural effusion findings c/w PNA  vs CHF. He was started on levaquin for 5 days. Sats are 94% ib 2 L. Pt continues with sob at rest and with exertion. No chest pain. No fever, cough, or sputum production. He is weaker and requiring assistance with all ADLs. His wife is concerned she will trip on his oxygen cord.   10/31 134.4lbs 11/1 132 lbs.  Past Medical History:  Diagnosis Date   Arthritis    Atrial fibrillation (HCC)    Atrial flutter (HCC)    Cardiomyopathy    Nonischemic, LVEF 20%   CHF (congestive heart failure) (HCC)    Chronic renal insufficiency    COPD (chronic obstructive pulmonary disease) ([facility])    Fracture of unspecified part of left clavicle, initial encounter for closed fracture    Hypothyroidism    Left ankle sprain    Left bundle branch block    Mixed hyperlipidemia    Renal failure    Scoliosis    Past Surgical History:  Procedure Laterality Date   EYE SURGERY     LAPAROSCOPIC CHOLECYSTECTOMY  2008   left foot surgery  1998   MASS EXCISION Left 06/26/2016   Procedure: EXCISION Mucoid  tumor;  Surgeon: Daryll Brod, MD;  Location: [facility];  Service: Orthopedics;  Laterality: Left;  FAB   Right inguinal herniorrhaphy     ROTATION FLAP Left 06/26/2016   Procedure: debridement of distal interphalangeal possible, ROTATION FLAP left index finger;  Surgeon: Daryll Brod, MD;  Location: [facility];  Service: Orthopedics;  Laterality: Left;   SKIN CANCER REMOVED  LEFT EAR     TONSILLECTOMY      No Known Allergies  Outpatient Encounter Medications as of 07/23/2021  Medication Sig   amiodarone (PACERONE) 200 MG tablet Take 200 mg by mouth daily.    ammonium lactate (AMLACTIN) 12 % cream Apply 1 application topically as needed for dry skin.   apixaban (ELIQUIS) 2.5 MG TABS tablet Take 1 tablet (2.5 mg total) by mouth 2 (two) times daily. Please cancel all previous orders for current medication. Change in dosage or pill size.   aspirin EC 81 MG tablet Take 81 mg by mouth daily.   carbamide peroxide (DEBROX) 6.5 % OTIC solution Place 5 drops into the right ear every evening.   furosemide (LASIX) 40 MG tablet Take 40 mg by mouth daily.   hydrALAZINE (APRESOLINE) 25 MG tablet TAKE 1 TABLET BY MOUTH THREE TIMES DAILY   hydrocortisone ointment 0.5 % Apply 1 application topically 2 (two) times daily as needed for itching. Right thumb   hydrocortisone valerate cream (WESTCORT) 0.2 % Apply 1 application topically 2 (two) times daily. To affected areas of ear, scalp, hair, and nostrils/nose   isosorbide mononitrate (IMDUR) 30 MG 24 hr tablet Take 30 mg by mouth daily.   ketoconazole (NIZORAL) 2 % cream Apply 1 application topically 2 (two) times daily. To affected areas of ear, scalp, hair, and nostrils/nose   ketoconazole (NIZORAL) 2 % shampoo Apply 1 application topically 2 (two) times a week. With hair washing on Wednesday's and Saturday's   levothyroxine (SYNTHROID) 150 MCG tablet Take 150 mcg by mouth daily before breakfast.   loratadine (CLARITIN) 10 MG tablet Take 10 mg by mouth daily.   OXYGEN Inhale 2 L into the lungs as directed. Every Shift; Shift 1, Shift 2, Shift 3   polyethylene glycol (MIRALAX / GLYCOLAX) 17 g packet Take 17 g by mouth daily as needed for mild constipation.   spironolactone (ALDACTONE) 25 MG tablet TAKE ONE TABLET BY MOUTH ONCE DAILY   No facility-administered encounter medications on file as of 08/02/2021.    Review of Systems  Constitutional:  Negative for activity change, appetite change, chills, diaphoresis, fatigue, fever and unexpected weight change.  HENT:  Negative for congestion.   Respiratory:  Positive  for shortness of breath. Negative for cough, wheezing and stridor.   Cardiovascular:  Positive for leg swelling. Negative for chest pain and palpitations.  Gastrointestinal:  Negative for abdominal distention, abdominal pain, constipation and diarrhea.  Genitourinary:  Negative for difficulty urinating and dysuria.  Musculoskeletal:  Positive for gait problem. Negative for arthralgias, back pain, joint swelling and myalgias.  Neurological:  Negative for dizziness, seizures, syncope, facial asymmetry, speech difficulty, weakness and headaches.  Hematological:  Negative for adenopathy. Does not bruise/bleed easily.  Psychiatric/Behavioral:  Negative for agitation, behavioral problems and confusion.    Immunization History  Administered Date(s) Administered   Fluad Quad(high Dose 65+) 06/19/2021   Influenza Inj Mdck Quad Pf 07/04/2016   Influenza Inj Mdck Quad With Preservative 06/18/2017   Influenza Split 06/02/2014, 06/02/2014, 06/14/2015   Influenza, High Dose Seasonal PF 07/14/2019, 07/06/2020   Influenza,inj,Quad PF,6+ Mos  07/15/2018   Moderna SARS-COV2 Booster Vaccination 06/26/2021   Moderna Sars-Covid-2 Vaccination 09/25/2019, 10/25/2019, 07/28/2020   Pneumococcal Conjugate-13 02/07/2016   Pneumococcal Polysaccharide-23 03/09/2018   Tdap 03/06/2018   Zoster Recombinat (Shingrix) 03/26/2018, 03/26/2021   Pertinent  Health Maintenance Due  Topic Date Due   INFLUENZA VACCINE  Completed   Fall Risk 07/07/2021 07/07/2021 07/08/2021 07/08/2021 07/09/2021  Falls in the past year? - - - - -  Was there an injury with Fall? - - - - -  Was there an injury with Fall? - - - - -  Fall Risk Category Calculator - - - - -  Fall Risk Category - - - - -  Patient Fall Risk Level Moderate fall risk Moderate fall risk Moderate fall risk Moderate fall risk Moderate fall risk  Patient at Risk for Falls Due to - - - - -  Fall risk Follow up - - - - -   Functional Status Survey:    Vitals:    08/04/2021 0948  BP: (!) 115/55  Pulse: 61  Resp: (!) 24  Temp: (!) 97.3 F (36.3 C)  SpO2: 94%  Weight: 132 lb (59.9 kg)  Height: 5\' 4"  (1.626 m)   Body mass index is 22.66 kg/m. Physical Exam Vitals reviewed.  HENT:     Nose: Nose normal. No congestion.     Mouth/Throat:     Mouth: Mucous membranes are moist.     Pharynx: Oropharynx is clear.  Cardiovascular:     Rate and Rhythm: Normal rate and regular rhythm.  Pulmonary:     Comments: Increased WOB at rest. Decreased bases. Rhonchi to RUL cleared with cough. Rales to right base Abdominal:     General: Bowel sounds are normal.     Palpations: Abdomen is soft.  Musculoskeletal:     Comments: BLE edema +1 Kyphosis  Skin:    General: Skin is warm and dry.  Neurological:     Mental Status: He is oriented to person, place, and time.  Psychiatric:        Mood and Affect: Mood normal.    Labs reviewed: Recent Labs    07/07/21 0425 07/08/21 0357 07/09/21 0341  NA 133* 134* 134*  K 4.8 4.5 4.3  CL 103 100 97*  CO2 22 26 27   GLUCOSE 118* 100* 98  BUN 34* 40* 44*  CREATININE 2.05* 2.71* 2.25*  CALCIUM 7.8* 7.9* 7.9*  MG 1.9  --   --    Recent Labs    01/03/21 0000 07/06/21 2344 07/07/21 0425  AST 22 39 36  ALT 15 28 30   ALKPHOS 83 89 85  BILITOT  --  0.4 0.6  PROT  --  6.5 6.4*  ALBUMIN 4.0 2.3* 2.2*   Recent Labs    07/06/21 2344 07/07/21 0050 07/07/21 0425 07/08/21 0357  WBC 10.5  --  12.2* 10.3  NEUTROABS 8.3*  --  9.7*  --   HGB 10.3* 9.2* 11.0* 10.3*  HCT 32.6* 27.0* 34.1* 32.1*  MCV 100.6*  --  100.0 97.9  PLT 334  --  341 336   Lab Results  Component Value Date   TSH 1.29 02/28/2021   Lab Results  Component Value Date   HGBA1C 5.5 10/18/2017   Lab Results  Component Value Date   CHOL 171 01/03/2021   HDL 59 01/03/2021   LDLCALC 92 01/03/2021   TRIG 102 01/03/2021   CHOLHDL 3.4 10/18/2017    Significant Diagnostic Results in last 30  days:  DG Chest Port 1 View  Result Date:  07/07/2021 CLINICAL DATA:  Weakness and shortness of breath. EXAM: PORTABLE CHEST 1 VIEW COMPARISON:  October 22, 2017 FINDINGS: The lungs are hyperinflated. Diffuse, chronic appearing increased interstitial lung markings are seen. Mild areas of atelectasis and/or infiltrate are seen involving the mid left lung, bilateral lung bases and right upper lobe. There is no evidence of a pleural effusion or pneumothorax. Evaluation of the superior mediastinum is limited secondary to patient positioning. The cardiac silhouette is moderately enlarged. Radiopaque surgical clips are seen within the left upper quadrant. Degenerative changes seen throughout the thoracic spine. IMPRESSION: Chronic appearing increased interstitial lung markings with mild areas of bilateral atelectasis and/or infiltrate. Electronically Signed   By: Virgina Norfolk M.D.   On: 07/07/2021 00:11   ECHOCARDIOGRAM COMPLETE  Result Date: 07/07/2021    ECHOCARDIOGRAM REPORT   Patient Name:   [name] Date of Exam: 07/07/2021 Medical Rec #:  [id number]        Height:       64.0 in Accession #:    [id number]       Weight:       145.0 lb Date of Birth:  March 25, 1936       BSA:          1.706 m Patient Age:    85 years         BP:           107/55 mmHg Patient Gender: M                HR:           72 bpm. Exam Location:  Inpatient Procedure: 2D Echo, Cardiac Doppler and Color Doppler Indications:    CHF  History:        Patient has prior history of Echocardiogram examinations, most                 recent 07/28/2019. Arrythmias:LBBB; Risk Factors:Dyslipidemia.  Sonographer:    Maudry Mayhew [place], RVT, RDCS Referring Phys: [id number] [facility]  Sonographer Comments: No subcostal window. IMPRESSIONS  1. Left ventricular ejection fraction, by estimation, is 30 to 35%. The left ventricle has moderately decreased function. The left ventricle demonstrates regional wall motion abnormalities (see scoring diagram/findings for description). The  left ventricular internal cavity size was mildly dilated. Left ventricular diastolic parameters are consistent with Grade II diastolic dysfunction (pseudonormalization). Elevated left ventricular end-diastolic pressure.  2. Right ventricular systolic function is mildly reduced. The right ventricular size is normal.  3. Left atrial size was moderately dilated.  4. Right atrial size was mild to moderately dilated.  5. The mitral valve is normal in structure. Mild mitral valve regurgitation. No evidence of mitral stenosis.  6. The aortic valve was not well visualized. Aortic valve regurgitation is mild to moderate. Comparison(s): Changes from prior study are noted. Conclusion(s)/Recommendation(s): EF reduced compared to prior. Global hypokinesis with akinesis/dyskinesis of the entire septum, out of proportion to dyssychrony from LBBB. FINDINGS  Left Ventricle: Left ventricular ejection fraction, by estimation, is 30 to 35%. The left ventricle has moderately decreased function. The left ventricle demonstrates regional wall motion abnormalities. The left ventricular internal cavity size was mildly dilated. There is no left ventricular hypertrophy. Abnormal (paradoxical) septal motion, consistent with left bundle branch block. Left ventricular diastolic parameters are consistent with Grade II diastolic dysfunction (pseudonormalization). Elevated left ventricular end-diastolic pressure.  LV Wall Scoring: The anterior septum is dyskinetic. The  inferior septum is akinetic. The entire anterior wall, entire lateral wall, entire inferior wall, and apex are hypokinetic. Right Ventricle: The right ventricular size is normal. Right vetricular wall thickness was not well visualized. Right ventricular systolic function is mildly reduced. Left Atrium: Left atrial size was moderately dilated. Right Atrium: Right atrial size was mild to moderately dilated. Pericardium: There is no evidence of pericardial effusion. Mitral Valve: The  mitral valve is normal in structure. Mild mitral valve regurgitation. No evidence of mitral valve stenosis. Tricuspid Valve: The tricuspid valve is normal in structure. Tricuspid valve regurgitation is trivial. No evidence of tricuspid stenosis. Aortic Valve: The aortic valve was not well visualized. Aortic valve regurgitation is mild to moderate. Aortic regurgitation PHT measures 422 msec. Aortic valve mean gradient measures 4.0 mmHg. Aortic valve peak gradient measures 7.0 mmHg. Aortic valve area, by VTI measures 1.56 cm. Pulmonic Valve: The pulmonic valve was not well visualized. Pulmonic valve regurgitation is not visualized. Aorta: The aortic root is normal in size and structure, the aortic arch was not well visualized and the ascending aorta was not well visualized. IAS/Shunts: The atrial septum is grossly normal.  LEFT VENTRICLE PLAX 2D LVIDd:         5.75 cm   Diastology LVIDs:         4.90 cm   LV e' medial:    4.12 cm/s LV PW:         0.70 cm   LV E/e' medial:  30.3 LV IVS:        0.60 cm   LV e' lateral:   8.93 cm/s LVOT diam:     1.70 cm   LV E/e' lateral: 14.0 LV SV:         44 LV SV Index:   26 LVOT Area:     2.27 cm  RIGHT VENTRICLE TAPSE (M-mode): 1.5 cm LEFT ATRIUM             Index        RIGHT ATRIUM           Index LA diam:        3.50 cm 2.05 cm/m   RA Area:     17.20 cm LA Vol (A2C):   48.9 ml 28.66 ml/m  RA Volume:   48.10 ml  28.19 ml/m LA Vol (A4C):   73.1 ml 42.84 ml/m LA Biplane Vol: 59.9 ml 35.10 ml/m  AORTIC VALVE AV Area (Vmax):    1.61 cm AV Area (Vmean):   1.57 cm AV Area (VTI):     1.56 cm AV Vmax:           132.00 cm/s AV Vmean:          85.500 cm/s AV VTI:            0.280 m AV Peak Grad:      7.0 mmHg AV Mean Grad:      4.0 mmHg LVOT Vmax:         93.80 cm/s LVOT Vmean:        59.100 cm/s LVOT VTI:          0.192 m LVOT/AV VTI ratio: 0.69 AI PHT:            422 msec  AORTA Ao Root diam: 3.10 cm MITRAL VALVE                TRICUSPID VALVE MV Area (PHT): 2.07 cm     TR  Peak grad:  12.4 mmHg MV Decel Time: 366 msec     TR Vmax:        176.00 cm/s MV E velocity: 124.67 cm/s MV A velocity: 93.60 cm/s   SHUNTS MV E/A ratio:  1.33         Systemic VTI:  0.19 m                             Systemic Diam: 1.70 cm Buford Dresser MD Electronically signed by Buford Dresser MD Signature Date/Time: 07/07/2021/2:22:20 PM    Final     Assessment/Plan 1. Do not resuscitate Order updated. Pt confirms that he is a DNR but would like to go back to the hospital if he worsens or does not improve.   2. HCAP The area of concern in the RUL and both bases was present during his hospitalization but does appear worse when comparing films from [facility].   D/C Levaquin due to QT interval Doxycycline 100 mg bid x 7 days Pt has reported allergy to cephalosporins not previously documented. Will d/c omnicef  3. Acute CHF Despite losing 2 lbs he remains sob at rest. Will continue with increased lasix dosing which is 80 mg daily for a total of 3 days Daily weights NAS diet 1500 c FR  4. Acute hypoxic respiratory failure Increased oxygen to 3 liters due to sob Recommend move back to rehab for further monitoring Pt will f/u with cardiology in am Oxygen sats in the 90s, if worsening send to ED.      Family/ staff Communication: discussed with nursing staff and resident.   Labs/tests ordered:  CBC BMP BNP pending.

## 2021-07-18 NOTE — Progress Notes (Incomplete)
ADVANCED HEART FAILURE CLINIC  Patient ID: [name], male   DOB: [birth year], 85 y.o.   MRN: [id number]   ADVANCED HEART FAILURE NOTE PCP: Dr Willey Blade  HPI: [name] is a 85 y.o. male with h/o cognitive impairment, renal insufficiency (baseline about 1.5-1.6), severe CHF due to NICM (EF 15-20%), atrial flutter (s/p DC-CV in April 2012) and LBBB. He and his family are not interested in ICD.   12/2010: Coronary angiograms showed insignifcant CAD.  12/2010: TEE-guided cardioversion EF 15-20%.  He was successfully cardioverted with 1 shock.  He was started on amiodarone to help maintain sinus rhythm.Given his LBBB we discussed the possibility of BiV-ICD but decided to defer as he was doing so well.  He returns today. Has been at [facility] Says he is doing well. Denies CP or SOB. No orthopnea or PND. Volume status well controlled with compression hose. No syncope/presyncope  Admitted 10/22 for A/C CHF. Diuresed with IV lasix. AHF consulted, Echo showed EF worse at 30-35%. Cath not pursued due to CKD and no chest pain. Hospitalization c/b AKI on CKD and GDMT held. Arlyce Harman and lasix added back, losartan stopped. He was discharged back to [facility] on 07/16/21, weight 132 lbs.  He was more SOB 11/2 and O2 80's, and weight up 2.8 lbs. He was given extra 40 mg Lasix. CXR with infiltrate + small effusion c/w PNA vs CHF. He was started on Levaquin x 5 days and moved back to rehab section of ALF.  Today he returns for post hospitalization HF follow up. Overall feeling fine. Denies increasing SOB, CP, dizziness, edema, or PND/Orthopnea. Appetite ok. No fever or chills. Weight at home 170 pounds. Taking all medications.        Echo (6/12): with EF 70-26%, Grade 1 diastolic dysfunction, ventricular septum dyssynergy.  Mildly reduced RV   Echo (10/13): EF 25-30% Echo (3/17):  EF 35%.  Echo 11/20 EF 40-45%. AI appears mild to moderate visually. Pressure half time seems at least  moderate Echo (10/22): EF 30-35%, RV mildly reduced, Grade II DD, moderate AS.   ROS: All systems negative except as listed in HPI, PMH and Problem List.  Past Medical History:  Diagnosis Date   Arthritis    Atrial fibrillation (HCC)    Atrial flutter (HCC)    Cardiomyopathy    Nonischemic, LVEF 20%   CHF (congestive heart failure) (HCC)    Chronic renal insufficiency    COPD (chronic obstructive pulmonary disease) (HCC)    Fracture of unspecified part of left clavicle, initial encounter for closed fracture    Hypothyroidism    Left ankle sprain    Left bundle branch block    Mixed hyperlipidemia    Renal failure    Scoliosis     Current Outpatient Medications  Medication Sig Dispense Refill   amiodarone (PACERONE) 200 MG tablet Take 200 mg by mouth daily.     ammonium lactate (AMLACTIN) 12 % cream Apply 1 application topically as needed for dry skin.     apixaban (ELIQUIS) 2.5 MG TABS tablet Take 1 tablet (2.5 mg total) by mouth 2 (two) times daily. Please cancel all previous orders for current medication. Change in dosage or pill size. 30 tablet 6   aspirin EC 81 MG tablet Take 81 mg by mouth daily.     carbamide peroxide (DEBROX) 6.5 % OTIC solution Place 5 drops into the right ear every evening.     doxycycline (VIBRA-TABS) 100 MG tablet Take 1 tablet (100  mg total) by mouth 2 (two) times daily. 14 tablet 0   furosemide (LASIX) 40 MG tablet Take 40 mg by mouth daily.     furosemide (LASIX) 40 MG tablet Take 1 tablet (40 mg total) by mouth daily. 3 tablet 0   hydrALAZINE (APRESOLINE) 25 MG tablet TAKE 1 TABLET BY MOUTH THREE TIMES DAILY 90 tablet 11   hydrocortisone ointment 0.5 % Apply 1 application topically 2 (two) times daily as needed for itching. Right thumb     hydrocortisone valerate cream (WESTCORT) 0.2 % Apply 1 application topically 2 (two) times daily. To affected areas of ear, scalp, hair, and nostrils/nose     isosorbide mononitrate (IMDUR) 30 MG 24 hr tablet  Take 30 mg by mouth daily.     ketoconazole (NIZORAL) 2 % cream Apply 1 application topically 2 (two) times daily. To affected areas of ear, scalp, hair, and nostrils/nose     ketoconazole (NIZORAL) 2 % shampoo Apply 1 application topically 2 (two) times a week. With hair washing on Wednesday's and Saturday's     levothyroxine (SYNTHROID) 150 MCG tablet Take 150 mcg by mouth daily before breakfast.     loratadine (CLARITIN) 10 MG tablet Take 10 mg by mouth daily.     OXYGEN Inhale 2 L into the lungs as directed. Every Shift; Shift 1, Shift 2, Shift 3     polyethylene glycol (MIRALAX / GLYCOLAX) 17 g packet Take 17 g by mouth daily as needed for mild constipation.     spironolactone (ALDACTONE) 25 MG tablet TAKE ONE TABLET BY MOUTH ONCE DAILY 90 tablet 3   No current facility-administered medications for this visit.     PHYSICAL EXAM: There were no vitals filed for this visit.   General:  Sitting in chair. No resp difficulty. + speech impediment HEENT: normal Neck: severe kyphosis  no JVD. Carotids 2+ bilat; no bruits. No lymphadenopathy or thryomegaly appreciated Cor: PMI nondisplaced. Regular brady  No rubs, gallops or murmurs. Lungs: clear Abdomen: soft, nontender, nondistended. No hepatosplenomegaly. No bruits or masses. Good bowel sounds. Extremities: no cyanosis, clubbing, rash, edema Neuro: alert & orientedx3, cranial nerves grossly intact. moves all 4 extremities w/o difficulty. Affect pleasant  ECG: sinus brady 49 1 AVB (255ms) LBBB 139ms Personally reviewed  ASSESSMENT & PLAN: A/C Combined Systolic Heart Failure , NICM  - Cath 2013 no CAD -Echo 2020 EF 40-45%--> this admit EF 30-35% with LBBB. He/Family refused ICD/CRT-D dating back to 20212. HS Trop no trend.  EF worse. Would not pursue cath with elevated creatinine and no chest pain.  - Well diuresed w/ IV lasix. Euvolemic to dry. ReDs clip 19 yesterday. Diuretics now on hold w/ SCr bump - Continue to hold lasix today  -  No SGLT2i with volume depletion.  - Continue to hold losartan and spiro for now  - No bb with know bradycardia.  - Continue hydralazine 25 mg tid + imdur  - Follow BMET daily    2. AKI on CKD Stage IV - Creatinine baseline 2-2.5 - Creatinine on admit 2.2-->2.7->2.3 today  - Continue to hold lasix, spiro and losartan today    3. PAF - In SR on admit.  - Continue amio 200 mg daily  - Continue eliquis 2.5 mg twice a day. Reduced dose with age and elevated creatinine    4. Hypothyroidism  - On Levothyroxine.    5. Deconditioning  - Ambulate w/ PT - Plan return to SNF    6. DNR/DNI

## 2021-07-18 NOTE — ED Notes (Signed)
Pt transported to XR.  

## 2021-07-18 NOTE — Sepsis Progress Note (Signed)
Monitoring for code sepsis protocol. 

## 2021-07-18 NOTE — ED Notes (Signed)
Pt. returned from XR. 

## 2021-07-18 NOTE — Addendum Note (Signed)
Addended by: Barnie Mort on: 08/03/2021 02:06 PM   Modules accepted: Orders

## 2021-07-18 NOTE — ED Provider Notes (Signed)
[facility] EMERGENCY DEPARTMENT Provider Note   CSN: [id number] Arrival date & time: 08/09/2021  2017     History Chief Complaint  Patient presents with   Cough   Shortness of Breath    [name] is a 85 y.o. male.  HPI    85 year old male with past medical history of chronic kidney disease stage IV, systolic congestive heart failure, atrial flutter, COPD comes in with chief complaint of cough and shortness of breath.  Patient was just discharged from the hospital to [place] home.  He was admitted to the hospital for CHF exacerbation and new oxygen requirement.  Patient now on 2 L of continuous oxygen.  According to EMS, [facility] staff noted that patient was having increased cough and requiring increased oxygen for desats in the 80s.  He was diagnosed with pneumonia yesterday and started on Omnicef.  Patient reports that he does not have any shortness of breath, but is having worsening cough.  Cough is producing clear phlegm.  He denies any body aches, fevers, chills.  Past Medical History:  Diagnosis Date   Arthritis    Atrial fibrillation (HCC)    Atrial flutter ([place])    Cardiomyopathy    Nonischemic, LVEF 20%   CHF (congestive heart failure) (HCC)    Chronic renal insufficiency    COPD (chronic obstructive pulmonary disease) ([place])    Fracture of unspecified part of left clavicle, initial encounter for closed fracture    Hypothyroidism    Left ankle sprain    Left bundle branch block    Mixed hyperlipidemia    Renal failure    Scoliosis     Patient Active Problem List   Diagnosis Date Noted   Shortness of breath 07/09/2021   Acute on chronic systolic CHF (congestive heart failure) ([place]) 07/07/2021   Acute respiratory failure with hypoxia ([place]) 07/07/2021   Essential hypertension 07/07/2021   Venous insufficiency (chronic) (peripheral) 06/16/2018   Chronic pain of left ankle 06/16/2018   Right shoulder pain 10/17/2017    CKD (chronic kidney disease) stage 4, GFR 15-29 ml/min (HCC) 10/17/2017   Closed fracture of left Clavicle 08/19/17 09/24/2017   COPD (chronic obstructive pulmonary disease) (HCC)    Hematuria, microscopic    Weakness 04/24/2017   Mucoid cyst, joint 05/12/2016   Primary osteoarthritis of first carpometacarpal joint of left hand 02/18/2016   Acquired hypothyroidism 05/19/2012   Benign fibroma of prostate 05/19/2012   Block, bundle branch, left 05/19/2012   Long term (current) use of anticoagulants 01/31/2011   Rash 01/13/2011   Paroxysmal atrial flutter ([place]) 81/85/6314   Chronic systolic heart failure ([place]) 12/02/2010   Mixed hyperlipidemia 11/08/2010   Secondary cardiomyopathy ([place]) 11/08/2010   Left bundle branch block (LBBB) determined by electrocardiography 11/08/2010    Past Surgical History:  Procedure Laterality Date   EYE SURGERY     LAPAROSCOPIC CHOLECYSTECTOMY  2008   left foot surgery  1998   MASS EXCISION Left 06/26/2016   Procedure: EXCISION Mucoid tumor;  Surgeon: Daryll Brod, MD;  Location: [facility];  Service: Orthopedics;  Laterality: Left;  FAB   Right inguinal herniorrhaphy     ROTATION FLAP Left 06/26/2016   Procedure: debridement of distal interphalangeal possible, ROTATION FLAP left index finger;  Surgeon: Daryll Brod, MD;  Location: [facility];  Service: Orthopedics;  Laterality: Left;   SKIN CANCER REMOVED  LEFT EAR     TONSILLECTOMY         Family History  Problem Relation Age of Onset   Heart disease Father        Died age 9   Stroke Mother        Died age 6   Diabetes Mother    Pancreatic cancer Brother    Arthritis Brother    Throat cancer Brother    Arthritis Brother    Diabetes Sister    Seizures Sister     Social History   Tobacco Use   Smoking status: Never   Smokeless tobacco: Never  Vaping Use   Vaping Use: Never used  Substance Use Topics   Alcohol use: Not Currently   Drug use: No    Home Medications Prior to Admission  medications   Medication Sig Start Date End Date Taking? Authorizing Provider  amiodarone (PACERONE) 200 MG tablet Take 200 mg by mouth daily.    [provider]  ammonium lactate (AMLACTIN) 12 % cream Apply 1 application topically as needed for dry skin.    [provider]  apixaban (ELIQUIS) 2.5 MG TABS tablet Take 1 tablet (2.5 mg total) by mouth 2 (two) times daily. Please cancel all previous orders for current medication. Change in dosage or pill size. 11/09/18   Bensimhon, Shaune Pascal, MD  aspirin EC 81 MG tablet Take 81 mg by mouth daily.    [provider]  carbamide peroxide (DEBROX) 6.5 % OTIC solution Place 5 drops into the right ear every evening.    [provider]  doxycycline (VIBRA-TABS) 100 MG tablet Take 1 tablet (100 mg total) by mouth 2 (two) times daily. 08/07/2021   Royal Hawthorn, NP  furosemide (LASIX) 40 MG tablet Take 40 mg by mouth daily.    [provider]  furosemide (LASIX) 40 MG tablet Take 1 tablet (40 mg total) by mouth daily. 07/28/2021   Royal Hawthorn, NP  hydrALAZINE (APRESOLINE) 25 MG tablet TAKE 1 TABLET BY MOUTH THREE TIMES DAILY 12/23/17   Bensimhon, Shaune Pascal, MD  hydrocortisone ointment 0.5 % Apply 1 application topically 2 (two) times daily as needed for itching. Right thumb    [provider]  hydrocortisone valerate cream (WESTCORT) 0.2 % Apply 1 application topically 2 (two) times daily. To affected areas of ear, scalp, hair, and nostrils/nose    [provider]  isosorbide mononitrate (IMDUR) 30 MG 24 hr tablet Take 30 mg by mouth daily.    [provider]  ketoconazole (NIZORAL) 2 % cream Apply 1 application topically 2 (two) times daily. To affected areas of ear, scalp, hair, and nostrils/nose    [provider]  ketoconazole (NIZORAL) 2 % shampoo Apply 1 application topically 2 (two) times a week. With hair washing on Wednesday's and Saturday's    [provider]   levothyroxine (SYNTHROID) 150 MCG tablet Take 150 mcg by mouth daily before breakfast.    [provider]  loratadine (CLARITIN) 10 MG tablet Take 10 mg by mouth daily.    [provider]  OXYGEN Inhale 2 L into the lungs as directed. Every Shift; Shift 1, Shift 2, Shift 3    [provider]  polyethylene glycol (MIRALAX / GLYCOLAX) 17 g packet Take 17 g by mouth daily as needed for mild constipation.    [provider]  spironolactone (ALDACTONE) 25 MG tablet TAKE ONE TABLET BY MOUTH ONCE DAILY 02/03/17   Bensimhon, Shaune Pascal, MD    Allergies    Keflex [cephalexin]  Review of Systems   Review of Systems  Constitutional:  Positive for activity change.  Respiratory:  Positive for cough.   All other systems reviewed and are negative.  Physical Exam Updated Vital Signs BP (!) 110/55   Pulse (!) 58   Temp 97.8 F (36.6 C) (Oral)   Resp 20   Ht 5\' 6"  (1.676 m)   Wt 65.8 kg   SpO2 96%   BMI 23.40 kg/m   Physical Exam Vitals and nursing note reviewed.  Constitutional:      Appearance: He is well-developed.  HENT:     Head: Atraumatic.  Cardiovascular:     Rate and Rhythm: Normal rate.  Pulmonary:     Effort: Pulmonary effort is normal.  Musculoskeletal:     Cervical back: Neck supple.     Right lower leg: No tenderness. Edema present.     Left lower leg: No tenderness. Edema present.  Skin:    General: Skin is warm.  Neurological:     Mental Status: He is alert and oriented to person, place, and time.    ED Results / Procedures / Treatments   Labs (all labs ordered are listed, but only abnormal results are displayed) Labs Reviewed  BASIC METABOLIC PANEL - Abnormal; Notable for the following components:      Result Value   Sodium 131 (*)    CO2 20 (*)    Glucose, Bld 161 (*)    BUN 54 (*)    Creatinine, Ser 2.80 (*)    Calcium 7.9 (*)    GFR, Estimated 22 (*)    All other components within normal limits  CBC - Abnormal;  Notable for the following components:   WBC 13.2 (*)    RBC 3.27 (*)    Hemoglobin 10.3 (*)    HCT 31.8 (*)    All other components within normal limits  RESP PANEL BY RT-PCR (FLU A&B, COVID) ARPGX2  CULTURE, BLOOD (ROUTINE X 2)  CULTURE, BLOOD (ROUTINE X 2)  LACTIC ACID, PLASMA  LACTIC ACID, PLASMA  URINALYSIS, ROUTINE W REFLEX MICROSCOPIC    EKG EKG Interpretation  Date/Time:  Thursday July 18 2021 20:49:36 EDT Ventricular Rate:  64 PR Interval:  206 QRS Duration: 160 QT Interval:  482 QTC Calculation: 497 R Axis:   122 Text Interpretation: Normal sinus rhythm Non-specific intra-ventricular conduction block Left ventricular hypertrophy with repolarization abnormality ( Sokolow-Lyon , Romhilt-Estes ) Abnormal ECG No acute changes diffuse TWI are not new No significant change since last tracing Confirmed by Varney Biles [phone]) on 07/22/2021 10:49:23 PM  Radiology DG Chest 2 View  Result Date: 08/05/2021 CLINICAL DATA:  Cough, progressive hypoxia EXAM: CHEST - 2 VIEW COMPARISON:  07/06/2021, 10/17/2017 FINDINGS: Lung volumes are small and pulmonary insufflation has decreased in the interval since prior examination. Superimposed multifocal pulmonary infiltrate, more focal within the right upper lobe and lung bases bilaterally appears slightly progressive at the lung bases. No pneumothorax. Small bilateral pleural effusions. Cardiomegaly is stable. No acute bone abnormality. IMPRESSION: Progressive multifocal infiltrate possibly reflecting changes of multifocal pneumonia. Small bilateral pleural effusions. Progressive pulmonary hypoinflation. Stable cardiomegaly Electronically Signed   By: Fidela Salisbury M.D.   On: 08/03/2021 21:27    Procedures .Critical Care E&M Performed by: Varney Biles, MD  Critical care provider statement:    Critical care time (minutes):  40   Critical care was necessary to treat or prevent imminent or life-threatening deterioration of the following  conditions:  Respiratory failure   Critical care was time spent personally by me  on the following activities:  Development of treatment plan with patient or surrogate, discussions with consultants, evaluation of patient's response to treatment, examination of patient, interpretation of cardiac output measurements, obtaining history from patient or surrogate, ordering and performing treatments and interventions, ordering and review of laboratory studies, ordering and review of radiographic studies, pulse oximetry, re-evaluation of patient's condition and review of old charts After initial E/M assessment, critical care services were subsequently performed that were exclusive of separately billable procedures or treatment.     Medications Ordered in ED Medications  albuterol (VENTOLIN HFA) 108 (90 Base) MCG/ACT inhaler 2 puff (has no administration in time range)  lactated ringers infusion (has no administration in time range)  sodium chloride 0.9 % bolus 500 mL (500 mLs Intravenous New Bag/Given 08/14/2021 2248)  ceFEPIme (MAXIPIME) 2 g in sodium chloride 0.9 % 100 mL IVPB (has no administration in time range)  azithromycin (ZITHROMAX) 500 mg in sodium chloride 0.9 % 250 mL IVPB (has no administration in time range)    ED Course  I have reviewed the triage vital signs and the nursing notes.  Pertinent labs & imaging results that were available during my care of the patient were reviewed by me and considered in my medical decision making (see chart for details).    MDM Rules/Calculators/A&P                           Patient comes in with chief complaint of cough and shortness of breath. He denies any complaints besides cough.  He has history of advanced CHF and was recently admitted for it, at the time of discharge he was on 2 L of oxygen.  At this time patient has a worsening productive cough.  He was started on antibiotics yesterday at the nursing facility with a presumptive diagnosis of  pneumonia.  On exam patient does have diffuse rhonchorous breath sounds.  X-rays reviewed -he is noted to have multifocal pneumonia.  No fevers here.  Initial thought was that patient might have COVID-19 or influenza 8.  Those tests have resulted and are negative.  We will start him on cefepime and azithromycin. Given the acute on chronic respiratory failure, we will admit him to the hospital as well.  Final Clinical Impression(s) / ED Diagnoses Final diagnoses:  Acute on chronic respiratory failure with hypoxia [facility])  Multifocal pneumonia    Rx / DC Orders ED Discharge Orders     None        Varney Biles, MD 07/16/2021 2251

## 2021-07-19 ENCOUNTER — Encounter (HOSPITAL_COMMUNITY): Payer: Self-pay | Admitting: Family Medicine

## 2021-07-19 ENCOUNTER — Encounter (HOSPITAL_COMMUNITY): Payer: Medicare HMO

## 2021-07-19 DIAGNOSIS — N184 Chronic kidney disease, stage 4 (severe): Secondary | ICD-10-CM

## 2021-07-19 DIAGNOSIS — N179 Acute kidney failure, unspecified: Secondary | ICD-10-CM

## 2021-07-19 DIAGNOSIS — I5022 Chronic systolic (congestive) heart failure: Secondary | ICD-10-CM

## 2021-07-19 DIAGNOSIS — I5043 Acute on chronic combined systolic (congestive) and diastolic (congestive) heart failure: Secondary | ICD-10-CM

## 2021-07-19 DIAGNOSIS — J449 Chronic obstructive pulmonary disease, unspecified: Secondary | ICD-10-CM

## 2021-07-19 DIAGNOSIS — I4892 Unspecified atrial flutter: Secondary | ICD-10-CM

## 2021-07-19 DIAGNOSIS — L8990 Pressure ulcer of unspecified site, unspecified stage: Secondary | ICD-10-CM | POA: Insufficient documentation

## 2021-07-19 DIAGNOSIS — J9621 Acute and chronic respiratory failure with hypoxia: Secondary | ICD-10-CM | POA: Diagnosis not present

## 2021-07-19 DIAGNOSIS — J189 Pneumonia, unspecified organism: Secondary | ICD-10-CM | POA: Diagnosis not present

## 2021-07-19 DIAGNOSIS — J9601 Acute respiratory failure with hypoxia: Secondary | ICD-10-CM

## 2021-07-19 LAB — CBC
HCT: 32.8 % — ABNORMAL LOW (ref 39.0–52.0)
Hemoglobin: 10 g/dL — ABNORMAL LOW (ref 13.0–17.0)
MCH: 30.4 pg (ref 26.0–34.0)
MCHC: 30.5 g/dL (ref 30.0–36.0)
MCV: 99.7 fL (ref 80.0–100.0)
Platelets: 329 10*3/uL (ref 150–400)
RBC: 3.29 MIL/uL — ABNORMAL LOW (ref 4.22–5.81)
RBC: 3.3 — AB (ref 3.87–5.11)
RDW: 13.5 % (ref 11.5–15.5)
WBC: 14.2 10*3/uL — ABNORMAL HIGH (ref 4.0–10.5)
nRBC: 0 % (ref 0.0–0.2)

## 2021-07-19 LAB — CBC AND DIFFERENTIAL
HCT: 32 — AB (ref 41–53)
Hemoglobin: 10.6 — AB (ref 13.5–17.5)
Platelets: 370 (ref 150–399)
WBC: 10.7

## 2021-07-19 LAB — BASIC METABOLIC PANEL
Anion gap: 10 (ref 5–15)
BUN: 55 mg/dL — ABNORMAL HIGH (ref 8–23)
CO2: 21 mmol/L — ABNORMAL LOW (ref 22–32)
Calcium: 7.8 mg/dL — ABNORMAL LOW (ref 8.9–10.3)
Chloride: 101 mmol/L (ref 98–111)
Creatinine, Ser: 2.92 mg/dL — ABNORMAL HIGH (ref 0.61–1.24)
GFR, Estimated: 21 mL/min — ABNORMAL LOW (ref >60)
Glucose, Bld: 157 mg/dL — ABNORMAL HIGH (ref 70–99)
Potassium: 5.2 mmol/L — ABNORMAL HIGH (ref 3.5–5.1)
Sodium: 132 mmol/L — ABNORMAL LOW (ref 135–145)

## 2021-07-19 LAB — PROCALCITONIN: Procalcitonin: 0.37 ng/mL

## 2021-07-19 LAB — MRSA NEXT GEN BY PCR, NASAL: MRSA by PCR Next Gen: NOT DETECTED

## 2021-07-19 LAB — STREP PNEUMONIAE URINARY ANTIGEN: Strep Pneumo Urinary Antigen: NEGATIVE

## 2021-07-19 LAB — BRAIN NATRIURETIC PEPTIDE: B Natriuretic Peptide: 2504.3 pg/mL — ABNORMAL HIGH (ref 0.0–100.0)

## 2021-07-19 MED ORDER — ACETAMINOPHEN 650 MG RE SUPP: 650.0000 mg | Freq: Four times a day (QID) | RECTAL | Status: DC | PRN

## 2021-07-19 MED ORDER — SODIUM CHLORIDE 0.9% FLUSH
3.0000 mL | Freq: Two times a day (BID) | INTRAVENOUS | Status: DC
  Administered 2021-07-19 – 2021-07-20 (×4): 3 mL via INTRAVENOUS

## 2021-07-19 MED ORDER — ALBUTEROL SULFATE (2.5 MG/3ML) 0.083% IN NEBU
2.5000 mg | INHALATION_SOLUTION | RESPIRATORY_TRACT | Status: DC | PRN
  Administered 2021-07-19 (×2): 2.5 mg via RESPIRATORY_TRACT
  Filled 2021-07-19 (×3): qty 3

## 2021-07-19 MED ORDER — AMIODARONE HCL 200 MG PO TABS
200.0000 mg | ORAL_TABLET | Freq: Every day | ORAL | Status: DC
  Administered 2021-07-19 – 2021-07-20 (×2): 200 mg via ORAL
  Filled 2021-07-19 (×3): qty 1

## 2021-07-19 MED ORDER — SPIRONOLACTONE 25 MG PO TABS
25.0000 mg | ORAL_TABLET | Freq: Every day | ORAL | Status: DC
  Filled 2021-07-19: qty 1

## 2021-07-19 MED ORDER — SODIUM ZIRCONIUM CYCLOSILICATE 5 G PO PACK
5.0000 g | PACK | Freq: Once | ORAL | Status: AC
  Administered 2021-07-19: 5 g via ORAL
  Filled 2021-07-19 (×2): qty 1

## 2021-07-19 MED ORDER — APIXABAN 2.5 MG PO TABS
2.5000 mg | ORAL_TABLET | Freq: Two times a day (BID) | ORAL | Status: DC
  Administered 2021-07-19 – 2021-07-20 (×4): 2.5 mg via ORAL
  Filled 2021-07-19 (×5): qty 1

## 2021-07-19 MED ORDER — HYDRALAZINE HCL 25 MG PO TABS: 25.0000 mg | ORAL_TABLET | Freq: Three times a day (TID) | ORAL | Status: DC

## 2021-07-19 MED ORDER — FUROSEMIDE 10 MG/ML IJ SOLN
80.0000 mg | Freq: Two times a day (BID) | INTRAMUSCULAR | Status: DC
Start: 2021-07-19 — End: 2021-07-20
  Administered 2021-07-19: 80 mg via INTRAVENOUS
  Filled 2021-07-19: qty 8

## 2021-07-19 MED ORDER — ISOSORBIDE MONONITRATE ER 30 MG PO TB24
30.0000 mg | ORAL_TABLET | Freq: Every day | ORAL | Status: DC
  Administered 2021-07-19 – 2021-07-20 (×2): 30 mg via ORAL
  Filled 2021-07-19 (×3): qty 1

## 2021-07-19 MED ORDER — FUROSEMIDE 40 MG PO TABS
40.0000 mg | ORAL_TABLET | Freq: Every day | ORAL | Status: DC
  Administered 2021-07-19: 40 mg via ORAL
  Filled 2021-07-19: qty 2

## 2021-07-19 MED ORDER — LEVOTHYROXINE SODIUM 75 MCG PO TABS
150.0000 ug | ORAL_TABLET | Freq: Every day | ORAL | Status: DC
  Administered 2021-07-19 – 2021-07-20 (×2): 150 ug via ORAL
  Filled 2021-07-19 (×2): qty 2

## 2021-07-19 MED ORDER — ACETAMINOPHEN 325 MG PO TABS: 650.0000 mg | ORAL_TABLET | Freq: Four times a day (QID) | ORAL | Status: DC | PRN

## 2021-07-19 MED ORDER — SENNOSIDES-DOCUSATE SODIUM 8.6-50 MG PO TABS
1.0000 | ORAL_TABLET | Freq: Every evening | ORAL | Status: DC | PRN
  Administered 2021-07-19: 1 via ORAL
  Filled 2021-07-19: qty 1

## 2021-07-19 MED ORDER — SODIUM CHLORIDE 0.9 % IV SOLN
500.0000 mg | INTRAVENOUS | Status: DC
  Administered 2021-07-19: 500 mg via INTRAVENOUS
  Filled 2021-07-19 (×2): qty 500

## 2021-07-19 MED ORDER — ASPIRIN EC 81 MG PO TBEC
81.0000 mg | DELAYED_RELEASE_TABLET | Freq: Every day | ORAL | Status: DC
  Administered 2021-07-19 – 2021-07-20 (×2): 81 mg via ORAL
  Filled 2021-07-19 (×3): qty 1

## 2021-07-19 NOTE — Progress Notes (Signed)
Pt arrived on the unit. A/Ox4, 4L . Regular breathing, has severe thoracic deformity. Very weak to stand up to use BSC. Asking for suppository. PO PRN was given for constipation. Passing gas, Active bowel sounds. Bed in low position, alarms are on, call bell in reach. Belongings at the bedside.  Will call family with an update

## 2021-07-19 NOTE — H&P (Signed)
History and Physical    [name] ZOX:[id number] DOB: 09-06-1936 DOA: 08/04/2021  PCP: [name], MD   Patient coming from: ALF   Chief Complaint: Increased cough and SOB   HPI: [name] is a pleasant 85 y.o. male with medical history significant for chronic combined systolic and diastolic CHF, paroxysmal atrial fibrillation on Eliquis, hypothyroidism, hypertension, chronic LBBB, and recent admission with acute CHF, discharged to SNF on 07/09/2021 with supplemental oxygen, now returning to the ED with worsening shortness of breath and cough.  After recent discharge, the patient had improved initially, was back on room air, and returned to his assisted living, but then developed worsening shortness of breath and increased cough, was noted to have a weight gain and was given extra Lasix and placed back on supplemental oxygen.  He continued to worsen and was sent to the ED today.  He denies any chest pain or fevers but reports increased cough, mostly nonproductive, and increased shortness of breath.  ED Course: Upon arrival to the ED, patient is found to be afebrile, saturating low to mid 90s on 3 L/min of supplemental oxygen, slightly tachypneic, and with blood pressure 110/55.  EKG features sinus rhythm with chronic LBBB.  Chest x-ray concerning for progressive multifocal pneumonia.  Chemistry panel with sodium 131 and creatinine 2.80.  CBC with new leukocytosis and stable normocytic anemia.  Patient was given 500 cc of saline, cefepime, and azithromycin in the ED.  Review of Systems:  All other systems reviewed and apart from HPI, are negative.  Past Medical History:  Diagnosis Date   Arthritis    Atrial fibrillation (HCC)    Atrial flutter (HCC)    Cardiomyopathy    Nonischemic, LVEF 20%   CHF (congestive heart failure) (HCC)    Chronic renal insufficiency    COPD (chronic obstructive pulmonary disease) ([place])    Fracture of unspecified part of left clavicle, initial  encounter for closed fracture    Hypothyroidism    Left ankle sprain    Left bundle branch block    Mixed hyperlipidemia    Renal failure    Scoliosis     Past Surgical History:  Procedure Laterality Date   EYE SURGERY     LAPAROSCOPIC CHOLECYSTECTOMY  2008   left foot surgery  1998   MASS EXCISION Left 06/26/2016   Procedure: EXCISION Mucoid tumor;  Surgeon: Daryll Brod, MD;  Location: [place];  Service: Orthopedics;  Laterality: Left;  FAB   Right inguinal herniorrhaphy     ROTATION FLAP Left 06/26/2016   Procedure: debridement of distal interphalangeal possible, ROTATION FLAP left index finger;  Surgeon: Daryll Brod, MD;  Location: [facility];  Service: Orthopedics;  Laterality: Left;   SKIN CANCER REMOVED  LEFT EAR     TONSILLECTOMY      Social History:   reports that he has never smoked. He has never used smokeless tobacco. He reports that he does not currently use alcohol. He reports that he does not use drugs.  Allergies  Allergen Reactions   Keflex [Cephalexin] Rash    Rash over whole body    Family History  Problem Relation Age of Onset   Heart disease Father        Died age 22   Stroke Mother        Died age 23   Diabetes Mother    Pancreatic cancer Brother    Arthritis Brother    Throat cancer Brother    Arthritis  Brother    Diabetes Sister    Seizures Sister      Prior to Admission medications   Medication Sig Start Date End Date Taking? Authorizing Provider  amiodarone (PACERONE) 200 MG tablet Take 200 mg by mouth daily.    [provider]  ammonium lactate (AMLACTIN) 12 % cream Apply 1 application topically as needed for dry skin.    [provider]  apixaban (ELIQUIS) 2.5 MG TABS tablet Take 1 tablet (2.5 mg total) by mouth 2 (two) times daily. Please cancel all previous orders for current medication. Change in dosage or pill size. 11/09/18   Bensimhon, Shaune Pascal, MD  aspirin EC 81 MG tablet Take 81 mg by mouth daily.    [provider]  carbamide peroxide (DEBROX) 6.5 % OTIC solution Place 5 drops into the right ear every evening.    [provider]  doxycycline (VIBRA-TABS) 100 MG tablet Take 1 tablet (100 mg total) by mouth 2 (two) times daily. 07/28/2021   Royal Hawthorn, NP  furosemide (LASIX) 40 MG tablet Take 40 mg by mouth daily.    [provider]  furosemide (LASIX) 40 MG tablet Take 1 tablet (40 mg total) by mouth daily. 08/10/2021   Royal Hawthorn, NP  hydrALAZINE (APRESOLINE) 25 MG tablet TAKE 1 TABLET BY MOUTH THREE TIMES DAILY 12/23/17   Bensimhon, Shaune Pascal, MD  hydrocortisone ointment 0.5 % Apply 1 application topically 2 (two) times daily as needed for itching. Right thumb    [provider]  hydrocortisone valerate cream (WESTCORT) 0.2 % Apply 1 application topically 2 (two) times daily. To affected areas of ear, scalp, hair, and nostrils/nose    [provider]  isosorbide mononitrate (IMDUR) 30 MG 24 hr tablet Take 30 mg by mouth daily.    [provider]  ketoconazole (NIZORAL) 2 % cream Apply 1 application topically 2 (two) times daily. To affected areas of ear, scalp, hair, and nostrils/nose    [provider]  ketoconazole (NIZORAL) 2 % shampoo Apply 1 application topically 2 (two) times a week. With hair washing on Wednesday's and Saturday's    [provider]  levothyroxine (SYNTHROID) 150 MCG tablet Take 150 mcg by mouth daily before breakfast.    [provider]  loratadine (CLARITIN) 10 MG tablet Take 10 mg by mouth daily.    [provider]  OXYGEN Inhale 2 L into the lungs as directed. Every Shift; Shift 1, Shift 2, Shift 3    [provider]  polyethylene glycol (MIRALAX / GLYCOLAX) 17 g packet Take 17 g by mouth daily as needed for mild constipation.    [provider]  spironolactone (ALDACTONE) 25 MG tablet TAKE ONE TABLET BY MOUTH ONCE DAILY 02/03/17   Bensimhon, Shaune Pascal, MD    Physical  Exam: Vitals:   07/17/2021 2026 08/03/2021 2054 07/27/2021 2245  BP:  (!) 137/59 (!) 110/55  Pulse:  66 (!) 58  Resp:  (!) 22 20  Temp:  98.9 F (37.2 C) 97.8 F (36.6 C)  TempSrc:  Oral Oral  SpO2: 92% 99% 96%  Weight:   65.8 kg  Height:   5\' 6"  (1.676 m)    Constitutional: NAD, calm  Eyes: PERTLA, lids and conjunctivae normal ENMT: Mucous membranes are moist. Posterior pharynx clear of any exudate or lesions.   Neck: supple, no masses  Respiratory: Tachypnea, coarse rhonchi b/l, no wheezing. No cyanosis.  Cardiovascular: S1 & S2 heard, regular rate and rhythm. Mild  b/l pedal edema.  Abdomen: No distension, no tenderness, soft. Bowel sounds active.  Musculoskeletal: no clubbing / cyanosis. No joint deformity upper and lower extremities.   Skin: no significant rashes, lesions, ulcers. Warm, dry, well-perfused. Neurologic: CN 2-12 grossly intact. Moving all extremities. Alert and oriented.  Psychiatric: Pleasant. Cooperative.    Labs and Imaging on Admission: I have personally reviewed following labs and imaging studies  CBC: Recent Labs  Lab 07/15/21 0000 07/29/2021 2032  WBC 9.4 13.2*  HGB 10.8* 10.3*  HCT 33* 31.8*  MCV  --  97.2  PLT 388 150   Basic Metabolic Panel: Recent Labs  Lab 07/15/21 0000 07/26/2021 2032  NA 140 131*  K 4.8 5.0  CL 103 100  CO2 25* 20*  GLUCOSE  --  161*  BUN 43* 54*  CREATININE 2.2* 2.80*  CALCIUM 8.3* 7.9*   GFR: Estimated Creatinine Clearance: 18.4 mL/min (A) (by C-G formula based on SCr of 2.8 mg/dL (H)). Liver Function Tests: No results for input(s): AST, ALT, ALKPHOS, BILITOT, PROT, ALBUMIN in the last 168 hours. No results for input(s): LIPASE, AMYLASE in the last 168 hours. No results for input(s): AMMONIA in the last 168 hours. Coagulation Profile: No results for input(s): INR, PROTIME in the last 168 hours. Cardiac Enzymes: No results for input(s): CKTOTAL, CKMB, CKMBINDEX, TROPONINI in the last 168 hours. BNP (last 3  results) No results for input(s): PROBNP in the last 8760 hours. HbA1C: No results for input(s): HGBA1C in the last 72 hours. CBG: No results for input(s): GLUCAP in the last 168 hours. Lipid Profile: No results for input(s): CHOL, HDL, LDLCALC, TRIG, CHOLHDL, LDLDIRECT in the last 72 hours. Thyroid Function Tests: No results for input(s): TSH, T4TOTAL, FREET4, T3FREE, THYROIDAB in the last 72 hours. Anemia Panel: No results for input(s): VITAMINB12, FOLATE, FERRITIN, TIBC, IRON, RETICCTPCT in the last 72 hours. Urine analysis:    Component Value Date/Time   COLORURINE STRAW (A) 07/07/2021 0730   APPEARANCEUR CLEAR 07/07/2021 0730   LABSPEC 1.005 07/07/2021 0730   PHURINE 5.0 07/07/2021 0730   GLUCOSEU NEGATIVE 07/07/2021 0730   HGBUR NEGATIVE 07/07/2021 0730   BILIRUBINUR NEGATIVE 07/07/2021 0730   KETONESUR NEGATIVE 07/07/2021 0730   PROTEINUR NEGATIVE 07/07/2021 0730   UROBILINOGEN 0.2 01/24/2012 1315   NITRITE NEGATIVE 07/07/2021 0730   LEUKOCYTESUR NEGATIVE 07/07/2021 0730   Sepsis Labs: @LABRCNTIP (procalcitonin:4,lacticidven:4) ) Recent Results (from the past 240 hour(s))  Resp Panel by RT-PCR (Flu A&B, Covid) Nasopharyngeal Swab     Status: None   Collection Time: 07/28/2021  8:59 PM   Specimen: Nasopharyngeal Swab; Nasopharyngeal(NP) swabs in vial transport medium  Result Value Ref Range Status   SARS Coronavirus 2 by RT PCR NEGATIVE NEGATIVE Final    Comment: (NOTE) SARS-CoV-2 target nucleic acids are NOT DETECTED.  The SARS-CoV-2 RNA is generally detectable in upper respiratory specimens during the acute phase of infection. The lowest concentration of SARS-CoV-2 viral copies this assay can detect is 138 copies/mL. A negative result does not preclude SARS-Cov-2 infection and should not be used as the sole basis for treatment or other patient management decisions. A negative result may occur with  improper specimen collection/handling, submission of specimen  other than nasopharyngeal swab, presence of viral mutation(s) within the areas targeted by this assay, and inadequate number of viral copies(<138 copies/mL). A negative result must be combined with clinical observations, patient history, and epidemiological information. The expected result is Negative.  Fact Sheet for Patients:  EntrepreneurPulse.com.au  Fact Sheet  for Healthcare Providers:  IncredibleEmployment.be  This test is no t yet approved or cleared by the [place] and  has been authorized for detection and/or diagnosis of SARS-CoV-2 by FDA under an Emergency Use Authorization (EUA). This EUA will remain  in effect (meaning this test can be used) for the duration of the COVID-19 declaration under Section 564(b)(1) of the Act, 21 U.S.C.section 360bbb-3(b)(1), unless the authorization is terminated  or revoked sooner.       Influenza A by PCR NEGATIVE NEGATIVE Final   Influenza B by PCR NEGATIVE NEGATIVE Final    Comment: (NOTE) The Xpert Xpress SARS-CoV-2/FLU/RSV plus assay is intended as an aid in the diagnosis of influenza from Nasopharyngeal swab specimens and should not be used as a sole basis for treatment. Nasal washings and aspirates are unacceptable for Xpert Xpress SARS-CoV-2/FLU/RSV testing.  Fact Sheet for Patients: EntrepreneurPulse.com.au  Fact Sheet for Healthcare Providers: IncredibleEmployment.be  This test is not yet approved or cleared by the [place] FDA and has been authorized for detection and/or diagnosis of SARS-CoV-2 by FDA under an Emergency Use Authorization (EUA). This EUA will remain in effect (meaning this test can be used) for the duration of the COVID-19 declaration under Section 564(b)(1) of the Act, 21 U.S.C. section 360bbb-3(b)(1), unless the authorization is terminated or revoked.  Performed at [facility], [place] [address] [place],  [place] [address]      Radiological Exams on Admission: DG Chest 2 View  Result Date: 08/10/2021 CLINICAL DATA:  Cough, progressive hypoxia EXAM: CHEST - 2 VIEW COMPARISON:  07/06/2021, 10/17/2017 FINDINGS: Lung volumes are small and pulmonary insufflation has decreased in the interval since prior examination. Superimposed multifocal pulmonary infiltrate, more focal within the right upper lobe and lung bases bilaterally appears slightly progressive at the lung bases. No pneumothorax. Small bilateral pleural effusions. Cardiomegaly is stable. No acute bone abnormality. IMPRESSION: Progressive multifocal infiltrate possibly reflecting changes of multifocal pneumonia. Small bilateral pleural effusions. Progressive pulmonary hypoinflation. Stable cardiomegaly Electronically Signed   By: Fidela Salisbury M.D.   On: 07/28/2021 21:27    EKG: Independently reviewed. Sinus rhythm, chronic LBBB.   Assessment/Plan   1. Multifocal PNA; acute hypoxic respiratory failure  - Presents with increased cough and SOB despite resuming supplemental O2 and taking extra Lasix  - He has new leukocytosis and CXR findings suspicious for progressive multifocal PNA  - Cefepime and azithromycin started in ED  - Continue cefepime and azithromycin, check MRSA screen and add vancomycin if positive, trend procalcitonin, continue supplemental O2 as needed    2. Chronic systolic & diastolic CHF  - Appears compensated though wt appears to be up and BNP much higher than last month  - Continue home dose Lasix and Aldactone and monitor wt and I/Os - ARB has been on hold since last admission d/t increased creatinine   3. CKD IV  - SCr is 2.80 on admission, up from 2.2 on 07/15/21  - Losartan has been on hold since recent admission  - Renally-dose medications, monitor    4. PAF  - In SR on admission  - Continue amiodarone and Eliquis    5. Hypertension  - Some low BP readings noted in ED, will hold hydralazine initially   6.  Hyponatremia  - Serum sodium is 131 on admission  - Had been given extra Lasix at his facility, was given 500 mL NS bolus in ED  - Appears euvolemic though wt and BNP increased from recent admission  -  Possibly from pneumonia, will monitor    DVT prophylaxis: Eliquis  Code Status: DNR, confirmed on admission  Level of Care: Level of care: Telemetry Medical Family Communication: none present  Disposition Plan:  Patient is from: ALF  Anticipated d/c is to: SNF  Anticipated d/c date is: 07/22/21  Patient currently: Pending improvement in respiratory status  Consults called: none  Admission status: Inpatient     Vianne Bulls, MD [facility]  07/19/2021, 12:43 AM

## 2021-07-19 NOTE — ED Notes (Signed)
Tele tracking in  

## 2021-07-19 NOTE — Progress Notes (Addendum)
[facility]  PROGRESS NOTE  [name] SMO:[id number] DOB: 1936-07-10 DOA: 08/12/2021 PCP: [name], MD   Brief HPI:   85 year old male with medical history of chronic combined systolic and diastolic CHF, paroxysmal atrial fibrillation on Eliquis, hypothyroidism, hypertension, chronic LBBB, recent admission with acute CHF was discharged to skilled nursing facility on 10/25 with supplemental oxygen, return to ED with worsening shortness of breath and cough. In the ED he was noted to have a weight gain, was given extra dose of Lasix.  Chest x-ray was concerning for progressive multifocal pneumonia.  Sodium 131, creatinine 2.80.  Patient was given 500 cc normal saline, cefepime and azithromycin in the ED.    Subjective   Patient seen and examined, denies any shortness of breath.  Currently requiring 3 L/min of oxygen via nasal cannula.   Assessment/Plan:    Acute hypoxic respiratory failure -Likely combination of multifocal pneumonia and acute on chronic systolic and diastolic CHF -Patient started on cefepime and Zithromax -Procalcitonin 0.37 -Diuresed well with Lasix 40 mg p.o. x1 -BNP significantly elevated at 2504 -Continue diuresis  Acute on chronic systolic and diastolic CHF -Patient's last echocardiogram showed EF of 30 to 35% -Also showed grade 2 diastolic dysfunction  -Started on Lasix 40 mg p.o. daily as above -Diuretic use is limited due to worsening renal function -Likely cardiorenal syndrome -We will consult cardiology to help with dosing of diuretics   CKD stage IV -Serum creatinine 2.9 to up from 2.2 on 07/15/2021 -Losartan on hold due to worsening renal function -Patient on diuretics as above -Cardiology consulted for helping with dosing of Lasix  Paroxysmal atrial fibrillation -Patient is in sinus rhythm -Continue amiodarone, Eliquis  Hypertension -Hydralazine on hold -Blood pressure is stable   Hyponatremia -Serum sodium was 131 on  admission -Sodium is 132 this morning -Patient on diuretics as above, will closely follow serum sodium level  Hyperkalemia -Potassium 5.2 -We will give 1 dose of Lokelma 5 g p.o. x1   Scheduled medications:    amiodarone  200 mg Oral Daily   apixaban  2.5 mg Oral BID   aspirin EC  81 mg Oral Daily   furosemide  40 mg Oral Daily   isosorbide mononitrate  30 mg Oral Daily   levothyroxine  150 mcg Oral QAC breakfast   sodium chloride flush  3 mL Intravenous Q12H     Data Reviewed:   CBG:  No results for input(s): GLUCAP in the last 168 hours.  SpO2: (!) 89 % O2 Flow Rate (L/min): 3 L/min FiO2 (%): 88 %    Vitals:   07/19/21 1058 07/19/21 1300 07/19/21 1400 07/19/21 1600  BP: (!) 149/57 (!) 135/59 (!) 148/64 (!) 153/51  Pulse: 60 64 70 67  Resp: (!) 29 (!) 25 (!) 21 (!) 23  Temp:      TempSrc:      SpO2: 92% (!) 86% 94% (!) 89%  Weight:      Height:         Intake/Output Summary (Last 24 hours) at 07/19/2021 1737 Last data filed at 07/19/2021 0035 Gross per 24 hour  Intake 350 ml  Output --  Net 350 ml    11/02 1901 - 11/04 0700 In: 350  Out: -   Filed Weights   08/10/2021 2245  Weight: 65.8 kg    Data Reviewed: Basic Metabolic Panel: Recent Labs  Lab 07/15/21 0000 08/05/2021 2032 07/19/21 0222  NA 140 131* 132*  K 4.8 5.0 5.2*  CL 103 100 101  CO2 25* 20* 21*  GLUCOSE  --  161* 157*  BUN 43* 54* 55*  CREATININE 2.2* 2.80* 2.92*  CALCIUM 8.3* 7.9* 7.8*   Liver Function Tests: No results for input(s): AST, ALT, ALKPHOS, BILITOT, PROT, ALBUMIN in the last 168 hours. No results for input(s): LIPASE, AMYLASE in the last 168 hours. No results for input(s): AMMONIA in the last 168 hours. CBC: Recent Labs  Lab 07/15/21 0000 08/05/2021 2032 07/19/21 0222  WBC 9.4 13.2* 14.2*  HGB 10.8* 10.3* 10.0*  HCT 33* 31.8* 32.8*  MCV  --  97.2 99.7  PLT 388 375 329   Cardiac Enzymes: No results for input(s): CKTOTAL, CKMB, CKMBINDEX, TROPONINI in the  last 168 hours. BNP (last 3 results) Recent Labs    07/06/21 2344 07/16/2021 2108  BNP 901.6* 2,504.3*    ProBNP (last 3 results) No results for input(s): PROBNP in the last 8760 hours.  CBG: No results for input(s): GLUCAP in the last 168 hours.     Radiology Reports  DG Chest 2 View  Result Date: 08/02/2021 CLINICAL DATA:  Cough, progressive hypoxia EXAM: CHEST - 2 VIEW COMPARISON:  07/06/2021, 10/17/2017 FINDINGS: Lung volumes are small and pulmonary insufflation has decreased in the interval since prior examination. Superimposed multifocal pulmonary infiltrate, more focal within the right upper lobe and lung bases bilaterally appears slightly progressive at the lung bases. No pneumothorax. Small bilateral pleural effusions. Cardiomegaly is stable. No acute bone abnormality. IMPRESSION: Progressive multifocal infiltrate possibly reflecting changes of multifocal pneumonia. Small bilateral pleural effusions. Progressive pulmonary hypoinflation. Stable cardiomegaly Electronically Signed   By: Fidela Salisbury M.D.   On: 08/01/2021 21:27       Antibiotics: Anti-infectives (From admission, onward)    Start     Dose/Rate Route Frequency Ordered Stop   07/19/21 2300  ceFEPIme (MAXIPIME) 2 g in sodium chloride 0.9 % 100 mL IVPB        2 g 200 mL/hr over 30 Minutes Intravenous Every 24 hours 08/14/2021 2312 07/23/21 2259   07/19/21 2200  azithromycin (ZITHROMAX) 500 mg in sodium chloride 0.9 % 250 mL IVPB        500 mg 250 mL/hr over 60 Minutes Intravenous Every 24 hours 07/19/21 0042 07/23/21 2159   07/30/2021 2245  ceFEPIme (MAXIPIME) 2 g in sodium chloride 0.9 % 100 mL IVPB        2 g 200 mL/hr over 30 Minutes Intravenous  Once 07/22/2021 2244 07/30/2021 2349   07/24/2021 2245  azithromycin (ZITHROMAX) 500 mg in sodium chloride 0.9 % 250 mL IVPB        500 mg 250 mL/hr over 60 Minutes Intravenous  Once 08/09/2021 2244 07/19/21 0035         DVT prophylaxis: Apixaban  Code Status:  DNR  Family Communication: No family at bedside   Consultants:   Procedures:     Objective    Physical Examination:   General-appears in no acute distress Heart-S1-S2, regular, no murmur auscultated Lungs-decreased breath sounds at lung bases Abdomen-soft, nontender, no organomegaly Extremities-no edema in the lower extremities Neuro-alert, oriented x3, no focal deficit noted  Status is: Inpatient  Dispo: The patient is from: Home              Anticipated d/c is to: Home              Anticipated d/c date is: 07/22/2021  Patient currently not stable for discharge  Barrier to discharge-acute hypoxemic respiratory failure  COVID-19 Labs  No results for input(s): DDIMER, FERRITIN, LDH, CRP in the last 72 hours.  Lab Results  Component Value Date   SARSCOV2NAA NEGATIVE 07/19/2021   SARSCOV2NAA NEGATIVE 07/08/2021   [place] NEGATIVE 07/06/2021   [place] Not Detected 06/23/2019            Recent Results (from the past 240 hour(s))  Resp Panel by RT-PCR (Flu A&B, Covid) Nasopharyngeal Swab     Status: None   Collection Time: 07/31/2021  8:59 PM   Specimen: Nasopharyngeal Swab; Nasopharyngeal(NP) swabs in vial transport medium  Result Value Ref Range Status   SARS Coronavirus 2 by RT PCR NEGATIVE NEGATIVE Final    Comment: (NOTE) SARS-CoV-2 target nucleic acids are NOT DETECTED.  The SARS-CoV-2 RNA is generally detectable in upper respiratory specimens during the acute phase of infection. The lowest concentration of SARS-CoV-2 viral copies this assay can detect is 138 copies/mL. A negative result does not preclude SARS-Cov-2 infection and should not be used as the sole basis for treatment or other patient management decisions. A negative result may occur with  improper specimen collection/handling, submission of specimen other than nasopharyngeal swab, presence of viral mutation(s) within the areas targeted by this assay, and inadequate  number of viral copies(<138 copies/mL). A negative result must be combined with clinical observations, patient history, and epidemiological information. The expected result is Negative.  Fact Sheet for Patients:  EntrepreneurPulse.com.au  Fact Sheet for Healthcare Providers:  IncredibleEmployment.be  This test is no t yet approved or cleared by the [place] FDA and  has been authorized for detection and/or diagnosis of SARS-CoV-2 by FDA under an Emergency Use Authorization (EUA). This EUA will remain  in effect (meaning this test can be used) for the duration of the COVID-19 declaration under Section 564(b)(1) of the Act, 21 U.S.C.section 360bbb-3(b)(1), unless the authorization is terminated  or revoked sooner.       Influenza A by PCR NEGATIVE NEGATIVE Final   Influenza B by PCR NEGATIVE NEGATIVE Final    Comment: (NOTE) The Xpert Xpress SARS-CoV-2/FLU/RSV plus assay is intended as an aid in the diagnosis of influenza from Nasopharyngeal swab specimens and should not be used as a sole basis for treatment. Nasal washings and aspirates are unacceptable for Xpert Xpress SARS-CoV-2/FLU/RSV testing.  Fact Sheet for Patients: EntrepreneurPulse.com.au  Fact Sheet for Healthcare Providers: IncredibleEmployment.be  This test is not yet approved or cleared by the [place] FDA and has been authorized for detection and/or diagnosis of SARS-CoV-2 by FDA under an Emergency Use Authorization (EUA). This EUA will remain in effect (meaning this test can be used) for the duration of the COVID-19 declaration under Section 564(b)(1) of the Act, 21 U.S.C. section 360bbb-3(b)(1), unless the authorization is terminated or revoked.  Performed at [facility], [place] [address] [place], [place] [address]   Blood Culture (routine x 2)     Status: None (Preliminary result)   Collection Time: 07/24/2021 10:08 PM    Specimen: BLOOD  Result Value Ref Range Status   Specimen Description BLOOD SITE NOT SPECIFIED  Final   Special Requests   Final    BOTTLES DRAWN AEROBIC AND ANAEROBIC Blood Culture adequate volume   Culture   Final    NO GROWTH < 12 HOURS Performed at [facility], [place] [address] [place], [place] [address]    Report Status PENDING  Incomplete  Blood  Culture (routine x 2)     Status: None (Preliminary result)   Collection Time: 07/17/2021 11:00 PM   Specimen: BLOOD RIGHT FOREARM  Result Value Ref Range Status   Specimen Description BLOOD RIGHT FOREARM  Final   Special Requests   Final    BOTTLES DRAWN AEROBIC AND ANAEROBIC Blood Culture adequate volume   Culture   Final    NO GROWTH < 12 HOURS Performed at [facility], [place] [address] [place], [place] [address]    Report Status PENDING  Incomplete  MRSA Next Gen by PCR, Nasal     Status: None   Collection Time: 07/19/21  3:15 AM  Result Value Ref Range Status   MRSA by PCR Next Gen NOT DETECTED NOT DETECTED Final    Comment: (NOTE) The GeneXpert MRSA Assay (FDA approved for NASAL specimens only), is one component of a comprehensive MRSA colonization surveillance program. It is not intended to diagnose MRSA infection nor to guide or monitor treatment for MRSA infections. Test performance is not FDA approved in patients less than 73 years old. Performed at [facility], [place] [address] [place], [place] [address]     [facility] If 7PM-7AM, please contact night-coverage at [email or web address], Office  [phone]   07/19/2021, 5:37 PM  LOS: 1 day

## 2021-07-19 NOTE — ED Notes (Addendum)
Patient's oxygen saturations fell, patient did not have nasal cannula situated in his nose correctly. Also noticed patient had more work of breathing, sounded congested. Patient given albuterol treatment.

## 2021-07-19 NOTE — Progress Notes (Signed)
Brother Roger called. Left message

## 2021-07-19 NOTE — ED Notes (Signed)
Dinner ordered 

## 2021-07-19 NOTE — ED Notes (Signed)
Patient in bed eating breakfast, denies any complaints at this time.

## 2021-07-19 NOTE — Consult Note (Signed)
Cardiology Consultation:   Patient ID: [name] MRN: [id number]; DOB: 1936/05/25  Admit date: 08/08/2021 Date of Consult: 07/19/2021  PCP:  [name], MD   [facility] Providers Cardiologist:  None  Advanced Heart Failure:  Glori Bickers, MD       Patient Profile:   [name] is a 85 y.o. male with a hx of nonischemic cardiomyopathy, history of atrial flutter s/p TEE DCCV in 2012, chronic left bundle branch block, CKD stage IV and COPD who is being seen 07/19/2021 for the evaluation of acute on chronic respiratory failure at the request of Dr. Darrick Meigs.  History of Present Illness:   [name] is a 85 year old male with past medical history of nonischemic cardiomyopathy, history of atrial flutter s/p TEE DCCV in 2012, chronic left bundle branch block, CKD stage IV and COPD.  At the time of TEE DCCV via April 2012, his ejection fraction was 15 to 20%.  Cardiac catheterization at the time showed nonobstructive CAD.  He underwent successful cardioversion and was started on amiodarone therapy.  He is not on beta-blocker due to bradycardia.  Given his left bundle branch block, there was discussion regarding biventricular ICD in the past however this was deferred.  By November 2020, EF has improved to 40 to 45% on echocardiogram.  Patient was last seen by heart failure service in June 2022 at which time he was doing well.  More recently, patient was admitted to the hospital on 07/06/2021 with increasing dyspnea.  He was initially placed on 40 mg twice daily of IV Lasix with good urinary output.  Echocardiogram obtained on 07/07/2021 showed EF 30 to 35%, regional wall motion abnormality with akinesis/dyskinesis of the entire septum, out of proportion to dyssynchrony from left bundle branch block, grade 2 diastolic dysfunction, moderate biatrial enlargement, mildly reduced RV EF, mild MR, mild to moderate AI.  Heart failure service took over the care of the patient on 10/24,  his creatinine trended up to 2.7 at the time.  Patient has put out about 2.6 L by that point.  Based on REDs clip interrogation, it was felt the patient was actually dry.  SGLT2 inhibitor was taken off due to volume depletion.  Losartan was stopped due to bump in the creatinine.  Patient eventually was discharged back to [facility] on 07/09/2021 with instruction to restart all home heart failure medication except for losartan.  Discharge weight was 58.9 kg which is 129.85 pounds.  Talking with the patient, his shortness of breath never fully improved and continued to deteriorate since he left the hospital.  He ultimately returned back to the [facility] on 07/28/2021.  On arrival, he was afebrile.  Chest x-ray concerning for possible multifocal pneumonia.  Sodium low at 131.  Creatinine elevated at 2.8.  Patient was treated with IV fluid, cefepime and azithromycin in the emergency room.  BNP was elevated at 2504, compared to 901 from 2 weeks ago.  White blood cell count borderline elevated at 13.2.  Blood cultures have been obtained and is currently pending.  Admission weight was 145 pounds which is 15 pounds heavier than the previous discharge weight.  Cardiology service was consulted for possibility of acute heart failure exacerbation.  Patient is currently receiving both IV antibiotic.  Unfortunately he is a very poor historian and cannot give too much information regarding his current symptoms.  I am unable to check his neck for JVD.  He does not have any lower extremity edema.  He has diffuse crackles throughout bilateral lungs.   Past Medical History:  Diagnosis Date   Arthritis    Atrial fibrillation (HCC)    Atrial flutter (HCC)    Cardiomyopathy    Nonischemic, LVEF 20%   CHF (congestive heart failure) (HCC)    Chronic renal insufficiency    COPD (chronic obstructive pulmonary disease) ([place])    Fracture of unspecified part of left clavicle, initial encounter for closed  fracture    Hypothyroidism    Left ankle sprain    Left bundle branch block    Mixed hyperlipidemia    Renal failure    Scoliosis     Past Surgical History:  Procedure Laterality Date   EYE SURGERY     LAPAROSCOPIC CHOLECYSTECTOMY  2008   left foot surgery  1998   MASS EXCISION Left 06/26/2016   Procedure: EXCISION Mucoid tumor;  Surgeon: Daryll Brod, MD;  Location: [facility];  Service: Orthopedics;  Laterality: Left;  FAB   Right inguinal herniorrhaphy     ROTATION FLAP Left 06/26/2016   Procedure: debridement of distal interphalangeal possible, ROTATION FLAP left index finger;  Surgeon: Daryll Brod, MD;  Location: [facility];  Service: Orthopedics;  Laterality: Left;   SKIN CANCER REMOVED  LEFT EAR     TONSILLECTOMY       Home Medications:  Prior to Admission medications   Medication Sig Start Date End Date Taking? Authorizing Provider  amiodarone (PACERONE) 200 MG tablet Take 200 mg by mouth daily.   Yes [provider]  apixaban (ELIQUIS) 2.5 MG TABS tablet Take 1 tablet (2.5 mg total) by mouth 2 (two) times daily. Please cancel all previous orders for current medication. Change in dosage or pill size. 11/09/18  Yes Bensimhon, Shaune Pascal, MD  aspirin EC 81 MG tablet Take 81 mg by mouth daily.   Yes [provider]  carbamide peroxide (DEBROX) 6.5 % OTIC solution Place 5 drops into the right ear every evening.   Yes [provider]  cefdinir (OMNICEF) 300 MG capsule Take 300 mg by mouth 2 (two) times daily. 07/26/2021 07/25/21 Yes [provider]  doxycycline (VIBRA-TABS) 100 MG tablet Take 1 tablet (100 mg total) by mouth 2 (two) times daily. 07/30/2021  Yes Royal Hawthorn, NP  furosemide (LASIX) 40 MG tablet Take 1 tablet (40 mg total) by mouth daily. Patient taking differently: Take 40 mg by mouth daily. Take 1 tablet extra if needed. 07/20/2021  Yes Wert, Margreta Journey, NP  hydrALAZINE (APRESOLINE) 25 MG tablet TAKE 1 TABLET BY MOUTH THREE TIMES DAILY Patient  taking differently: Take 25 mg by mouth 3 (three) times daily. 12/23/17  Yes Bensimhon, Shaune Pascal, MD  isosorbide mononitrate (IMDUR) 30 MG 24 hr tablet Take 30 mg by mouth daily.   Yes [provider]  ketoconazole (NIZORAL) 2 % cream Apply 1 application topically 2 (two) times daily. To affected areas of ear, scalp, hair, and nostrils/nose   Yes [provider]  ketoconazole (NIZORAL) 2 % shampoo Apply 1 application topically 2 (two) times a week. With hair washing on Wednesday's and Saturday's   Yes [provider]  levothyroxine (SYNTHROID) 150 MCG tablet Take 150 mcg by mouth daily before breakfast.   Yes [provider]  loratadine (CLARITIN) 10 MG tablet Take 10 mg by mouth daily.   Yes [provider]  polyethylene glycol (MIRALAX / GLYCOLAX) 17 g packet Take 17 g by mouth daily as needed for mild constipation.   Yes [provider]  spironolactone (ALDACTONE) 25 MG tablet TAKE ONE TABLET BY MOUTH ONCE DAILY Patient taking differently: Take 25 mg by mouth daily. 02/03/17  Yes Bensimhon, Shaune Pascal, MD  OXYGEN Inhale 2 L into the lungs as directed. Every Shift; Shift 1, Shift 2, Shift 3    [provider]    Inpatient Medications: Scheduled Meds:  amiodarone  200 mg Oral Daily   apixaban  2.5 mg Oral BID   aspirin EC  81 mg Oral Daily   furosemide  40 mg Oral Daily   isosorbide mononitrate  30 mg Oral Daily   levothyroxine  150 mcg Oral QAC breakfast   sodium chloride flush  3 mL Intravenous Q12H   Continuous Infusions:  azithromycin     ceFEPime (MAXIPIME) IV     PRN Meds: acetaminophen **OR** acetaminophen, albuterol, senna-docusate  Allergies:    Allergies  Allergen Reactions   Keflex [Cephalexin] Rash    Rash over whole body    Social History:   Social History   Socioeconomic History   Marital status: Married    Spouse name: Not on file   Number of children: Not on file   Years of education: Not on file    Highest education level: Not on file  Occupational History   Occupation: Retired    Comment: Previously worked at [employer] Use   Smoking status: Never   Smokeless tobacco: Never  Scientific laboratory technician Use: Never used  Substance and Sexual Activity   Alcohol use: Not Currently   Drug use: No   Sexual activity: Not on file  Other Topics Concern   Not on file  Social History Narrative   Tobacco use, amount per day now: No      Past tobacco use, amount per day: No      How many years did you use tobacco: No      Alcohol use (drinks per week): No      Diet: Low Salt       Do you drink/eat things with caffeine? Tea      Marital status: Married             What year were you married? 1975      Do you live in a house, apartment, assisted living, condo, trailer?      Is it one or more stories?      How many persons live in your home?      Do you have any pets in your home?      Current or past profession?      Do you exercise?             How often?      Do you have a living will? Yes      Do you have a DNR form? Yes           If not, do you want to discuss one?      Do you have signed POA/HPOA forms? Yes              Social Determinants of Health   Financial Resource Strain: Not on file  Food Insecurity: Not on file  Transportation Needs: Not on file  Physical Activity: Not on file  Stress: Not on file  Social Connections: Not on file  Intimate Partner Violence: Not on file    Family History:    Family History  Problem Relation Age of Onset   Heart  disease Father        Died age 16   Stroke Mother        Died age 69   Diabetes Mother    Pancreatic cancer Brother    Arthritis Brother    Throat cancer Brother    Arthritis Brother    Diabetes Sister    Seizures Sister      ROS:  Please see the history of present illness.   All other ROS reviewed and negative.     Physical Exam/Data:   Vitals:   07/19/21 1400 07/19/21 1600 07/19/21 1700  07/19/21 1745  BP: (!) 148/64 (!) 153/51 (!) 121/52   Pulse: 70 67 62 (!) 58  Resp: (!) 21 (!) 23 (!) 26 (!) 26  Temp:      TempSrc:      SpO2: 94% (!) 89% (!) 89% 93%  Weight:      Height:        Intake/Output Summary (Last 24 hours) at 07/19/2021 1749 Last data filed at 07/19/2021 0035 Gross per 24 hour  Intake 350 ml  Output --  Net 350 ml   Last 3 Weights 08/11/2021 07/22/2021 07/16/2021  Weight (lbs) 145 lb 132 lb 132 lb  Weight (kg) 65.772 kg 59.875 kg 59.875 kg     Body mass index is 23.4 kg/m.  General: Ill appearing, curled up into a ball  HEENT: normal Neck: no JVD Vascular: No carotid bruits; Distal pulses 2+ bilaterally Cardiac:  normal S1, S2; RRR; no murmur  Lungs: Bilateral significant crackles on exam from the base to the top Abd: soft, nontender, no hepatomegaly  Ext: no edema Musculoskeletal:  No deformities, BUE and BLE strength normal and equal Skin: warm and dry  Neuro:  CNs 2-12 intact, no focal abnormalities noted Psych:  Normal affect   EKG:  The EKG was personally reviewed and demonstrates: Sinus rhythm with left bundle branch block Telemetry:  Telemetry was personally reviewed and demonstrates: Sinus rhythm, left bundle branch block, no significant ventricular ectopy.  Relevant CV Studies:  Echo 07/07/2021 1. Left ventricular ejection fraction, by estimation, is 30 to 35%. The  left ventricle has moderately decreased function. The left ventricle  demonstrates regional wall motion abnormalities (see scoring  diagram/findings for description). The left  ventricular internal cavity size was mildly dilated. Left ventricular  diastolic parameters are consistent with Grade II diastolic dysfunction  (pseudonormalization). Elevated left ventricular end-diastolic pressure.   2. Right ventricular systolic function is mildly reduced. The right  ventricular size is normal.   3. Left atrial size was moderately dilated.   4. Right atrial size was mild to  moderately dilated.   5. The mitral valve is normal in structure. Mild mitral valve  regurgitation. No evidence of mitral stenosis.   6. The aortic valve was not well visualized. Aortic valve regurgitation  is mild to moderate.   Comparison(s): Changes from prior study are noted.   Conclusion(s)/Recommendation(s): EF reduced compared to prior. Global  hypokinesis with akinesis/dyskinesis of the entire septum, out of  proportion to dyssychrony from LBBB.   Laboratory Data:  High Sensitivity Troponin:   Recent Labs  Lab 07/06/21 2344 07/07/21 0130  TROPONINIHS 42* 47*     Chemistry Recent Labs  Lab 07/15/21 0000 08/05/2021 2032 07/19/21 0222  NA 140 131* 132*  K 4.8 5.0 5.2*  CL 103 100 101  CO2 25* 20* 21*  GLUCOSE  --  161* 157*  BUN 43* 54* 55*  CREATININE 2.2*  2.80* 2.92*  CALCIUM 8.3* 7.9* 7.8*  GFRNONAA  --  22* 21*  ANIONGAP  --  11 10    No results for input(s): PROT, ALBUMIN, AST, ALT, ALKPHOS, BILITOT in the last 168 hours. Lipids No results for input(s): CHOL, TRIG, HDL, LABVLDL, LDLCALC, CHOLHDL in the last 168 hours.  Hematology Recent Labs  Lab 07/15/21 0000 08/05/2021 2032 07/19/21 0222  WBC 9.4 13.2* 14.2*  RBC 3.43* 3.27* 3.29*  HGB 10.8* 10.3* 10.0*  HCT 33* 31.8* 32.8*  MCV  --  97.2 99.7  MCH  --  31.5 30.4  MCHC  --  32.4 30.5  RDW  --  13.2 13.5  PLT 388 375 329   Thyroid No results for input(s): TSH, FREET4 in the last 168 hours.  BNP Recent Labs  Lab 08/14/2021 2108  BNP 2,504.3*    DDimer No results for input(s): DDIMER in the last 168 hours.   Radiology/Studies:  DG Chest 2 View  Result Date: 08/07/2021 CLINICAL DATA:  Cough, progressive hypoxia EXAM: CHEST - 2 VIEW COMPARISON:  07/06/2021, 10/17/2017 FINDINGS: Lung volumes are small and pulmonary insufflation has decreased in the interval since prior examination. Superimposed multifocal pulmonary infiltrate, more focal within the right upper lobe and lung bases bilaterally  appears slightly progressive at the lung bases. No pneumothorax. Small bilateral pleural effusions. Cardiomegaly is stable. No acute bone abnormality. IMPRESSION: Progressive multifocal infiltrate possibly reflecting changes of multifocal pneumonia. Small bilateral pleural effusions. Progressive pulmonary hypoinflation. Stable cardiomegaly Electronically Signed   By: Fidela Salisbury M.D.   On: 08/03/2021 21:27     Assessment and Plan:   Acute on chronic respiratory failure: Patient is currently receiving dual antibiotic therapy for possible multifocal pneumonia.  White blood cell count on arrival was 13 which is higher than the previous white blood cell count of 9.  BNP increased from the previous 900 up to 2500.  Chest x-ray shows significant opacity in bilateral lungs.  Differential diagnosis include acute on chronic systolic heart failure, multifocal pneumonia, and amiodarone toxicity.  Given the 15 pound weight gain and significantly elevated proBNP, heart failure is more likely if not superimposed on top of pneumonia.  Unfortunately patient's body habitus does not allow me to do a accurate assessment based on physical exam.   Acute on chronic systolic heart failure  -Patient is quite ill and at high risk for clinical decompensation.  Recommend admit to either progressive unit or cardiac ICU.  Need close observation by RT.  -We will discuss with MD, likely will give a dose of 40 IV Lasix for now.  Hold home blood pressure medication.   -Heart failure service to see tomorrow. REDs clip tomorrow.  If he does not respond to the diuretic overnight, either consider right heart cath or palliative/hospice care tomorrow  Nonischemic cardiomyopathy: EF as low as 15% in 2012, improved to 45% in 2020, most recent echocardiogram shows EF is now down to 35%.  Atrial flutter s/p TEE DCCV in 2012: No recurrence.  On Eliquis and amiodarone.  Not on beta-blocker due to bradycardia.  Chronic left bundle branch  block  Acute on CKD stage IV: Recent discharge creatinine was 2.2, returned back to the hospital with creatinine of 2.8.  COPD    Risk Assessment/Risk Scores:    New York Heart Association (NYHA) Functional Class NYHA Class IV        For questions or updates, please contact [facility] Please consult [email or web address] for contact info under  Hilbert Corrigan, [place]  07/19/2021 5:49 PM

## 2021-07-19 NOTE — ED Notes (Signed)
Patient resting comfortably with eyes closed, in NAD.

## 2021-07-19 NOTE — ED Notes (Signed)
FiO2 increased to 4 liters

## 2021-07-20 DIAGNOSIS — Z515 Encounter for palliative care: Secondary | ICD-10-CM

## 2021-07-20 DIAGNOSIS — J189 Pneumonia, unspecified organism: Secondary | ICD-10-CM | POA: Diagnosis not present

## 2021-07-20 DIAGNOSIS — R570 Cardiogenic shock: Secondary | ICD-10-CM | POA: Diagnosis not present

## 2021-07-20 DIAGNOSIS — J449 Chronic obstructive pulmonary disease, unspecified: Secondary | ICD-10-CM | POA: Diagnosis not present

## 2021-07-20 DIAGNOSIS — N179 Acute kidney failure, unspecified: Secondary | ICD-10-CM | POA: Diagnosis not present

## 2021-07-20 DIAGNOSIS — Z66 Do not resuscitate: Secondary | ICD-10-CM | POA: Diagnosis not present

## 2021-07-20 DIAGNOSIS — I5023 Acute on chronic systolic (congestive) heart failure: Secondary | ICD-10-CM | POA: Diagnosis not present

## 2021-07-20 DIAGNOSIS — N184 Chronic kidney disease, stage 4 (severe): Secondary | ICD-10-CM | POA: Diagnosis not present

## 2021-07-20 DIAGNOSIS — J9621 Acute and chronic respiratory failure with hypoxia: Secondary | ICD-10-CM | POA: Diagnosis not present

## 2021-07-20 DIAGNOSIS — I5043 Acute on chronic combined systolic (congestive) and diastolic (congestive) heart failure: Secondary | ICD-10-CM | POA: Diagnosis not present

## 2021-07-20 LAB — CBC
HCT: 32 % — ABNORMAL LOW (ref 39.0–52.0)
Hemoglobin: 10.2 g/dL — ABNORMAL LOW (ref 13.0–17.0)
MCH: 30.8 pg (ref 26.0–34.0)
MCHC: 31.9 g/dL (ref 30.0–36.0)
MCV: 96.7 fL (ref 80.0–100.0)
Platelets: 312 10*3/uL (ref 150–400)
RBC: 3.31 MIL/uL — ABNORMAL LOW (ref 4.22–5.81)
RDW: 13.6 % (ref 11.5–15.5)
WBC: 21.7 10*3/uL — ABNORMAL HIGH (ref 4.0–10.5)
nRBC: 0 % (ref 0.0–0.2)

## 2021-07-20 LAB — COMPREHENSIVE METABOLIC PANEL
ALT: 2350 U/L — ABNORMAL HIGH (ref 0–44)
AST: 4084 U/L — ABNORMAL HIGH (ref 15–41)
Albumin: 2.3 g/dL — ABNORMAL LOW (ref 3.5–5.0)
Alkaline Phosphatase: 185 U/L — ABNORMAL HIGH (ref 38–126)
Anion gap: 15 (ref 5–15)
BUN: 90 mg/dL — ABNORMAL HIGH (ref 8–23)
CO2: 18 mmol/L — ABNORMAL LOW (ref 22–32)
Calcium: 8.3 mg/dL — ABNORMAL LOW (ref 8.9–10.3)
Chloride: 99 mmol/L (ref 98–111)
Creatinine, Ser: 4.98 mg/dL — ABNORMAL HIGH (ref 0.61–1.24)
GFR, Estimated: 11 mL/min — ABNORMAL LOW (ref >60)
Glucose, Bld: 155 mg/dL — ABNORMAL HIGH (ref 70–99)
Potassium: 5.7 mmol/L — ABNORMAL HIGH (ref 3.5–5.1)
Sodium: 132 mmol/L — ABNORMAL LOW (ref 135–145)
Total Bilirubin: 1.5 mg/dL — ABNORMAL HIGH (ref 0.3–1.2)
Total Protein: 6.8 g/dL (ref 6.5–8.1)

## 2021-07-20 LAB — LEGIONELLA PNEUMOPHILA SEROGP 1 UR AG: L. pneumophila Serogp 1 Ur Ag: NEGATIVE

## 2021-07-20 LAB — LACTIC ACID, PLASMA: Lactic Acid, Venous: 3.1 mmol/L (ref 0.5–1.9)

## 2021-07-20 LAB — BASIC METABOLIC PANEL
Anion gap: 12 (ref 5–15)
BUN: 80 mg/dL — ABNORMAL HIGH (ref 8–23)
CO2: 18 mmol/L — ABNORMAL LOW (ref 22–32)
Calcium: 8.2 mg/dL — ABNORMAL LOW (ref 8.9–10.3)
Chloride: 101 mmol/L (ref 98–111)
Creatinine, Ser: 4.61 mg/dL — ABNORMAL HIGH (ref 0.61–1.24)
GFR, Estimated: 12 mL/min — ABNORMAL LOW (ref >60)
Glucose, Bld: 153 mg/dL — ABNORMAL HIGH (ref 70–99)
Potassium: 5.9 mmol/L — ABNORMAL HIGH (ref 3.5–5.1)
Sodium: 131 mmol/L — ABNORMAL LOW (ref 135–145)

## 2021-07-20 LAB — PROCALCITONIN: Procalcitonin: 2.3 ng/mL

## 2021-07-20 MED ORDER — POLYVINYL ALCOHOL 1.4 % OP SOLN
1.0000 [drp] | Freq: Four times a day (QID) | OPHTHALMIC | Status: DC | PRN
  Filled 2021-07-20: qty 15

## 2021-07-20 MED ORDER — ONDANSETRON 4 MG PO TBDP: 4.0000 mg | ORAL_TABLET | Freq: Four times a day (QID) | ORAL | Status: DC | PRN

## 2021-07-20 MED ORDER — HYDROMORPHONE BOLUS VIA INFUSION: 0.5000 mg | INTRAVENOUS | Status: DC

## 2021-07-20 MED ORDER — DOBUTAMINE INFUSION FOR EP/ECHO/NUC (1000 MCG/ML)
2.5000 ug/kg/min | INTRAVENOUS | Status: DC
  Filled 2021-07-20: qty 250

## 2021-07-20 MED ORDER — ONDANSETRON HCL 4 MG/2ML IJ SOLN: 4.0000 mg | Freq: Four times a day (QID) | INTRAMUSCULAR | Status: DC | PRN

## 2021-07-20 MED ORDER — BISACODYL 10 MG RE SUPP
10.0000 mg | Freq: Once | RECTAL | Status: AC
  Administered 2021-07-20: 10 mg via RECTAL
  Filled 2021-07-20: qty 1

## 2021-07-20 MED ORDER — LEVOTHYROXINE SODIUM 75 MCG PO TABS
150.0000 ug | ORAL_TABLET | Freq: Every day | ORAL | Status: DC
  Filled 2021-07-20: qty 2

## 2021-07-20 MED ORDER — HALOPERIDOL LACTATE 5 MG/ML IJ SOLN: 0.5000 mg | INTRAMUSCULAR | Status: DC | PRN

## 2021-07-20 MED ORDER — GLYCOPYRROLATE 0.2 MG/ML IJ SOLN: 0.2000 mg | INTRAMUSCULAR | Status: DC | PRN

## 2021-07-20 MED ORDER — FUROSEMIDE 10 MG/ML IJ SOLN
160.0000 mg | Freq: Once | INTRAVENOUS | Status: AC
  Administered 2021-07-20: 160 mg via INTRAVENOUS
  Filled 2021-07-20: qty 10

## 2021-07-20 MED ORDER — POLYETHYLENE GLYCOL 3350 17 G PO PACK
17.0000 g | PACK | Freq: Every day | ORAL | Status: DC
  Administered 2021-07-20: 17 g via ORAL
  Filled 2021-07-20: qty 1

## 2021-07-20 MED ORDER — HALOPERIDOL LACTATE 2 MG/ML PO CONC
0.5000 mg | ORAL | Status: DC | PRN
  Filled 2021-07-20: qty 0.3

## 2021-07-20 MED ORDER — GLYCOPYRROLATE 1 MG PO TABS: 1.0000 mg | ORAL_TABLET | ORAL | Status: DC | PRN

## 2021-07-20 MED ORDER — SODIUM CHLORIDE 0.9 % IV SOLN
0.5000 mg/h | INTRAVENOUS | Status: DC
  Administered 2021-07-20: 0.5 mg/h via INTRAVENOUS
  Filled 2021-07-20: qty 2.5

## 2021-07-20 MED ORDER — DOBUTAMINE IN D5W 4-5 MG/ML-% IV SOLN
2.5000 ug/kg/min | INTRAVENOUS | Status: DC
  Filled 2021-07-20: qty 250

## 2021-07-20 MED ORDER — SODIUM ZIRCONIUM CYCLOSILICATE 5 G PO PACK
5.0000 g | PACK | Freq: Two times a day (BID) | ORAL | Status: DC
  Filled 2021-07-20: qty 1

## 2021-07-20 MED ORDER — HYDROMORPHONE BOLUS VIA INFUSION
0.5000 mg | INTRAVENOUS | Status: DC | PRN
  Administered 2021-07-20: 0.5 mg via INTRAVENOUS
  Filled 2021-07-20: qty 1

## 2021-07-20 MED ORDER — BIOTENE DRY MOUTH MT LIQD: 15.0000 mL | OROMUCOSAL | Status: DC | PRN

## 2021-07-20 MED ORDER — HALOPERIDOL 1 MG PO TABS: 0.5000 mg | ORAL_TABLET | ORAL | Status: DC | PRN

## 2021-07-20 MED ORDER — HYDROMORPHONE HCL 1 MG/ML IJ SOLN
0.5000 mg | INTRAMUSCULAR | Status: DC | PRN
Start: 2021-07-20 — End: 2021-07-20
  Filled 2021-07-20: qty 1

## 2021-07-20 MED ORDER — LORAZEPAM 2 MG/ML IJ SOLN: 0.5000 mg | INTRAMUSCULAR | Status: DC | PRN

## 2021-07-20 NOTE — Consult Note (Addendum)
Palliative Medicine Inpatient Consult Note  Consulting Provider: Oswald Hillock, MD  Reason for consult:   [facility] Consult  Reason for Consult? Goals of care   HPI:  Per intake H&P --> 85 year old male with medical history of chronic combined systolic and diastolic CHF, paroxysmal atrial fibrillation on Eliquis, hypothyroidism, hypertension, chronic LBBB, recent admission with acute CHF was discharged to skilled nursing facility on 10/25 with supplemental oxygen, return to ED with worsening shortness of breath and cough.  Palliative care was consulted in the setting of cardiogenic shock to have goals of care conversations.  Clinical Assessment/Goals of Care:  *Please note that this is a verbal dictation therefore any spelling or grammatical errors are due to the "[place] [facility]" system interpretation.  I have reviewed medical records including EPIC notes, labs and imaging, received report from bedside RN, assessed the patient who is alert and oriented to person place time and situation.  He appears to be breathing heavily.   I met with Rudy Jew to further discuss diagnosis prognosis, GOC, EOL wishes, disposition and options.  I introduced Palliative Medicine as specialized medical care for people living with serious illness. It focuses on providing relief from the symptoms and stress of a serious illness. The goal is to improve quality of life for both the patient and the family.  [name] lives in [place], [place] at [place]. He had been fairly well functioning after a recent rehabilitation stay. [place] and I discussed his chronic conditions inclusive of his systolic heart failure, his atrial fibrillation, his hypothyroidism, and his LBBB.  We discussed that he has been admitted to the hospital for having a difficult time breathing and needing additional oxygen. We reviewed that his heart failure is very serious and has caused his  other organs to be in dysfunction   Merel shares with me that he knows he is "going to die".  I asked him to explain to me what he meant by this and he expressed that he realizes his kidneys are no longer functioning and that his life is nearing its end. He states that the doctors have shared this with him.   I asked Ryu if we are truly nearing the end of his life if her wants [place] to continue present measures inclusive of escalating his care to the ICU or rather if we should focus on making him comfortable as he transitions from this life to the next. Dezi very clearly states with myself and his bedside RN, Aram Candela present that he wants to be comfortable.  We talked about transition to comfort measures in house and what that would entail inclusive of medications to control pain, dyspnea, agitation, nausea, itching, and hiccups.  We discussed stopping all uneccessary measures such as cardiac monitoring, blood draws, needle sticks, and frequent vital signs.   We reviewed that due to the breathing difficulty Karston is experiencing we will start a low dose dilaudid gtt to manage symptoms for adequately.   Discussed the importance of continued conversation with family and their  medical providers regarding overall plan of care and treatment options, ensuring decisions are within the context of the patients values and GOCs.  After leaving the room I was able to reach out to Dr. Darrick Meigs, Dr. Haroldine Laws, to verify agreement with the plan for comfort.  I called patients brother Francee Piccolo who plans on coming in with patients other brother and wife. He is aware of the situation. He shares, "there ain't many of  [place] left now". Offer time for him to express his feelings.  __________________________________________ Addendum:  Darden Dates and spoke to the patients RN at the SNF he shares that Dragon was doing well about a week ago though he declined in the last two days.  Nursing messaged to reach out to a Dr.  Donovan Kail at [facility].  I called the nursing supervisor at [phone] who was not familiar with Dr. Donovan Kail.   Decision Maker: Kairyn Olmeda (brother) [phone]  SUMMARY OF RECOMMENDATIONS   DNAR/DNI  Comfort Focused Care  Will start low dose dilaudid gtt to support symptom burden from dyspnea  Unrestricted visitation  Family alerted per patient wishes to come in to support him  Chaplain support requested  Ongoing Palliative support - I anticipate an in hospital death given patients rising Cr and K levels.   Code Status/Advance Care Planning: DNAR/DNI   Palliative Prophylaxis:  Oral Care, Turn Q2H  Additional Recommendations (Limitations, Scope, Preferences): End of life care   Psycho-social/Spiritual:  Desire for further Chaplaincy support: Yes Additional Recommendations: Education on end of life.   Prognosis: Limited hours to days  Discharge Planning: Will be [place].  Vitals:   07/20/21 0633 07/20/21 1442  BP: (!) 113/40 (!) 121/48  Pulse: (!) 50 (!) 56  Resp: 16 20  Temp: 97.7 F (36.5 C) (!) 97.2 F (36.2 C)  SpO2: 98% 93%    Intake/Output Summary (Last 24 hours) at 07/20/2021 1700 Last data filed at 07/20/2021 1600 Gross per 24 hour  Intake 950 ml  Output 150 ml  Net 800 ml   Last Weight  Most recent update: 07/20/2021  5:39 AM    Weight  64.6 kg (142 lb 6.7 oz)            Gen:  Very frail elderly M in moderate distress HEENT: Dry mucous membranes CV: Regular rate and rhythm  PULM: 4LPM [place], crackles bilaterally ABD: soft/nontender  EXT:  (+) trace edema in BLE Neuro: Alert and oriented x3   PPS:   This conversation/these recommendations were discussed with patient primary care team, Dr. Darrick Meigs  Time In: 1620 Time Out: 1733 Total Time: 73 Greater than 50%  of this time was spent counseling and coordinating care related to the above assessment and plan.  [place] Team Team Cell Phone:  [phone] Please utilize secure chat with additional questions, if there is no response within 30 minutes please call the above phone number  Palliative Medicine Team providers are available by phone from 7am to 7pm daily and can be reached through the team cell phone.  Should this patient require assistance outside of these hours, please call the patient's attending physician.

## 2021-07-20 NOTE — Progress Notes (Signed)
Discussed with nephrology, Dr. Jonnie Finner.  He was earlier consulted for acute kidney injury on CKD stage IV.  He says that he noted plan per  cardiology to start dobutamine and IV Lasix.  He has nothing new to offer, he agrees that patient is not a hemodialysis candidate.  Nephrology will not formally consult at this time.

## 2021-07-20 NOTE — Consult Note (Signed)
Advanced Heart Failure Team Consult Note   Primary Physician: Joshua Dad, MD PCP-Cardiologist:  None  Reason for Consultation: Heart failure and AKI  HPI:    [name] is seen today for evaluation of HF and AKI at the request of [name].   [name] is a 85 year old male with past medical history of nonischemic cardiomyopathy, history of atrial flutter s/p TEE DCCV in 2012, chronic left bundle branch block, CKD stage IV and COPD.  At the time of TEE DCCV via April 2012, his ejection fraction was 15 to 20%.  Cardiac catheterization at the time showed nonobstructive CAD.  He underwent successful cardioversion and was started on amiodarone therapy.   Patient was admitted on 07/06/2021 with increasing dyspnea.  He was initially placed on 40 mg twice daily of IV Lasix with good urinary output.  Echocardiogram obtained on 07/07/2021 showed EF 30 to 35% (previously 40-45%). SCr bumped from 2.4 -> 2.7. Diuresed well with IV lasix and ReDs clip showed he was dry. Discharged back to ALF.Discharge weight was 129.85 pounds.   Readmitted 07/31/2021 with recurrent dyspnea. Chest x-ray concerning for possible multifocal pneumonia.  PCT 0.37 -> 2.30. WBC 14.2 -> 21.7.  BNP 900 -> 2,500. Weight 142. Felt to have combination of HF and PNA. Started on IV lasix and abx. SCr on admit 2.9 -> 4.6 today.  He is visibly SOB on exam but his main complaint currently is constipation and says his suppository didn't work this am   Review of Systems: [y] = yes, [ ]  = no   General: Weight gain [ y]; Weight loss [ ] ; Anorexia [ ] ; Fatigue [ ] ; Fever [ ] ; Chills [ ] ; Weakness [ y]  Cardiac: Chest pain/pressure [ ] ; Resting SOB [ y]; Exertional SOB Blue.Reese ]; Orthopnea [ ] ; Pedal Edema [ ] ; Palpitations [ ] ; Syncope [ ] ; Presyncope [ ] ; Paroxysmal nocturnal dyspnea[ ]   Pulmonary: Cough [ ] ; Wheezing[ ] ; Hemoptysis[ ] ; Sputum [ ] ; Snoring [ ]   GI: Vomiting[ ] ; Dysphagia[ ] ; Melena[ ] ; Hematochezia [ ] ;  Heartburn[ ] ; Abdominal pain [ ] ; Constipation Blue.Reese ]; Diarrhea [ ] ; BRBPR [ ]   GU: Hematuria[ ] ; Dysuria [ ] ; Nocturia[ ]   Vascular: Pain in legs with walking [ ] ; Pain in feet with lying flat [ ] ; Non-healing sores [ ] ; Stroke [ ] ; TIA [ ] ; Slurred speech [ ] ;  Neuro: Headaches[ ] ; Vertigo[ ] ; Seizures[ ] ; Paresthesias[ ] ;Blurred vision [ ] ; Diplopia [ ] ; Vision changes [ ]   Ortho/Skin: Arthritis Blue.Reese ]; Joint pain [ y]; Muscle pain [ ] ; Joint swelling [ ] ; Back Pain [ ] ; Rash [ ]   Psych: Depression[ ] ; Anxiety[ ]   Heme: Bleeding problems [ ] ; Clotting disorders [ ] ; Anemia [ ]   Endocrine: Diabetes [ ] ; Thyroid dysfunction[ ]   Home Medications Prior to Admission medications   Medication Sig Start Date End Date Taking? Authorizing Provider  amiodarone (PACERONE) 200 MG tablet Take 200 mg by mouth daily.   Yes [provider]  apixaban (ELIQUIS) 2.5 MG TABS tablet Take 1 tablet (2.5 mg total) by mouth 2 (two) times daily. Please Bowen all previous orders for current medication. Change in dosage or pill size. 11/09/18  Yes [name], Joshua Pascal, MD  aspirin EC 81 MG tablet Take 81 mg by mouth daily.   Yes [provider]  carbamide peroxide (DEBROX) 6.5 % OTIC solution Place 5 drops into the right ear every evening.   Yes [provider]  cefdinir (OMNICEF) 300 MG capsule Take 300 mg by mouth 2 (two) times daily. 07/17/2021 07/25/21 Yes [provider]  doxycycline (VIBRA-TABS) 100 MG tablet Take 1 tablet (100 mg total) by mouth 2 (two) times daily. 08/05/2021  Yes Joshua Hawthorn, [name]  furosemide (LASIX) 40 MG tablet Take 1 tablet (40 mg total) by mouth daily. Patient taking differently: Take 40 mg by mouth daily. Take 1 tablet extra if needed. 08/10/2021  Yes [name], Joshua Journey, [name]  hydrALAZINE (APRESOLINE) 25 MG tablet TAKE 1 TABLET BY MOUTH THREE TIMES DAILY Patient taking differently: Take 25 mg by mouth 3 (three) times daily. 12/23/17  Yes [name], Joshua Pascal, MD   isosorbide mononitrate (IMDUR) 30 MG 24 hr tablet Take 30 mg by mouth daily.   Yes [provider]  ketoconazole (NIZORAL) 2 % cream Apply 1 application topically 2 (two) times daily. To affected areas of ear, scalp, hair, and nostrils/nose   Yes [provider]  ketoconazole (NIZORAL) 2 % shampoo Apply 1 application topically 2 (two) times a week. With hair washing on Wednesday's and Saturday's   Yes [provider]  levothyroxine (SYNTHROID) 150 MCG tablet Take 150 mcg by mouth daily before breakfast.   Yes [provider]  loratadine (CLARITIN) 10 MG tablet Take 10 mg by mouth daily.   Yes [provider]  polyethylene glycol (MIRALAX / GLYCOLAX) 17 g packet Take 17 g by mouth daily as needed for mild constipation.   Yes [provider]  spironolactone (ALDACTONE) 25 MG tablet TAKE ONE TABLET BY MOUTH ONCE DAILY Patient taking differently: Take 25 mg by mouth daily. 02/03/17  Yes [name], Joshua Pascal, MD  OXYGEN Inhale 2 L into the lungs as directed. Every Shift; Shift 1, Shift 2, Shift 3    [provider]    Past Medical History: Past Medical History:  Diagnosis Date   Arthritis    Atrial fibrillation (HCC)    Atrial flutter (HCC)    Cardiomyopathy    Nonischemic, LVEF 20%   CHF (congestive heart failure) (HCC)    Chronic renal insufficiency    COPD (chronic obstructive pulmonary disease) (HCC)    Fracture of unspecified part of left clavicle, initial encounter for closed fracture    Hypothyroidism    Left ankle sprain    Left bundle branch block    Mixed hyperlipidemia    Renal failure    Scoliosis     Past Surgical History: Past Surgical History:  Procedure Laterality Date   EYE SURGERY     LAPAROSCOPIC CHOLECYSTECTOMY  2008   left foot surgery  1998   MASS EXCISION Left 06/26/2016   Procedure: EXCISION Mucoid tumor;  Surgeon: Joshua Brod, MD;  Location: [facility];  Service: Orthopedics;  Laterality: Left;  FAB    Right inguinal herniorrhaphy     ROTATION FLAP Left 06/26/2016   Procedure: debridement of distal interphalangeal possible, ROTATION FLAP left index finger;  Surgeon: Joshua Brod, MD;  Location: [facility];  Service: Orthopedics;  Laterality: Left;   SKIN CANCER REMOVED  LEFT EAR     TONSILLECTOMY      Family History: Family History  Problem Relation Age of Onset   Heart disease Father        Died age 65   Stroke Mother        Died age 52   Diabetes Mother    Pancreatic cancer Brother    Arthritis Brother    Throat cancer Brother  Arthritis Brother    Diabetes Sister    Seizures Sister     Social History: Social History   Socioeconomic History   Marital status: Married    Spouse name: Not on file   Number of children: Not on file   Years of education: Not on file   Highest education level: Not on file  Occupational History   Occupation: Retired    Comment: Previously worked at [employer] Use   Smoking status: Never   Smokeless tobacco: Never  Scientific laboratory technician Use: Never used  Substance and Sexual Activity   Alcohol use: Not Currently   Drug use: No   Sexual activity: Not on file  Other Topics Concern   Not on file  Social History Narrative   Tobacco use, amount per day now: No      Past tobacco use, amount per day: No      How many years did you use tobacco: No      Alcohol use (drinks per week): No      Diet: Low Salt       Do you drink/eat things with caffeine? Tea      Marital status: Married             What year were you married? 1975      Do you live in a house, apartment, assisted living, condo, trailer?      Is it one or more stories?      How many persons live in your home?      Do you have any pets in your home?      Current or past profession?      Do you exercise?             How often?      Do you have a living will? Yes      Do you have a DNR form? Yes           If not, do you want to discuss one?      Do you have  signed POA/HPOA forms? Yes              Social Determinants of Health   Financial Resource Strain: Not on file  Food Insecurity: Not on file  Transportation Needs: Not on file  Physical Activity: Not on file  Stress: Not on file  Social Connections: Not on file    Allergies:  Allergies  Allergen Reactions   Keflex [Cephalexin] Rash    Rash over whole body    Objective:    Vital Signs:   Temp:  [97.7 F (36.5 C)-98.3 F (36.8 C)] 97.7 F (36.5 C) (11/05 7939) Pulse Rate:  [50-70] 50 (11/05 0633) Resp:  [16-26] 16 (11/05 0300) BP: (107-153)/(36-64) 113/40 (11/05 0633) SpO2:  [86 %-98 %] 98 % (11/05 0633) Weight:  [64.6 kg] 64.6 kg (11/05 0500) Last BM Date: 07/17/21  Weight change: Filed Weights   07/17/2021 2245 07/20/21 0500  Weight: 65.8 kg 64.6 kg    Intake/Output:   Intake/Output Summary (Last 24 hours) at 07/20/2021 1241 Last data filed at 07/20/2021 0543 Gross per 24 hour  Intake 590 ml  Output 150 ml  Net 440 ml      Physical Exam    General:  Elderly. Chronically ill appearing.Kyphotic HEENT: normal Neck: supple. JVP hard to see with kyphosis but appears elevated  . Carotids 2+ bilat; no bruits. No lymphadenopathy or thyromegaly appreciated. Cor: PMI  nondisplaced. Regular rate & rhythm. 2/6 MR Lungs: diffuse crackles  Abdomen: soft, nontender, nondistended. No hepatosplenomegaly. No bruits or masses. Good bowel sounds. Extremities: no cyanosis, clubbing, rash, tr edema Neuro: alert & orientedx3, severe speech impediment moves all 4 extremities w/o difficulty. Affect pleasant   Telemetry   Sinus brady 50-60 Personally reviewed  EKG    Sinus 64 1-AVB (235ms) LBBB. Personally reviewed  Labs   Basic Metabolic Panel: Recent Labs  Lab 07/15/21 0000 07/17/2021 2032 07/19/21 0222 07/20/21 0440  NA 140 131* 132* 131*  K 4.8 5.0 5.2* 5.9*  CL 103 100 101 101  CO2 25* 20* 21* 18*  GLUCOSE  --  161* 157* 153*  BUN 43* 54* 55* 80*  CREATININE  2.2* 2.80* 2.92* 4.61*  CALCIUM 8.3* 7.9* 7.8* 8.2*    Liver Function Tests: No results for input(s): AST, ALT, ALKPHOS, BILITOT, PROT, ALBUMIN in the last 168 hours. No results for input(s): LIPASE, AMYLASE in the last 168 hours. No results for input(s): AMMONIA in the last 168 hours.  CBC: Recent Labs  Lab 07/15/21 0000 07/30/2021 2032 07/19/21 0222 07/20/21 0440  WBC 9.4 13.2* 14.2* 21.7*  HGB 10.8* 10.3* 10.0* 10.2*  HCT 33* 31.8* 32.8* 32.0*  MCV  --  97.2 99.7 96.7  PLT 388 375 329 312    Cardiac Enzymes: No results for input(s): CKTOTAL, CKMB, CKMBINDEX, TROPONINI in the last 168 hours.  BNP: BNP (last 3 results) Recent Labs    07/06/21 2344 08/12/2021 2108  BNP 901.6* 2,504.3*    ProBNP (last 3 results) No results for input(s): PROBNP in the last 8760 hours.   CBG: No results for input(s): GLUCAP in the last 168 hours.  Coagulation Studies: No results for input(s): LABPROT, INR in the last 72 hours.   Imaging   No results found.   Medications:     Current Medications:  amiodarone  200 mg Oral Daily   apixaban  2.5 mg Oral BID   aspirin EC  81 mg Oral Daily   isosorbide mononitrate  30 mg Oral Daily   [START ON 08/20/21] levothyroxine  150 mcg Oral Q0600   sodium chloride flush  3 mL Intravenous Q12H    Infusions:  azithromycin Stopped (07/19/21 2354)   ceFEPime (MAXIPIME) IV Stopped (07/20/21 0054)      Assessment/Plan   1. Acute on chronic hypoxic respiratory failure:  - suspect combination of PNA and HF - PCT 2.3, WBC 21K. BNP 900 -> 2500. Weight up 15 pounds.  - Situation now complicated by severe AKI  - Suspect he is heading toward need for CVVHD but I don't think he is a candidate - Would continue IV abx - Increase IV lasix and add dobutamine. See plan below.   2. Acute on chronic systolic heart failure -> cardiogenic shock - due to NICM EF 30-35% by echo 10/22 - weight up 15 pounds.  - Situation now complicated by severe  AKI. Unclear if related to ATN from hypotensions/sepsis or cardiorenal (or both) - not responding to IV lasix - move to cardiac floor. - start empiric dobutamine and high-dose lasix  3. Acute on CKD stage IV:  - Recent discharge creatinine was 2.2, returned back to the hospital with creatinine of 2.8 - > 4.6  - Unclear if related to ATN from hypotension/sepsis or cardiorenal (or both) - Suspect he is heading toward need for CVVHD but I don't think he is a candidate - Plan as above - I spoke to  him about dialysis and he was clear that he would not want this and says he knows he may likely die without it. Need to consider Palliative Care involvement  4. Atrial flutter s/p TEE DCCV in 2012: No recurrence.  On Eliquis and amiodarone.  Not on beta-blocker due to bradycardia.   5. Chronic left bundle branch block - has refused CRT in past.   6. Hyperkalemia - in setting of AKI - can give lokelma - poor candidate for CVVHD   6. DNR/DNI - prognosis quite guarded with shock and multisystem organ failure - I spoke to him about dialysis and he was clear that he would not want this and says he knows he may likely die without it. Need to consider Palliative Care involvement - Need to consider Palliative Care involvement  - I called his brother Francee Piccolo 3x and left VMx2 but unable to reach him.  - I also talked to [facility]  CRITICAL CARE Performed by: Glori Bickers  Total critical care time: 55 minutes  Critical care time was exclusive of separately billable procedures and treating other patients.  Critical care was necessary to treat or prevent imminent or life-threatening deterioration.  Critical care was time spent personally by me (independent of midlevel providers or residents) on the following activities: development of treatment plan with patient and/or surrogate as well as nursing, discussions with consultants, evaluation of patient's response to treatment, examination of patient,  obtaining history from patient or surrogate, ordering and performing treatments and interventions, ordering and review of laboratory studies, ordering and review of radiographic studies, pulse oximetry and re-evaluation of patient's condition.    Length of Stay: 2  Glori Bickers, MD  07/20/2021, 12:41 PM  Advanced Heart Failure Team Pager [phone] (M-F; 7a - 5p)  Please contact [facility] for night-coverage after hours (4p -7a ) and weekends on [email or web address]

## 2021-07-20 NOTE — Progress Notes (Signed)
[facility]  PROGRESS NOTE  [name] ESP:[id number] DOB: September 11, 1936 DOA: 08/04/2021 PCP: [name], MD   Brief HPI:   85 year old male with medical history of chronic combined systolic and diastolic CHF, paroxysmal atrial fibrillation on Eliquis, hypothyroidism, hypertension, chronic LBBB, recent admission with acute CHF was discharged to skilled nursing facility on 10/25 with supplemental oxygen, return to ED with worsening shortness of breath and cough. In the ED he was noted to have a weight gain, was given extra dose of Lasix.  Chest x-ray was concerning for progressive multifocal pneumonia.  Sodium 131, creatinine 2.80.  Patient was given 500 cc normal saline, cefepime and azithromycin in the ED.    Subjective   Patient seen and examined, shortness of breath has improved.  Renal function much worse after starting high-dose IV  Lasix yesterday.   Assessment/Plan:    Acute hypoxic respiratory failure -Likely combination of multifocal pneumonia and acute on chronic systolic and diastolic CHF -Patient started on cefepime and Zithromax -Procalcitonin elevated at 2.30 -Cardiology was consulted yesterday for CHF exacerbation as BNP was significantly elevated at 2504 -Breathing has improved, will hold further Lasix as patient renal function has worsened  Acute on chronic systolic and diastolic CHF -Patient's last echocardiogram showed EF of 30 to 35% -Also showed grade 2 diastolic dysfunction  -Started on Lasix 80 mg IV every 12 hours per cardiology -Likely cardiorenal syndrome -Renal function worse today, will hold Lasix, await cardiology recommendation   CKD stage IV -Serum creatinine was 2.9 to up from 2.2 on 07/15/2021 -Serum creatinine is up to 4.61 today -Hold IV Lasix as above -We will consult nephrology, discussed with Dr. Jonnie Finner, he will see patient today  Hyperkalemia -Potassium elevated at 5.9 -Start Lokelma 5 g p.o. twice daily -Follow BMP in  am  Paroxysmal atrial fibrillation -Patient is in sinus rhythm -Continue amiodarone, Eliquis  Hypertension -Hydralazine on hold -Blood pressure is stable   Hyponatremia -Serum sodium was 131 on admission -Sodium is 132 this morning -Patient on diuretics as above, will closely follow serum sodium level   Scheduled medications:    amiodarone  200 mg Oral Daily   apixaban  2.5 mg Oral BID   aspirin EC  81 mg Oral Daily   [START ON 2021-08-07] levothyroxine  150 mcg Oral Q0600   polyethylene glycol  17 g Oral Daily   sodium chloride flush  3 mL Intravenous Q12H   sodium zirconium cyclosilicate  5 g Oral BID     Data Reviewed:   CBG:  No results for input(s): GLUCAP in the last 168 hours.  SpO2: 93 % O2 Flow Rate (L/min): 4 L/min FiO2 (%): 88 %    Vitals:   07/19/21 2137 07/20/21 0500 07/20/21 0633 07/20/21 1442  BP: (!) 120/36  (!) 113/40 (!) 121/48  Pulse: 60  (!) 50 (!) 56  Resp: 16  16 20   Temp: 98.3 F (36.8 C)  97.7 F (36.5 C) (!) 97.2 F (36.2 C)  TempSrc:      SpO2: 92%  98% 93%  Weight:  64.6 kg    Height:         Intake/Output Summary (Last 24 hours) at 07/20/2021 1505 Last data filed at 07/20/2021 1400 Gross per 24 hour  Intake 710 ml  Output 150 ml  Net 560 ml    11/03 1901 - 11/05 0700 In: 940 [P.O.:240] Out: 150 [Urine:150]  Filed Weights   08/02/2021 2245 07/20/21 0500  Weight: 65.8 kg 64.6  kg    Data Reviewed: Basic Metabolic Panel: Recent Labs  Lab 07/15/21 0000 08/09/2021 2032 07/19/21 0222 07/20/21 0440  NA 140 131* 132* 131*  K 4.8 5.0 5.2* 5.9*  CL 103 100 101 101  CO2 25* 20* 21* 18*  GLUCOSE  --  161* 157* 153*  BUN 43* 54* 55* 80*  CREATININE 2.2* 2.80* 2.92* 4.61*  CALCIUM 8.3* 7.9* 7.8* 8.2*   Liver Function Tests: No results for input(s): AST, ALT, ALKPHOS, BILITOT, PROT, ALBUMIN in the last 168 hours. No results for input(s): LIPASE, AMYLASE in the last 168 hours. No results for input(s): AMMONIA in the last  168 hours. CBC: Recent Labs  Lab 07/15/21 0000 08/03/2021 2032 07/19/21 0222 07/20/21 0440  WBC 9.4 13.2* 14.2* 21.7*  HGB 10.8* 10.3* 10.0* 10.2*  HCT 33* 31.8* 32.8* 32.0*  MCV  --  97.2 99.7 96.7  PLT 388 375 329 312   Cardiac Enzymes: No results for input(s): CKTOTAL, CKMB, CKMBINDEX, TROPONINI in the last 168 hours. BNP (last 3 results) Recent Labs    07/06/21 2344 07/28/2021 2108  BNP 901.6* 2,504.3*    ProBNP (last 3 results) No results for input(s): PROBNP in the last 8760 hours.  CBG: No results for input(s): GLUCAP in the last 168 hours.     Radiology Reports  DG Chest 2 View  Result Date: 07/23/2021 CLINICAL DATA:  Cough, progressive hypoxia EXAM: CHEST - 2 VIEW COMPARISON:  07/06/2021, 10/17/2017 FINDINGS: Lung volumes are small and pulmonary insufflation has decreased in the interval since prior examination. Superimposed multifocal pulmonary infiltrate, more focal within the right upper lobe and lung bases bilaterally appears slightly progressive at the lung bases. No pneumothorax. Small bilateral pleural effusions. Cardiomegaly is stable. No acute bone abnormality. IMPRESSION: Progressive multifocal infiltrate possibly reflecting changes of multifocal pneumonia. Small bilateral pleural effusions. Progressive pulmonary hypoinflation. Stable cardiomegaly Electronically Signed   By: Fidela Salisbury M.D.   On: 08/05/2021 21:27       Antibiotics: Anti-infectives (From admission, onward)    Start     Dose/Rate Route Frequency Ordered Stop   07/19/21 2300  ceFEPIme (MAXIPIME) 2 g in sodium chloride 0.9 % 100 mL IVPB        2 g 200 mL/hr over 30 Minutes Intravenous Every 24 hours 07/16/2021 2312 07/23/21 2259   07/19/21 2200  azithromycin (ZITHROMAX) 500 mg in sodium chloride 0.9 % 250 mL IVPB        500 mg 250 mL/hr over 60 Minutes Intravenous Every 24 hours 07/19/21 0042 07/23/21 2159   08/05/2021 2245  ceFEPIme (MAXIPIME) 2 g in sodium chloride 0.9 % 100 mL IVPB         2 g 200 mL/hr over 30 Minutes Intravenous  Once 07/23/2021 2244 07/22/2021 2349   07/30/2021 2245  azithromycin (ZITHROMAX) 500 mg in sodium chloride 0.9 % 250 mL IVPB        500 mg 250 mL/hr over 60 Minutes Intravenous  Once 07/29/2021 2244 07/19/21 0035         DVT prophylaxis: Apixaban  Code Status: DNR  Family Communication: No family at bedside   Consultants:   Procedures:     Objective    Physical Examination:  General-appears in no acute distress Heart-S1-S2, regular, no murmur auscultated Lungs-bilateral crackles, left more than right  Abdomen-soft, nontender, no organomegaly Extremities-no edema in the lower extremities Neuro-alert, oriented x3, no focal deficit noted  Status is: Inpatient  Dispo: The patient is from: Home  Anticipated d/c is to: Home              Anticipated d/c date is: 07/22/2021              Patient currently not stable for discharge  Barrier to discharge-acute hypoxemic respiratory failure  COVID-19 Labs  No results for input(s): DDIMER, FERRITIN, LDH, CRP in the last 72 hours.  Lab Results  Component Value Date   SARSCOV2NAA NEGATIVE 08/01/2021   SARSCOV2NAA NEGATIVE 07/08/2021   [place] NEGATIVE 07/06/2021   SARSCOV2NAA Not Detected 06/23/2019     Pressure Injury 07/19/21 Coccyx Right;Left;Mid Stage 1 -  Intact skin with non-blanchable redness of a localized area usually over a bony prominence. (Active)  07/19/21 1830  Location: Coccyx  Location Orientation: Right;Left;Mid  Staging: Stage 1 -  Intact skin with non-blanchable redness of a localized area usually over a bony prominence.  Wound Description (Comments):   Present on Admission: Yes        Recent Results (from the past 240 hour(s))  Resp Panel by RT-PCR (Flu A&B, Covid) Nasopharyngeal Swab     Status: None   Collection Time: 08/10/2021  8:59 PM   Specimen: Nasopharyngeal Swab; Nasopharyngeal(NP) swabs in vial transport medium  Result Value  Ref Range Status   SARS Coronavirus 2 by RT PCR NEGATIVE NEGATIVE Final    Comment: (NOTE) SARS-CoV-2 target nucleic acids are NOT DETECTED.  The SARS-CoV-2 RNA is generally detectable in upper respiratory specimens during the acute phase of infection. The lowest concentration of SARS-CoV-2 viral copies this assay can detect is 138 copies/mL. A negative result does not preclude SARS-Cov-2 infection and should not be used as the sole basis for treatment or other patient management decisions. A negative result may occur with  improper specimen collection/handling, submission of specimen other than nasopharyngeal swab, presence of viral mutation(s) within the areas targeted by this assay, and inadequate number of viral copies(<138 copies/mL). A negative result must be combined with clinical observations, patient history, and epidemiological information. The expected result is Negative.  Fact Sheet for Patients:  EntrepreneurPulse.com.au  Fact Sheet for Healthcare Providers:  IncredibleEmployment.be  This test is no t yet approved or cleared by the [place] FDA and  has been authorized for detection and/or diagnosis of SARS-CoV-2 by FDA under an Emergency Use Authorization (EUA). This EUA will remain  in effect (meaning this test can be used) for the duration of the COVID-19 declaration under Section 564(b)(1) of the Act, 21 U.S.C.section 360bbb-3(b)(1), unless the authorization is terminated  or revoked sooner.       Influenza A by PCR NEGATIVE NEGATIVE Final   Influenza B by PCR NEGATIVE NEGATIVE Final    Comment: (NOTE) The Xpert Xpress SARS-CoV-2/FLU/RSV plus assay is intended as an aid in the diagnosis of influenza from Nasopharyngeal swab specimens and should not be used as a sole basis for treatment. Nasal washings and aspirates are unacceptable for Xpert Xpress SARS-CoV-2/FLU/RSV testing.  Fact Sheet for  Patients: EntrepreneurPulse.com.au  Fact Sheet for Healthcare Providers: IncredibleEmployment.be  This test is not yet approved or cleared by the [place] FDA and has been authorized for detection and/or diagnosis of SARS-CoV-2 by FDA under an Emergency Use Authorization (EUA). This EUA will remain in effect (meaning this test can be used) for the duration of the COVID-19 declaration under Section 564(b)(1) of the Act, 21 U.S.C. section 360bbb-3(b)(1), unless the authorization is terminated or revoked.  Performed at [facility], [place],  [place] [address]   Blood Culture (routine x 2)     Status: None (Preliminary result)   Collection Time: 08/05/2021 10:08 PM   Specimen: BLOOD  Result Value Ref Range Status   Specimen Description BLOOD SITE NOT SPECIFIED  Final   Special Requests   Final    BOTTLES DRAWN AEROBIC AND ANAEROBIC Blood Culture adequate volume   Culture   Final    NO GROWTH 2 DAYS Performed at [facility], [address] [place], [place] [address]    Report Status PENDING  Incomplete  Blood Culture (routine x 2)     Status: None (Preliminary result)   Collection Time: 08/04/2021 11:00 PM   Specimen: BLOOD RIGHT FOREARM  Result Value Ref Range Status   Specimen Description BLOOD RIGHT FOREARM  Final   Special Requests   Final    BOTTLES DRAWN AEROBIC AND ANAEROBIC Blood Culture adequate volume   Culture   Final    NO GROWTH 2 DAYS Performed at [facility], [place] [address], [place], [place] [address]    Report Status PENDING  Incomplete  MRSA Next Gen by PCR, Nasal     Status: None   Collection Time: 07/19/21  3:15 AM  Result Value Ref Range Status   MRSA by PCR Next Gen NOT DETECTED NOT DETECTED Final    Comment: (NOTE) The GeneXpert MRSA Assay (FDA approved for NASAL specimens only), is one component of a comprehensive MRSA colonization surveillance program. It is not intended to  diagnose MRSA infection nor to guide or monitor treatment for MRSA infections. Test performance is not FDA approved in patients less than 94 years old. Performed at [facility], [place] [address], [place],  [address]     [facility] If 7PM-7AM, please contact night-coverage at [email or web address], Office  [phone]   07/20/2021, 3:05 PM  LOS: 2 days

## 2021-07-20 NOTE — Progress Notes (Signed)
    Subjective:  Less dyspnea eating breakfast but Cr much worse  Objective:  Vitals:   07/19/21 1830 07/19/21 2137 07/20/21 0500 07/20/21 0633  BP: (!) 107/46 (!) 120/36  (!) 113/40  Pulse: (!) 53 60  (!) 50  Resp: 19 16  16   Temp: 98.3 F (36.8 C) 98.3 F (36.8 C)  97.7 F (36.5 C)  TempSrc:      SpO2: 95% 92%  98%  Weight:   64.6 kg   Height:        Intake/Output from previous day:  Intake/Output Summary (Last 24 hours) at 07/20/2021 0931 Last data filed at 07/20/2021 0543 Gross per 24 hour  Intake 590 ml  Output 150 ml  Net 440 ml    Physical Exam: Frail elderly male  Kyphotic Basilar crackles JVP indeterminate SEM Trace edema BS positive   Lab Results: Basic Metabolic Panel: Recent Labs    07/19/21 0222 07/20/21 0440  NA 132* 131*  K 5.2* 5.9*  CL 101 101  CO2 21* 18*  GLUCOSE 157* 153*  BUN 55* 80*  CREATININE 2.92* 4.61*  CALCIUM 7.8* 8.2*   Liver Function Tests: No results for input(s): AST, ALT, ALKPHOS, BILITOT, PROT, ALBUMIN in the last 72 hours. No results for input(s): LIPASE, AMYLASE in the last 72 hours. CBC: Recent Labs    07/19/21 0222 07/20/21 0440  WBC 14.2* 21.7*  HGB 10.0* 10.2*  HCT 32.8* 32.0*  MCV 99.7 96.7  PLT 329 312     Imaging: DG Chest 2 View  Result Date: 07/29/2021 CLINICAL DATA:  Cough, progressive hypoxia EXAM: CHEST - 2 VIEW COMPARISON:  07/06/2021, 10/17/2017 FINDINGS: Lung volumes are small and pulmonary insufflation has decreased in the interval since prior examination. Superimposed multifocal pulmonary infiltrate, more focal within the right upper lobe and lung bases bilaterally appears slightly progressive at the lung bases. No pneumothorax. Small bilateral pleural effusions. Cardiomegaly is stable. No acute bone abnormality. IMPRESSION: Progressive multifocal infiltrate possibly reflecting changes of multifocal pneumonia. Small bilateral pleural effusions. Progressive pulmonary hypoinflation. Stable  cardiomegaly Electronically Signed   By: Fidela Salisbury M.D.   On: 08/13/2021 21:27    Cardiac Studies:  ECG: SR LBBB    Telemetry:  NSR   Echo: EF 30-35% mild MR mild RV hypokinesis   Medications:    amiodarone  200 mg Oral Daily   apixaban  2.5 mg Oral BID   aspirin EC  81 mg Oral Daily   bisacodyl  10 mg Rectal Once   isosorbide mononitrate  30 mg Oral Daily   [START ON 08-16-21] levothyroxine  150 mcg Oral Q0600   sodium chloride flush  3 mL Intravenous Q12H      azithromycin Stopped (07/19/21 2354)   ceFEPime (MAXIPIME) IV Stopped (07/20/21 0054)    Assessment/Plan:   CHF:  acute on chronic systolic CHF Maintaining NSR with history of TEE/DCC and on renal dose eliquis LBBB but not a candidate for BiV CRT given age He has no history of obstructive CAD. He does not look particularly volume overloaded to me Hold diuretics ? Aspiration on azithromycin and maxipime Have reached out to Dr Jeffie Pollock to round on but does not appear to be ideal candidate for milrinone with age,DNR and acute on chronic renal failure   Jenkins Rouge 07/20/2021, 9:31 AM

## 2021-07-21 DIAGNOSIS — J9621 Acute and chronic respiratory failure with hypoxia: Secondary | ICD-10-CM | POA: Diagnosis not present

## 2021-07-21 DIAGNOSIS — N184 Chronic kidney disease, stage 4 (severe): Secondary | ICD-10-CM | POA: Diagnosis not present

## 2021-07-21 DIAGNOSIS — J189 Pneumonia, unspecified organism: Secondary | ICD-10-CM | POA: Diagnosis not present

## 2021-07-23 LAB — CULTURE, BLOOD (ROUTINE X 2)
Culture: NO GROWTH
Culture: NO GROWTH
Special Requests: ADEQUATE
Special Requests: ADEQUATE

## 2021-07-29 ENCOUNTER — Encounter: Payer: Medicare HMO | Admitting: Adult Health

## 2021-08-07 ENCOUNTER — Encounter: Payer: Self-pay | Admitting: Internal Medicine

## 2021-08-15 NOTE — Progress Notes (Signed)
[facility] night cross cover note:  Notified that this patient on comfort care measures only has passed away.  Existing order that RN may pronounce is confirmed.     Babs Bertin, DO Hospitalist

## 2021-08-15 NOTE — Discharge Summary (Signed)
Death Summary  [name] GYF:[id number] DOB: [birth year] DOA: 05-Aug-2021  PCP: [name], MD PCP/Office notified: yes  Admit date: 08-05-2021 Date of Death: 08-17-2021  Final Diagnoses:  Principal Problem:   Multifocal pneumonia Active Problems:   Chronic systolic heart failure (HCC)   Paroxysmal atrial flutter (HCC)   COPD (chronic obstructive pulmonary disease) (HCC)   CKD (chronic kidney disease) stage 4, GFR 15-29 ml/min (HCC)   Acute respiratory failure with hypoxia (HCC)   Pressure injury of skin      History of present illness:  85 year old male with medical history of chronic combined systolic and diastolic CHF, paroxysmal atrial fibrillation on Eliquis, hypothyroidism, hypertension, chronic LBBB, recent admission with acute CHF was discharged to skilled nursing facility on 10/25 with supplemental oxygen, return to ED with worsening shortness of breath and cough. In the ED he was noted to have a weight gain, was given extra dose of Lasix.  Chest x-ray was concerning for progressive multifocal pneumonia.  Sodium 131, creatinine 2.80.  Patient was given 500 cc normal saline, cefepime and azithromycin in the ED.  Hospital Course:   Acute hypoxemic respiratory failure -Patient presented with acute hypoxemic respiratory failure likely combination of multifocal pneumonia and acute on chronic diastolic and systolic heart failure -Patient was started on cefepime and Zithromax -We will diurese with Lasix -However patient developed worsening renal function and did not want to be dialyzed so Lasix was stopped -Palliative care was consulted -Full comfort measures 6 -He was started on Dilaudid gtt. for symptom support due to dyspnea -Patient expired on 2021-08-08 at 5:33 AM 6  Time: Of death 5:33 AM  Signed:  Oswald Hillock  [facility] 08/17/21, 5:40 PM

## 2021-08-15 NOTE — Progress Notes (Signed)
   Palliative Medicine Inpatient Follow Up Note   Per Chart review, Duron passed away early this morning.  I have messaged his PCP, Dr. Lyndel Safe to inform her of this.  Palliative care will remain available for additional supportive needs.  No charge ______________________________________________________________________________________ [place] Team Team Cell Phone: [phone] Please utilize secure chat with additional questions, if there is no response within 30 minutes please call the above phone number  Palliative Medicine Team providers are available by phone from 7am to 7pm daily and can be reached through the team cell phone.  Should this patient require assistance outside of these hours, please call the patient's attending physician.

## 2021-08-15 DEATH — deceased
